# Patient Record
Sex: Female | Born: 1975
Health system: Southern US, Community
[De-identification: ages and names within clinical notes are randomized; demographics above are authoritative.]

## PROBLEM LIST (undated history)

## (undated) DIAGNOSIS — C439 Malignant melanoma of skin, unspecified: Secondary | ICD-10-CM

## (undated) DIAGNOSIS — E669 Obesity, unspecified: Secondary | ICD-10-CM

## (undated) DIAGNOSIS — M67441 Ganglion, right hand: Secondary | ICD-10-CM

## (undated) DIAGNOSIS — F314 Bipolar disorder, current episode depressed, severe, without psychotic features: Secondary | ICD-10-CM

## (undated) DIAGNOSIS — U071 COVID-19: Secondary | ICD-10-CM

## (undated) DIAGNOSIS — M170 Bilateral primary osteoarthritis of knee: Secondary | ICD-10-CM

## (undated) DIAGNOSIS — F329 Major depressive disorder, single episode, unspecified: Secondary | ICD-10-CM

## (undated) DIAGNOSIS — K047 Periapical abscess without sinus: Secondary | ICD-10-CM

## (undated) DIAGNOSIS — F32A Depression, unspecified: Secondary | ICD-10-CM

## (undated) DIAGNOSIS — L659 Nonscarring hair loss, unspecified: Secondary | ICD-10-CM

## (undated) DIAGNOSIS — G5722 Lesion of femoral nerve, left lower limb: Secondary | ICD-10-CM

## (undated) DIAGNOSIS — F4310 Post-traumatic stress disorder, unspecified: Secondary | ICD-10-CM

## (undated) DIAGNOSIS — M47816 Spondylosis without myelopathy or radiculopathy, lumbar region: Secondary | ICD-10-CM

## (undated) DIAGNOSIS — A749 Chlamydial infection, unspecified: Secondary | ICD-10-CM

## (undated) DIAGNOSIS — M549 Dorsalgia, unspecified: Secondary | ICD-10-CM

## (undated) DIAGNOSIS — R6883 Chills (without fever): Secondary | ICD-10-CM

## (undated) DIAGNOSIS — I10 Essential (primary) hypertension: Secondary | ICD-10-CM

## (undated) DIAGNOSIS — G43909 Migraine, unspecified, not intractable, without status migrainosus: Secondary | ICD-10-CM

## (undated) DIAGNOSIS — F419 Anxiety disorder, unspecified: Secondary | ICD-10-CM

## (undated) DIAGNOSIS — M7711 Lateral epicondylitis, right elbow: Secondary | ICD-10-CM

## (undated) DIAGNOSIS — G8929 Other chronic pain: Secondary | ICD-10-CM

## (undated) DIAGNOSIS — G894 Chronic pain syndrome: Secondary | ICD-10-CM

## (undated) DIAGNOSIS — N943 Premenstrual tension syndrome: Secondary | ICD-10-CM

## (undated) DIAGNOSIS — R87629 Unspecified abnormal cytological findings in specimens from vagina: Secondary | ICD-10-CM

## (undated) DIAGNOSIS — K219 Gastro-esophageal reflux disease without esophagitis: Secondary | ICD-10-CM

## (undated) DIAGNOSIS — R200 Anesthesia of skin: Secondary | ICD-10-CM

## (undated) DIAGNOSIS — A599 Trichomoniasis, unspecified: Secondary | ICD-10-CM

## (undated) DIAGNOSIS — C4491 Basal cell carcinoma of skin, unspecified: Secondary | ICD-10-CM

## (undated) DIAGNOSIS — Z8041 Family history of malignant neoplasm of ovary: Secondary | ICD-10-CM

## (undated) DIAGNOSIS — S52042A Displaced fracture of coronoid process of left ulna, initial encounter for closed fracture: Secondary | ICD-10-CM

## (undated) DIAGNOSIS — N939 Abnormal uterine and vaginal bleeding, unspecified: Secondary | ICD-10-CM

## (undated) DIAGNOSIS — R29898 Other symptoms and signs involving the musculoskeletal system: Secondary | ICD-10-CM

## (undated) DIAGNOSIS — J189 Pneumonia, unspecified organism: Secondary | ICD-10-CM

## (undated) DIAGNOSIS — N83209 Unspecified ovarian cyst, unspecified side: Secondary | ICD-10-CM

## (undated) DIAGNOSIS — IMO0002 Reserved for concepts with insufficient information to code with codable children: Secondary | ICD-10-CM

## (undated) DIAGNOSIS — M67439 Ganglion, unspecified wrist: Secondary | ICD-10-CM

## (undated) DIAGNOSIS — R0789 Other chest pain: Secondary | ICD-10-CM

## (undated) DIAGNOSIS — M1712 Unilateral primary osteoarthritis, left knee: Secondary | ICD-10-CM

## (undated) HISTORY — DX: Unspecified ovarian cyst, unspecified side: N83.209

## (undated) HISTORY — DX: Chlamydial infection, unspecified: A74.9

## (undated) HISTORY — DX: Other chronic pain: G89.29

## (undated) HISTORY — DX: Dorsalgia, unspecified: M54.9

## (undated) HISTORY — PX: OTHER SURGICAL HISTORY: SHX169

## (undated) HISTORY — DX: Post-traumatic stress disorder, unspecified: F43.10

## (undated) HISTORY — PX: TUBAL LIGATION: SHX77

## (undated) HISTORY — DX: Anxiety disorder, unspecified: F41.9

## (undated) HISTORY — DX: Major depressive disorder, single episode, unspecified: F32.9

## (undated) HISTORY — PX: TONSILLECTOMY: SUR1361

## (undated) HISTORY — DX: Pneumonia, unspecified organism: J18.9

## (undated) HISTORY — DX: Depression, unspecified: F32.A

## (undated) HISTORY — DX: Basal cell carcinoma of skin, unspecified: C44.91

## (undated) HISTORY — DX: Unspecified abnormal cytological findings in specimens from vagina: R87.629

## (undated) HISTORY — PX: TOTAL KNEE REVISION: SHX996

## (undated) HISTORY — PX: ADENOIDECTOMY: SUR15

## (undated) HISTORY — DX: Trichomoniasis, unspecified: A59.9

## (undated) HISTORY — DX: Reserved for concepts with insufficient information to code with codable children: IMO0002

## (undated) HISTORY — DX: Bilateral primary osteoarthritis of knee: M17.0

## (undated) HISTORY — PX: ABLATION: SHX5711

## (undated) HISTORY — PX: WISDOM TOOTH EXTRACTION: SHX21

---
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
## 1994-10-16 HISTORY — PX: APPENDECTOMY: SHX54

## 1996-10-16 HISTORY — PX: DILATION AND CURETTAGE OF UTERUS: SHX78

## 1997-10-16 DIAGNOSIS — IMO0002 Reserved for concepts with insufficient information to code with codable children: Secondary | ICD-10-CM

## 1997-10-16 DIAGNOSIS — R87619 Unspecified abnormal cytological findings in specimens from cervix uteri: Secondary | ICD-10-CM

## 1997-10-16 HISTORY — DX: Reserved for concepts with insufficient information to code with codable children: IMO0002

## 1997-10-16 HISTORY — DX: Unspecified abnormal cytological findings in specimens from cervix uteri: R87.619

## 1997-10-16 HISTORY — PX: TUBAL LIGATION: SHX77

## 2009-10-16 DIAGNOSIS — M170 Bilateral primary osteoarthritis of knee: Secondary | ICD-10-CM

## 2009-10-16 HISTORY — DX: Bilateral primary osteoarthritis of knee: M17.0

## 2011-10-17 HISTORY — PX: OTHER SURGICAL HISTORY: SHX169

## 2012-03-05 ENCOUNTER — Encounter (HOSPITAL_COMMUNITY): Payer: Self-pay | Admitting: Psychiatry

## 2012-03-05 ENCOUNTER — Ambulatory Visit (INDEPENDENT_AMBULATORY_CARE_PROVIDER_SITE_OTHER): Payer: BC Managed Care – PPO | Admitting: Psychiatry

## 2012-03-05 VITALS — BP 135/85 | HR 80 | Ht 64.0 in | Wt 190.0 lb

## 2012-03-05 DIAGNOSIS — F331 Major depressive disorder, recurrent, moderate: Secondary | ICD-10-CM

## 2012-03-05 DIAGNOSIS — F4310 Post-traumatic stress disorder, unspecified: Secondary | ICD-10-CM

## 2012-03-05 MED ORDER — TRAZODONE HCL 50 MG PO TABS: ORAL_TABLET | ORAL | Status: DC
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
## 2012-03-09 DIAGNOSIS — F331 Major depressive disorder, recurrent, moderate: Secondary | ICD-10-CM | POA: Insufficient documentation

## 2012-03-09 DIAGNOSIS — F4310 Post-traumatic stress disorder, unspecified: Secondary | ICD-10-CM | POA: Insufficient documentation

## 2012-03-09 HISTORY — DX: Post-traumatic stress disorder, unspecified: F43.10

## 2012-03-13 ENCOUNTER — Telehealth (HOSPITAL_COMMUNITY): Payer: Self-pay

## 2012-03-13 DIAGNOSIS — F329 Major depressive disorder, single episode, unspecified: Secondary | ICD-10-CM

## 2012-03-13 MED ORDER — VENLAFAXINE HCL 75 MG PO TABS: ORAL_TABLET | ORAL | Status: DC

## 2012-03-13 NOTE — Telephone Encounter (Signed)
Will send medications in as follows:  a) Venalfaxine Hcl 75mg  Two tablet in the morning and one tablet at 2PM  b) Trazodone 50 mg- one half to two tablets daily for insomnia,

## 2012-03-21 ENCOUNTER — Telehealth (HOSPITAL_COMMUNITY): Payer: Self-pay

## 2012-03-21 NOTE — Telephone Encounter (Signed)
Called patient back. Will try again in the morning.

## 2012-03-21 NOTE — Telephone Encounter (Signed)
Called patient left message will calll in Am.

## 2012-03-22 NOTE — Telephone Encounter (Signed)
The patient reports she started getting angrier with the increase of venlafaxine. She denies any current stressors. The patient denies any SI/HI/AVH.   PLAN:  1. Asked the patient to decrease her dosage of effexor to 75 mg, Two tablets in the morning. 2. Asked patient to call on Monday.

## 2012-04-08 ENCOUNTER — Encounter (HOSPITAL_COMMUNITY): Payer: Self-pay | Admitting: Psychiatry

## 2012-04-08 ENCOUNTER — Ambulatory Visit (INDEPENDENT_AMBULATORY_CARE_PROVIDER_SITE_OTHER): Payer: BC Managed Care – PPO | Admitting: Psychiatry

## 2012-04-08 VITALS — BP 127/97 | HR 75 | Ht 64.0 in | Wt 188.0 lb

## 2012-04-08 DIAGNOSIS — F4310 Post-traumatic stress disorder, unspecified: Secondary | ICD-10-CM

## 2012-04-08 DIAGNOSIS — F329 Major depressive disorder, single episode, unspecified: Secondary | ICD-10-CM

## 2012-04-08 MED ORDER — LAMOTRIGINE 25 MG PO TABS: 25.0000 mg | ORAL_TABLET | Freq: Every day | ORAL | Status: DC

## 2012-04-08 MED ORDER — VENLAFAXINE HCL 75 MG PO TABS: 150.0000 mg | ORAL_TABLET | Freq: Every morning | ORAL | Status: DC

## 2012-04-08 MED ORDER — TRAZODONE HCL 50 MG PO TABS: ORAL_TABLET | ORAL | Status: DC
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
## 2012-05-02 ENCOUNTER — Ambulatory Visit (INDEPENDENT_AMBULATORY_CARE_PROVIDER_SITE_OTHER): Payer: BC Managed Care – PPO | Admitting: Obstetrics & Gynecology

## 2012-05-02 ENCOUNTER — Encounter: Payer: Self-pay | Admitting: Obstetrics & Gynecology

## 2012-05-02 VITALS — BP 137/77 | HR 65 | Temp 98.3°F | Resp 16 | Ht 64.0 in | Wt 180.0 lb

## 2012-05-02 DIAGNOSIS — N939 Abnormal uterine and vaginal bleeding, unspecified: Secondary | ICD-10-CM | POA: Insufficient documentation

## 2012-05-02 DIAGNOSIS — Z8041 Family history of malignant neoplasm of ovary: Secondary | ICD-10-CM

## 2012-05-02 DIAGNOSIS — Z01812 Encounter for preprocedural laboratory examination: Secondary | ICD-10-CM

## 2012-05-02 DIAGNOSIS — N926 Irregular menstruation, unspecified: Secondary | ICD-10-CM

## 2012-05-02 HISTORY — DX: Family history of malignant neoplasm of ovary: Z80.41

## 2012-05-02 HISTORY — DX: Abnormal uterine and vaginal bleeding, unspecified: N93.9

## 2012-05-02 NOTE — Patient Instructions (Signed)

## 2012-05-02 NOTE — Progress Notes (Signed)
  Subjective:     Evelyn Sanchez is an 36 y.o. woman who presents for irregular menses. She had been bleeding irregularly and after intercourse. She is now bleeding every 7-10 days twice a month for the past 5 months. Dysmenorrhea:moderate, occurring first 4 days of a 10 day menses. Cyclic symptoms include: breast tenderness, depression and irritability. Current contraception: tubal ligation. History of infertility: no. History of abnormal Pap smear: yes - colpo done, no cryo or LEEP.  Menstrual History: OB History    Grav Para Term Preterm Abortions TAB SAB Ect Mult Living   4 2 2  2  2   2       Menarche age: 75 Patient's last menstrual period was 04/18/2012.    The following portions of the patient's history were reviewed and updated as appropriate: allergies, current medications, past family history, past medical history, past social history, past surgical history and problem list.  Review of Systems Pertinent items are noted in HPI.    Objective:    BP 137/77  Pulse 65  Temp 98.3 F (36.8 C) (Oral)  Resp 16  Ht 5\' 4"  (1.626 m)  Wt 180 lb (81.647 kg)  BMI 30.90 kg/m2  LMP 04/18/2012  General:   alert, cooperative and no distress  Skin:    normal and no rash or abnormalities  Neck:  no adenopathy, supple, symmetrical, trachea midline and thyroid not enlarged, symmetric, no tenderness/mass/nodules  Abdomen:  soft, non-tender; bowel sounds normal; no masses,  no organomegaly  Pelvic:   cervix normal in appearance, external genitalia normal, no adnexal masses or tenderness, no cervical motion tenderness, perianal skin: no external genital warts noted, urethra without abnormality or discharge, uterus normal size, shape, and consistency and vagina normal without discharge     Assessment:    dysfunctional uterine bleeding    Plan:    Blood tests: TSH. Diagnosis explained in detail, including differential. Pelvic ultrasound.  Prolactin, CBC RTC 2 weeks  ENDOMETRIAL BIOPSY       The indications for endometrial biopsy were reviewed.   Risks of the biopsy including cramping, bleeding, infection, uterine perforation, inadequate specimen and need for additional procedures  were discussed. The patient states she understands and agrees to undergo procedure today. Consent was signed. Time out was performed. Urine HCG was negative. A sterile speculum was placed in the patient's vagina and the cervix was prepped with Betadine. A single-toothed tenaculum was placed on the anterior lip of the cervix to stabilize it. The 3 mm pipelle was introduced into the endometrial cavity without difficulty to a depth of 9 cm, and a moderate amount of tissue was obtained and sent to pathology. The instruments were removed from the patient's vagina. Minimal bleeding from the cervix was noted. The patient tolerated the procedure well. Routine post-procedure instructions were given to the patient. The patient will follow up to review the results and for further management.

## 2012-05-03 ENCOUNTER — Ambulatory Visit (INDEPENDENT_AMBULATORY_CARE_PROVIDER_SITE_OTHER): Payer: BC Managed Care – PPO

## 2012-05-03 ENCOUNTER — Encounter (HOSPITAL_COMMUNITY): Payer: Self-pay | Admitting: Psychiatry

## 2012-05-03 ENCOUNTER — Telehealth: Payer: Self-pay | Admitting: *Deleted

## 2012-05-03 ENCOUNTER — Ambulatory Visit (INDEPENDENT_AMBULATORY_CARE_PROVIDER_SITE_OTHER): Payer: BC Managed Care – PPO | Admitting: Psychiatry

## 2012-05-03 VITALS — BP 139/82 | HR 72 | Ht 64.0 in | Wt 190.0 lb

## 2012-05-03 DIAGNOSIS — N939 Abnormal uterine and vaginal bleeding, unspecified: Secondary | ICD-10-CM

## 2012-05-03 DIAGNOSIS — F4310 Post-traumatic stress disorder, unspecified: Secondary | ICD-10-CM

## 2012-05-03 DIAGNOSIS — N949 Unspecified condition associated with female genital organs and menstrual cycle: Secondary | ICD-10-CM

## 2012-05-03 DIAGNOSIS — F329 Major depressive disorder, single episode, unspecified: Secondary | ICD-10-CM

## 2012-05-03 DIAGNOSIS — N938 Other specified abnormal uterine and vaginal bleeding: Secondary | ICD-10-CM

## 2012-05-03 LAB — CBC
HCT: 41.2 % (ref 36.0–46.0)
Hemoglobin: 13.6 g/dL (ref 12.0–15.0)
WBC: 7.5 10*3/uL (ref 4.0–10.5)

## 2012-05-03 LAB — PROLACTIN: Prolactin: 10 ng/mL

## 2012-05-03 MED ORDER — TRAZODONE HCL 100 MG PO TABS: ORAL_TABLET | ORAL | Status: DC

## 2012-05-03 MED ORDER — VENLAFAXINE HCL ER 150 MG PO CP24: 150.0000 mg | ORAL_CAPSULE | Freq: Every day | ORAL | Status: DC

## 2012-05-03 MED ORDER — LAMOTRIGINE 100 MG PO TABS: 100.0000 mg | ORAL_TABLET | Freq: Every day | ORAL | Status: DC

## 2012-05-03 NOTE — Telephone Encounter (Signed)
Pt notified of labs results that were normal.
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
## 2012-05-23 ENCOUNTER — Ambulatory Visit (INDEPENDENT_AMBULATORY_CARE_PROVIDER_SITE_OTHER): Payer: BC Managed Care – PPO | Admitting: Obstetrics & Gynecology

## 2012-05-23 ENCOUNTER — Encounter: Payer: Self-pay | Admitting: Obstetrics & Gynecology

## 2012-05-23 ENCOUNTER — Ambulatory Visit: Payer: Self-pay | Admitting: Obstetrics & Gynecology

## 2012-05-23 VITALS — BP 127/84 | HR 72 | Ht 64.0 in | Wt 187.0 lb

## 2012-05-23 DIAGNOSIS — N920 Excessive and frequent menstruation with regular cycle: Secondary | ICD-10-CM

## 2012-05-23 DIAGNOSIS — R300 Dysuria: Secondary | ICD-10-CM

## 2012-05-23 LAB — POCT URINALYSIS DIPSTICK
Glucose, UA: NEGATIVE
Nitrite, UA: NEGATIVE
Urobilinogen, UA: 0.2

## 2012-05-23 NOTE — Progress Notes (Signed)
  Subjective:    Patient ID: Evelyn Sanchez, female    DOB: 1976-02-16, 36 y.o.   MRN: [id number]  HPI  Pt presents for continued bleeding 2x month.  Pt had endometrial biopsy which was nml.  TV US which showed a small amount of fluid in the canal, otherwise nml.  Nmls TSH, Prolactin, and CBC.  Pt has tried OCPs which worsened her headaches.  She has also tried Depo and the IUD which she said did not work.  Pt interested in ablation and was given details of the procedure.  Review of Systems  Genitourinary: Positive for vaginal bleeding.  Musculoskeletal: Positive for back pain and arthralgias.       Objective:   Physical Exam  Vitals reviewed. Constitutional: She is oriented to person, place, and time. She appears well-developed and well-nourished.  HENT:  Head: Normocephalic and atraumatic.  Eyes: Conjunctivae are normal.  Pulmonary/Chest: Effort normal.  Abdominal: Soft.  Neurological: She is alert and oriented to person, place, and time.  Skin: Skin is warm and dry.  Psychiatric: She has a normal mood and affect.          Assessment & Plan:  36 year old female with metrorrhagia that has not responded to OCPs, depo, or IUD (these were tried by another provider).  Pt interested in hydrothermal ablation.  Pt consented for D & C, hysteroscopy, and hydrothermal endometrial ablation.  Risks include but not limited to bleeding, infection, damage to uterus, burn in the vagina.    Will schedule in the next month.

## 2012-05-23 NOTE — Progress Notes (Signed)
Here for ultrasound/blood/biopsy results.

## 2012-05-23 NOTE — Patient Instructions (Signed)
Endometrial Ablation Endometrial ablation removes the lining of the uterus (endometrium). It is usually a same day, outpatient treatment. Ablation helps avoid major surgery (such as a hysterectomy). A hysterectomy is removal of the cervix and uterus. Endometrial ablation has less risk and complications, has a shorter recovery period and is less expensive. After endometrial ablation, most women will have little or no menstrual bleeding. You may not keep your fertility. Pregnancy is no longer likely after this procedure but if you are pre-menopausal, you still need to use a reliable method of birth control following the procedure because pregnancy can occur. REASONS TO HAVE THE PROCEDURE MAY INCLUDE:  Heavy periods.   Bleeding that is causing anemia.   Anovulatory bleeding, very irregular, bleeding.   Bleeding submucous fibroids (on the lining inside the uterus) if they are smaller than 3 centimeters.  REASONS NOT TO HAVE THE PROCEDURE MAY INCLUDE:  You wish to have more children.   You have a pre-cancerous or cancerous problem. The cause of any abnormal bleeding must be diagnosed before having the procedure.   You have pain coming from the uterus.   You have a submucus fibroid larger than 3 centimeters.   You recently had a baby.   You recently had an infection in the uterus.   You have a severe retro-flexed, tipped uterus and cannot insert the instrument to do the ablation.   You had a Cesarean section or deep major surgery on the uterus.   The inner cavity of the uterus is too large for the endometrial ablation instrument.  RISKS AND COMPLICATIONS   Perforation of the uterus.   Bleeding.   Infection of the uterus, bladder or vagina.   Injury to surrounding organs.   Cutting the cervix.   An air bubble to the lung (air embolus).   Pregnancy following the procedure.   Failure of the procedure to help the problem requiring hysterectomy.   Decreased ability to diagnose  cancer in the lining of the uterus.  BEFORE THE PROCEDURE  The lining of the uterus must be tested to make sure there is no pre-cancerous or cancer cells present.   Medications may be given to make the lining of the uterus thinner.   Ultrasound may be used to evaluate the size and look for abnormalities of the uterus.   Future pregnancy is not desired.  PROCEDURE  There are different ways to destroy the lining of the uterus.   Resectoscope - radio frequency-alternating electric current is the most common one used.   Cryotherapy - freezing the lining of the uterus.   Heated Free Liquid - heated salt (saline) solution inserted into the uterus.   Microwave - uses high energy microwaves in the uterus.   Thermal Balloon - a catheter with a balloon tip is inserted into the uterus and filled with heated fluid.  Your caregiver will talk with you about the method used in this clinic. They will also instruct you on the pros and cons of the procedure. Endometrial ablation is performed along with a procedure called operative hysteroscopy. A narrow viewing tube is inserted through the birth canal (vagina) and through the cervix into the uterus. A tiny camera attached to the viewing tube (hysteroscope) allows the uterine cavity to be shown on a TV monitor during surgery. Your uterus is filled with a harmless liquid to make the procedure easier. The lining of the uterus is then removed. The lining can also be removed with a resectoscope which allows your surgeon   to cut away the lining of the uterus under direct vision. Usually, you will be able to go home within an hour after the procedure. HOME CARE INSTRUCTIONS   Do not drive for 24 hours.   No tampons, douching or intercourse for 2 weeks or until your caregiver approves.   Rest at home for 24 to 48 hours. You may then resume normal activities unless told differently by your caregiver.   Take your temperature two times a day for 4 days, and record  it.   Take any medications your caregiver has ordered, as directed.   Use some form of contraception if you are pre-menopausal and do not want to get pregnant.  Bleeding after the procedure is normal. It varies from light spotting and mildly watery to bloody discharge for 4 to 6 weeks. You may also have mild cramping. Only take over-the-counter or prescription medicines for pain, discomfort, or fever as directed by your caregiver. Do not use aspirin, as this may aggravate bleeding. Frequent urination during the first 24 hours is normal. You will not know how effective your surgery is until at least 3 months after the surgery. SEEK IMMEDIATE MEDICAL CARE IF:   Bleeding is heavier than a normal menstrual cycle.   An oral temperature above 102 F (38.9 C) develops.   You have increasing cramps or pains not relieved with medication or develop belly (abdominal) pain which does not seem to be related to the same area of earlier cramping and pain.   You are light headed, weak or have fainting episodes.   You develop pain in the shoulder strap areas.   You have chest or leg pain.   You have abnormal vaginal discharge.   You have painful urination.  Document Released: 08/11/2004 Document Revised: 09/21/2011 Document Reviewed: 11/09/2007 Exit[employer] Patient Information 2012 [employer]. 

## 2012-05-25 LAB — URINE CULTURE: Colony Count: 10000

## 2012-05-30 ENCOUNTER — Encounter (HOSPITAL_COMMUNITY): Payer: Self-pay | Admitting: Psychiatry

## 2012-05-30 ENCOUNTER — Ambulatory Visit (INDEPENDENT_AMBULATORY_CARE_PROVIDER_SITE_OTHER): Payer: BC Managed Care – PPO | Admitting: Psychiatry

## 2012-05-30 VITALS — BP 111/89 | HR 66 | Ht 64.0 in | Wt 190.0 lb

## 2012-05-30 DIAGNOSIS — F4310 Post-traumatic stress disorder, unspecified: Secondary | ICD-10-CM

## 2012-05-30 DIAGNOSIS — F329 Major depressive disorder, single episode, unspecified: Secondary | ICD-10-CM

## 2012-05-30 MED ORDER — TRAZODONE HCL 150 MG PO TABS: 150.0000 mg | ORAL_TABLET | Freq: Every day | ORAL | Status: DC

## 2012-05-30 MED ORDER — LAMOTRIGINE 100 MG PO TABS: 100.0000 mg | ORAL_TABLET | Freq: Every day | ORAL | Status: DC

## 2012-05-30 MED ORDER — LAMOTRIGINE 25 MG PO TABS: ORAL_TABLET | ORAL | Status: DC

## 2012-05-30 MED ORDER — VENLAFAXINE HCL ER 150 MG PO CP24: 150.0000 mg | ORAL_CAPSULE | Freq: Every day | ORAL | Status: DC
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
## 2012-06-27 ENCOUNTER — Ambulatory Visit (HOSPITAL_COMMUNITY): Payer: Self-pay | Admitting: Psychiatry

## 2012-07-04 ENCOUNTER — Ambulatory Visit (INDEPENDENT_AMBULATORY_CARE_PROVIDER_SITE_OTHER): Payer: BC Managed Care – PPO | Admitting: Psychiatry

## 2012-07-04 ENCOUNTER — Encounter (HOSPITAL_COMMUNITY): Payer: Self-pay | Admitting: Psychiatry

## 2012-07-04 VITALS — BP 124/74 | HR 66 | Ht 64.0 in | Wt 188.0 lb

## 2012-07-04 DIAGNOSIS — F329 Major depressive disorder, single episode, unspecified: Secondary | ICD-10-CM

## 2012-07-04 DIAGNOSIS — F4310 Post-traumatic stress disorder, unspecified: Secondary | ICD-10-CM

## 2012-07-04 MED ORDER — VENLAFAXINE HCL ER 150 MG PO CP24: 150.0000 mg | ORAL_CAPSULE | Freq: Every day | ORAL | Status: DC

## 2012-07-04 MED ORDER — TRAZODONE HCL 100 MG PO TABS: 150.0000 mg | ORAL_TABLET | Freq: Every day | ORAL | Status: DC

## 2012-07-04 MED ORDER — LAMOTRIGINE 25 MG PO TABS: ORAL_TABLET | ORAL | Status: DC

## 2012-07-04 MED ORDER — LITHIUM CARBONATE 150 MG PO CAPS: ORAL_CAPSULE | ORAL | Status: DC
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
## 2012-07-08 ENCOUNTER — Telehealth (HOSPITAL_COMMUNITY): Payer: Self-pay

## 2012-07-08 NOTE — Telephone Encounter (Signed)
Called patient. Advised patient to continue to take all medications up to the day of surgery.

## 2012-07-08 NOTE — Telephone Encounter (Signed)
HAVING SURGERY ON October 14TH READ SOMEWHERE THAT SHOULD STOP TAKING 14 DAYS BEFORE SURGERY. WONDERING IF SHOULD STOP TAKING IT.

## 2012-07-11 ENCOUNTER — Encounter (HOSPITAL_COMMUNITY): Payer: Self-pay | Admitting: Pharmacist

## 2012-07-22 ENCOUNTER — Encounter (HOSPITAL_COMMUNITY)
Admission: RE | Admit: 2012-07-22 | Discharge: 2012-07-22 | Disposition: A | Discharge status: Home / Self Care | Payer: BC Managed Care – PPO | Source: Ambulatory Visit | Attending: Obstetrics & Gynecology | Admitting: Obstetrics & Gynecology

## 2012-07-22 ENCOUNTER — Encounter (HOSPITAL_COMMUNITY): Payer: Self-pay

## 2012-07-22 LAB — CBC
HCT: 42.8 % (ref 36.0–46.0)
Hemoglobin: 14.1 g/dL (ref 12.0–15.0)
MCHC: 32.9 g/dL (ref 30.0–36.0)
RBC: 5.11 MIL/uL (ref 3.87–5.11)
WBC: 10.2 10*3/uL (ref 4.0–10.5)

## 2012-07-22 NOTE — Patient Instructions (Addendum)
19 Evelyn Sanchez  07/22/2012   Your procedure is scheduled on:  07/29/12  Enter through the Main Entrance of [facility] at 1130 AM.  Pick up the phone at the desk and dial [phone].   Call this number if you have problems the morning of surgery: [phone]   Remember:   Do not eat food:After Midnight.  Do not drink clear liquids: 4 Hours before arrival.  Take these medicines the morning of surgery with A SIP OF WATER: Lithium, Lamictal, Effexor   Do not wear jewelry, make-up or nail polish.  Do not wear lotions, powders, or perfumes. You may wear deodorant.  Do not shave 48 hours prior to surgery.  Do not bring valuables to the hospital.  Contacts, dentures or bridgework may not be worn into surgery.  Leave suitcase in the car. After surgery it may be brought to your room.  For patients admitted to the hospital, checkout time is 11:00 AM the day of discharge.   Patients discharged the day of surgery will not be allowed to drive home.  Name and phone number of your driver: husband  [name]  Special Instructions: Shower using CHG 2 nights before surgery and the night before surgery.  If you shower the day of surgery use CHG.  Use special wash - you have one bottle of CHG for all showers.  You should use approximately 1/3 of the bottle for each shower.   Please read over the following fact sheets that you were given: Surgical Site Infection Prevention

## 2012-07-29 ENCOUNTER — Encounter (HOSPITAL_COMMUNITY): Payer: Self-pay | Admitting: *Deleted

## 2012-07-29 ENCOUNTER — Encounter (HOSPITAL_COMMUNITY): Payer: Self-pay | Admitting: Anesthesiology

## 2012-07-29 ENCOUNTER — Ambulatory Visit (HOSPITAL_COMMUNITY)
Admission: RE | Admit: 2012-07-29 | Discharge: 2012-07-29 | Disposition: A | Discharge status: Home / Self Care | Payer: BC Managed Care – PPO | Source: Ambulatory Visit | Attending: Obstetrics & Gynecology | Admitting: Obstetrics & Gynecology

## 2012-07-29 ENCOUNTER — Ambulatory Visit (HOSPITAL_COMMUNITY): Payer: BC Managed Care – PPO | Admitting: Anesthesiology

## 2012-07-29 ENCOUNTER — Encounter (HOSPITAL_COMMUNITY)
Admission: RE | Disposition: A | Discharge status: Home / Self Care | Payer: Self-pay | Source: Ambulatory Visit | Attending: Obstetrics & Gynecology

## 2012-07-29 DIAGNOSIS — Z01812 Encounter for preprocedural laboratory examination: Secondary | ICD-10-CM

## 2012-07-29 DIAGNOSIS — N921 Excessive and frequent menstruation with irregular cycle: Secondary | ICD-10-CM | POA: Insufficient documentation

## 2012-07-29 DIAGNOSIS — Z01818 Encounter for other preprocedural examination: Secondary | ICD-10-CM | POA: Insufficient documentation

## 2012-07-29 SURGERY — DILATATION & CURETTAGE/HYSTEROSCOPY WITH HYDROTHERMAL ABLATION
Anesthesia: General | Site: Vagina | Wound class: Clean Contaminated

## 2012-07-29 MED ORDER — METOCLOPRAMIDE HCL 5 MG/ML IJ SOLN: 10.0000 mg | Freq: Once | INTRAMUSCULAR | Status: DC | PRN

## 2012-07-29 MED ORDER — MIDAZOLAM HCL 2 MG/2ML IJ SOLN
INTRAMUSCULAR | Status: AC
  Filled 2012-07-29: qty 2

## 2012-07-29 MED ORDER — FENTANYL CITRATE 0.05 MG/ML IJ SOLN
INTRAMUSCULAR | Status: AC
  Administered 2012-07-29: 50 ug via INTRAVENOUS
  Filled 2012-07-29: qty 2

## 2012-07-29 MED ORDER — DEXAMETHASONE SODIUM PHOSPHATE 10 MG/ML IJ SOLN
INTRAMUSCULAR | Status: AC
  Filled 2012-07-29: qty 1

## 2012-07-29 MED ORDER — LACTATED RINGERS IV SOLN
INTRAVENOUS | Status: DC
  Administered 2012-07-29: 100 mL/h via INTRAVENOUS

## 2012-07-29 MED ORDER — MIDAZOLAM HCL 5 MG/5ML IJ SOLN
INTRAMUSCULAR | Status: DC | PRN
  Administered 2012-07-29: 2 mg via INTRAVENOUS

## 2012-07-29 MED ORDER — FENTANYL CITRATE 0.05 MG/ML IJ SOLN
25.0000 ug | INTRAMUSCULAR | Status: DC | PRN
  Administered 2012-07-29 (×2): 50 ug via INTRAVENOUS

## 2012-07-29 MED ORDER — KETOROLAC TROMETHAMINE 30 MG/ML IJ SOLN
INTRAMUSCULAR | Status: AC
  Filled 2012-07-29: qty 1

## 2012-07-29 MED ORDER — IBUPROFEN 200 MG PO TABS: 600.0000 mg | ORAL_TABLET | Freq: Four times a day (QID) | ORAL | Status: DC | PRN

## 2012-07-29 MED ORDER — PROPOFOL 10 MG/ML IV EMUL
INTRAVENOUS | Status: AC
  Filled 2012-07-29: qty 20

## 2012-07-29 MED ORDER — PROPOFOL 10 MG/ML IV EMUL
INTRAVENOUS | Status: DC | PRN
  Administered 2012-07-29: 200 mg via INTRAVENOUS

## 2012-07-29 MED ORDER — LIDOCAINE HCL (CARDIAC) 20 MG/ML IV SOLN
INTRAVENOUS | Status: AC
  Filled 2012-07-29: qty 5

## 2012-07-29 MED ORDER — LIDOCAINE HCL (CARDIAC) 20 MG/ML IV SOLN
INTRAVENOUS | Status: DC | PRN
  Administered 2012-07-29: 30 mg via INTRAVENOUS

## 2012-07-29 MED ORDER — FENTANYL CITRATE 0.05 MG/ML IJ SOLN
INTRAMUSCULAR | Status: DC | PRN
  Administered 2012-07-29: 50 ug via INTRAVENOUS
  Administered 2012-07-29: 100 ug via INTRAVENOUS
  Administered 2012-07-29: 50 ug via INTRAVENOUS

## 2012-07-29 MED ORDER — ONDANSETRON HCL 4 MG/2ML IJ SOLN
INTRAMUSCULAR | Status: DC | PRN
  Administered 2012-07-29: 4 mg via INTRAVENOUS

## 2012-07-29 MED ORDER — LACTATED RINGERS IV SOLN
INTRAVENOUS | Status: DC | PRN
  Administered 2012-07-29: 13:00:00 via INTRAVENOUS

## 2012-07-29 MED ORDER — KETOROLAC TROMETHAMINE 30 MG/ML IJ SOLN
INTRAMUSCULAR | Status: DC | PRN
  Administered 2012-07-29: 30 mg via INTRAVENOUS

## 2012-07-29 MED ORDER — OXYCODONE-ACETAMINOPHEN 5-325 MG PO TABS
ORAL_TABLET | ORAL | Status: AC
  Filled 2012-07-29: qty 1

## 2012-07-29 MED ORDER — FENTANYL CITRATE 0.05 MG/ML IJ SOLN
INTRAMUSCULAR | Status: AC
  Filled 2012-07-29: qty 2

## 2012-07-29 MED ORDER — DEXAMETHASONE SODIUM PHOSPHATE 10 MG/ML IJ SOLN
INTRAMUSCULAR | Status: DC | PRN
  Administered 2012-07-29: 10 mg via INTRAVENOUS

## 2012-07-29 MED ORDER — OXYCODONE-ACETAMINOPHEN 5-325 MG PO TABS
1.0000 | ORAL_TABLET | Freq: Once | ORAL | Status: AC
  Administered 2012-07-29: 1 via ORAL

## 2012-07-29 MED ORDER — ONDANSETRON HCL 4 MG/2ML IJ SOLN
INTRAMUSCULAR | Status: AC
  Filled 2012-07-29: qty 2

## 2012-07-29 SURGICAL SUPPLY — 16 items
CATH ROBINSON RED A/P 16FR (CATHETERS) ×2 IMPLANT
CLOTH BEACON ORANGE TIMEOUT ST (SAFETY) ×2 IMPLANT
CONTAINER PREFILL 10% NBF 60ML (FORM) ×4 IMPLANT
COVER MAYO STAND STRL (DRAPES) ×2 IMPLANT
DRESSING TELFA 8X3 (GAUZE/BANDAGES/DRESSINGS) ×2 IMPLANT
ELECT REM PT RETURN 9FT ADLT (ELECTROSURGICAL)
ELECTRODE REM PT RTRN 9FT ADLT (ELECTROSURGICAL) IMPLANT
GLOVE BIO SURGEON STRL SZ7 (GLOVE) ×2 IMPLANT
GLOVE BIOGEL PI IND STRL 7.0 (GLOVE) ×2 IMPLANT
GLOVE BIOGEL PI INDICATOR 7.0 (GLOVE) ×2
GOWN STRL REIN XL XLG (GOWN DISPOSABLE) ×4 IMPLANT
NS IRRIG 1000ML POUR BTL (IV SOLUTION) ×2 IMPLANT
PACK VAGINAL MINOR WOMEN LF (CUSTOM PROCEDURE TRAY) ×2 IMPLANT
PAD OB MATERNITY 4.3X12.25 (PERSONAL CARE ITEMS) ×2 IMPLANT
SET GENESYS HTA PROCERVA (MISCELLANEOUS) ×2 IMPLANT
TOWEL OR 17X24 6PK STRL BLUE (TOWEL DISPOSABLE) ×4 IMPLANT

## 2012-07-29 NOTE — Anesthesia Postprocedure Evaluation (Signed)
Anesthesia Post Note  Patient: Evelyn Sanchez  Procedure(s) Performed: Procedure(s) (LRB): DILATATION & CURETTAGE/HYSTEROSCOPY WITH HYDROTHERMAL ABLATION (N/A)  Anesthesia type: General  Patient location: PACU  Post pain: Pain level controlled  Post assessment: Post-op Vital signs reviewed  Last Vitals:  Filed Vitals:   07/29/12 1415  BP: 129/87  Pulse: 79  Temp:   Resp: 16    Post vital signs: Reviewed  Level of consciousness: sedated  Complications: No apparent anesthesia complicationsfj

## 2012-07-29 NOTE — Transfer of Care (Signed)
Immediate Anesthesia Transfer of Care Note  Patient: Evelyn Sanchez  Procedure(s) Performed: Procedure(s) (LRB) with comments: DILATATION & CURETTAGE/HYSTEROSCOPY WITH HYDROTHERMAL ABLATION (N/A)  Patient Location: PACU  Anesthesia Type: General  Level of Consciousness: awake  Airway & Oxygen Therapy: Patient Spontanous Breathing  Post-op Assessment: Report given to PACU RN and Patient moving all extremities X 4  Post vital signs: Reviewed and stable  Complications: No apparent anesthesia complications

## 2012-07-29 NOTE — Transfer of Care (Deleted)
Immediate Anesthesia Transfer of Care Note  Patient: Evelyn Sanchez  Procedure(s) Performed: Procedure(s) (LRB) with comments: DILATATION & CURETTAGE/HYSTEROSCOPY WITH HYDROTHERMAL ABLATION (N/A)  Patient Location: PACU  Anesthesia Type: General  Level of Consciousness: awake, alert , oriented and patient cooperative  Airway & Oxygen Therapy: Patient Spontanous Breathing and Patient connected to nasal cannula oxygen  Post-op Assessment: Report given to PACU RN, Post -op Vital signs reviewed and stable and Patient moving all extremities X 4  Post vital signs: Reviewed and stable  Complications: No apparent anesthesia complications

## 2012-07-29 NOTE — H&P (Signed)
Evelyn Sanchez is an 36 y.o. female who presents for continued bleeding 2x month. Pt had endometrial biopsy which was nml. TV US which showed a small amount of fluid in the canal, otherwise nml. Nmls TSH and Prolactin. Pt has tried OCPs which worsened her headaches. She has also tried Depo and the IUD which she said did not work.  Pt desires further therapy for her menorrhagia.   Menstrual History:   Past Medical History  Diagnosis Date  . Arthritis of both knees 2011  . Depression   . Anxiety   . Pneumonia   . Chlamydia   . Trichomonas   . Abnormal pap 1999    colpo  . Ovarian cyst     Past Surgical History  Procedure Date  . Tonsilectomy 1984  . Tuboplasty / tubotubal anastomosis 1980    Four times between 1980-1990  . Tubal ligation 1999  . Appendectomy 1996  . Knee arthroscopy 1991    Left Kneex2  . Tonsillectomy     Family History  Problem Relation Age of Onset  . Hypertension Mother   . Depression Mother   . Diabetes type II Mother   . Heart attack Father   . Hypertension Father   . Diabetes type II Father   . Depression Sister   . Diabetes type II Sister   . Hypertension Maternal Grandfather   . Hypertension Maternal Grandmother   . Hypertension Paternal Grandfather   . Hypertension Paternal Grandmother     Social History:  reports that she quit smoking about 2 years ago. Her smoking use included Cigarettes. She has a 5 pack-year smoking history. She has never used smokeless tobacco. She reports that she does not drink alcohol or use illicit drugs.  Allergies:  Allergies  Allergen Reactions  . Zolpidem Other (See Comments)    Hallucinations of "little people"    No prescriptions prior to admission    Review of Systems  Constitutional: Negative.   Respiratory: Negative.   Cardiovascular: Negative.   Gastrointestinal: Negative.   Genitourinary: Negative.   Musculoskeletal: Negative.     Filed Vitals:   07/29/12 1153  BP: 131/82  Pulse: 66    Temp: 98.6 F (37 C)  TempSrc: Oral  Resp: 20  SpO2: 99%   Physical Exam  Vitals reviewed. Constitutional: She is oriented to person, place, and time. She appears well-developed and well-nourished. No distress.  HENT:  Head: Normocephalic and atraumatic.  Mouth/Throat: No oropharyngeal exudate.  Eyes: Conjunctivae normal are normal.  Neck: Neck supple.  Cardiovascular: Normal rate and regular rhythm.   Respiratory: Breath sounds normal. She has no wheezes. She has no rales.  GI: Soft. She exhibits no distension. There is tenderness. There is guarding. There is no rebound.  Genitourinary:       Pelvic will be repeated in OR.  Neurological: She is alert and oriented to person, place, and time.  Skin: Skin is warm and dry.  Psychiatric: She has a normal mood and affect.   CBC    Component Value Date/Time   WBC 10.2 07/22/2012 1200   RBC 5.11 07/22/2012 1200   HGB 14.1 07/22/2012 1200   HCT 42.8 07/22/2012 1200   PLT 285 07/22/2012 1200   MCV 83.8 07/22/2012 1200   MCH 27.6 07/22/2012 1200   MCHC 32.9 07/22/2012 1200   RDW 12.8 07/22/2012 1200   upt negative . Assessment/Plan:  Pt with metrorrhagia bleeding 2x month.  Unresponsive to pills, Dep, or IUD.  Pt would  like to try an ablation.  Pt consented for D & C, hysteroscopy, and hydrothermal endometrial ablation.  Risks include but not limited to bleeding, infection, damage to uterus, burn in the vagina.  SCDs placed for DVT prophylaxis.  All questions answered.   Coron Rossano H. 07/29/2012, 10:21 AM

## 2012-07-29 NOTE — Discharge Instructions (Signed)
DISCHARGE INSTRUCTIONS: HYSTEROSCOPY / ENDOMETRIAL ABLATION The following instructions have been prepared to help you care for yourself upon your return home.  Personal hygiene:  Use sanitary pads for vaginal drainage, not tampons.  Shower the day after your procedure.  NO tub baths, pools or Jacuzzis for 2-3 weeks.  Wipe front to back after using the bathroom.  Activity and limitations:  Do NOT drive or operate any equipment for 24 hours. The effects of anesthesia are still present and drowsiness may result.  Do NOT rest in bed all day.  Walking is encouraged.  Walk up and down stairs slowly.  You may resume your normal activity in one to two days or as indicated by your physician. Sexual activity: NO intercourse for at least 2 weeks after the procedure, or as indicated by your Doctor.  Diet: Eat a light meal as desired this evening. You may resume your usual diet tomorrow.  Return to Work: You may resume your work activities in one to two days or as indicated by Therapist, sports.  What to expect after your surgery: Expect to have vaginal bleeding/discharge for 2-3 days and spotting for up to 10 days. It is not unusual to have soreness for up to 1-2 weeks. You may have a slight burning sensation when you urinate for the first day. Mild cramps may continue for a couple of days. You may have a regular period in 2-6 weeks.  No ibuprofen products (motrin, advil) or aleve until after 7:20pm today.  Call your doctor for any of the following:  Excessive vaginal bleeding or clotting, saturating and changing one pad every hour.  Inability to urinate 6 hours after discharge from hospital.  Pain not relieved by pain medication.  Fever of 100.4 F or greater.  Unusual vaginal discharge or odor.  Return to office _________________Call for an appointment ___________________ Patients signature: ______________________ Nurses signature ________________________  Post Anesthesia  Care Unit [phone]

## 2012-07-29 NOTE — Op Note (Signed)
PREOPERATIVE DIAGNOSIS:  Irregular uterine bleeding POSTOPERATIVE DIAGNOSIS: The same PROCEDURE:  D & C, Hysteroscopy, Hydrothermal Endometrial Ablation,  SURGEON:  Dr. Elsie Lincoln  INDICATIONS: 36 y.o. yo [id number]  here for metrorrhagia unresponsive to medications   .Risks of surgery were discussed with the patient including but not limited to: bleeding which may require transfusion; infection which may require antibiotics; injury to uterus leading to risk of injury to surrounding intraperitoneal organs, need for additional procedures including laparoscopy or laparotomy, and other postoperative/anesthesia complications. Written informed consent was obtained.   FINDINGS:  A nml size uterus with either septum or didelphis.  Proliferative endometrium.  Normal ostia bilaterally.  ANESTHESIA:   General ESTIMATED BLOOD LOSS:  Less than 20 ml. SPECIMENS: [facility] COMPLICATIONS:  None immediate.  PROCEDURE DETAILS:  The patient was taken to the operating room where general anesthesia was administered and was found to be adequate.  After an adequate timeout was performed, she was placed in the dorsal lithotomy position and examined; then prepped and draped in the sterile manner.   Her bladder was catheterized for an unmeasured amount of clear, yellow urine. A speculum was then placed in the patient's vagina and a single tooth tenaculum was applied to the anterior lip of the cervix.  The cervix was dilated manually with half-sized Hegar dilators to accommodate the 8 mm hysteroscope.  Once the cervix was dilated, the hysteroscope was inserted under direct visualization. The uterine cavity was carefully examined, both ostia were recognized, and proliferative endometrium.  A cervical seal test was conducted and there was no fluid loss.  The Hydrothermal ablation was conducted with a 10 minute ablation at 90 degrees centigrade.  There was a 1 minute 30 second cool down period.  The hysteroscope was removed and a gentle  curretage was performed. The tenaculum was removed from the anterior lip of the cervix, and the vaginal speculum was removed after noting good hemostasis.  The patient tolerated the procedure well and was taken to the recovery area awake, extubated and in stable condition.  The patient will be discharged to home as per PACU criteria.  Routine postoperative instructions given.  She was prescribed Percocet and Ibuprofen.  She will follow up in my office in 4 weeks for postoperative evaluation .

## 2012-07-29 NOTE — Anesthesia Preprocedure Evaluation (Signed)
Anesthesia Evaluation    Airway Mallampati: III TM Distance: >3 FB Neck ROM: Full    Dental No notable dental hx. (+) Teeth Intact   Pulmonary former smoker,  breath sounds clear to auscultation  Pulmonary exam normal       Cardiovascular negative cardio ROS  Rhythm:Regular Rate:Normal     Neuro/Psych PSYCHIATRIC DISORDERS Anxiety Depression PTSD   GI/Hepatic negative GI ROS, Neg liver ROS,   Endo/Other  negative endocrine ROS  Renal/GU negative Renal ROS  negative genitourinary   Musculoskeletal  (+) Arthritis -, Osteoarthritis,    Abdominal Normal abdominal exam  (+) + obese,   Peds  Hematology negative hematology ROS (+)   Anesthesia Other Findings   Reproductive/Obstetrics negative OB ROS DUB                           Anesthesia Physical Anesthesia Plan  ASA: II  Anesthesia Plan: General   Post-op Pain Management:    Induction: Intravenous  Airway Management Planned: LMA  Additional Equipment:   Intra-op Plan:   Post-operative Plan: Extubation in OR  Informed Consent: I have reviewed the patients History and Physical, chart, labs and discussed the procedure including the risks, benefits and alternatives for the proposed anesthesia with the patient or authorized representative who has indicated his/her understanding and acceptance.   Dental advisory given  Plan Discussed with: CRNA, Anesthesiologist and Surgeon  Anesthesia Plan Comments:         Anesthesia Quick Evaluation

## 2012-08-15 ENCOUNTER — Encounter (HOSPITAL_COMMUNITY): Payer: Self-pay | Admitting: Psychiatry

## 2012-08-15 ENCOUNTER — Ambulatory Visit (INDEPENDENT_AMBULATORY_CARE_PROVIDER_SITE_OTHER): Payer: BC Managed Care – PPO | Admitting: Psychiatry

## 2012-08-15 VITALS — BP 131/78 | HR 63 | Ht 64.0 in | Wt 188.0 lb

## 2012-08-15 DIAGNOSIS — F329 Major depressive disorder, single episode, unspecified: Secondary | ICD-10-CM

## 2012-08-15 DIAGNOSIS — F4310 Post-traumatic stress disorder, unspecified: Secondary | ICD-10-CM

## 2012-08-15 MED ORDER — TRAZODONE HCL 100 MG PO TABS: ORAL_TABLET | ORAL | Status: DC

## 2012-08-15 MED ORDER — VENLAFAXINE HCL ER 150 MG PO CP24: 150.0000 mg | ORAL_CAPSULE | Freq: Every day | ORAL | Status: DC

## 2012-08-15 MED ORDER — LITHIUM CARBONATE 300 MG PO CAPS: 300.0000 mg | ORAL_CAPSULE | Freq: Two times a day (BID) | ORAL | Status: DC
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
## 2012-08-16 LAB — LITHIUM LEVEL: Lithium Lvl: 0.5 mEq/L — ABNORMAL LOW (ref 0.80–1.40)

## 2012-08-16 LAB — BUN: BUN: 8 mg/dL (ref 6–23)

## 2012-08-28 ENCOUNTER — Encounter: Payer: BC Managed Care – PPO | Admitting: Obstetrics & Gynecology

## 2012-09-17 ENCOUNTER — Telehealth (HOSPITAL_COMMUNITY): Payer: Self-pay

## 2012-09-17 NOTE — Telephone Encounter (Signed)
Patient reports mood swings without stressor.  Patient denies. SI/HI/AVH.   PLAN: Increase Lithium 300 mg TID.  Patient advised to go to ER with any SI/HI/AVH.

## 2012-09-18 ENCOUNTER — Telehealth (HOSPITAL_COMMUNITY): Payer: Self-pay

## 2012-09-18 NOTE — Telephone Encounter (Signed)
PT CALLED STATES SHE IS STILL FEELING THE SAME

## 2012-09-18 NOTE — Telephone Encounter (Signed)
The patient denies SI/HI/AVH. She reports that she has been "snapping" at her family members. She states that woke mad and not she is feeling sad.  She reports that sleeping about 6 hours at night.  She reports that her daughter has had some mood problems related to being bullied and had thoughts of suicide last year. She also reports financial problems;  PLAN: Will continue lithium 900 mg. Will consider increase in venlafaxine if patient's depression worsens.

## 2012-09-27 ENCOUNTER — Other Ambulatory Visit (HOSPITAL_COMMUNITY): Payer: Self-pay | Admitting: Psychiatry

## 2012-10-17 ENCOUNTER — Ambulatory Visit (HOSPITAL_COMMUNITY): Payer: Self-pay | Admitting: Psychiatry

## 2012-11-05 ENCOUNTER — Ambulatory Visit (HOSPITAL_COMMUNITY): Payer: Self-pay | Admitting: Psychiatry

## 2012-11-25 ENCOUNTER — Other Ambulatory Visit (HOSPITAL_COMMUNITY): Payer: Self-pay | Admitting: Psychiatry

## 2012-11-27 ENCOUNTER — Other Ambulatory Visit (HOSPITAL_COMMUNITY): Payer: Self-pay | Admitting: Psychiatry

## 2012-12-02 ENCOUNTER — Other Ambulatory Visit (HOSPITAL_COMMUNITY): Payer: Self-pay | Admitting: Psychiatry

## 2012-12-02 DIAGNOSIS — F329 Major depressive disorder, single episode, unspecified: Secondary | ICD-10-CM

## 2012-12-02 MED ORDER — VENLAFAXINE HCL ER 150 MG PO CP24: 150.0000 mg | ORAL_CAPSULE | Freq: Every day | ORAL | Status: DC

## 2012-12-09 ENCOUNTER — Ambulatory Visit (INDEPENDENT_AMBULATORY_CARE_PROVIDER_SITE_OTHER): Payer: BC Managed Care – PPO | Admitting: Psychiatry

## 2012-12-09 ENCOUNTER — Encounter (HOSPITAL_COMMUNITY): Payer: Self-pay | Admitting: Psychiatry

## 2012-12-09 VITALS — BP 146/86 | HR 65 | Ht 64.0 in | Wt 196.0 lb

## 2012-12-09 DIAGNOSIS — F329 Major depressive disorder, single episode, unspecified: Secondary | ICD-10-CM

## 2012-12-09 DIAGNOSIS — F4310 Post-traumatic stress disorder, unspecified: Secondary | ICD-10-CM

## 2012-12-09 LAB — CREATININE, SERUM: Creat: 0.73 mg/dL (ref 0.50–1.10)

## 2012-12-09 MED ORDER — VENLAFAXINE HCL ER 150 MG PO CP24: 150.0000 mg | ORAL_CAPSULE | Freq: Every day | ORAL | Status: DC

## 2012-12-09 MED ORDER — LITHIUM CARBONATE 300 MG PO CAPS: 300.0000 mg | ORAL_CAPSULE | Freq: Two times a day (BID) | ORAL | Status: DC

## 2012-12-09 MED ORDER — VENLAFAXINE HCL ER 37.5 MG PO CP24: 37.5000 mg | ORAL_CAPSULE | Freq: Every day | ORAL | Status: DC
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
## 2012-12-17 DIAGNOSIS — M25562 Pain in left knee: Secondary | ICD-10-CM

## 2012-12-17 DIAGNOSIS — M25561 Pain in right knee: Secondary | ICD-10-CM | POA: Insufficient documentation

## 2012-12-17 DIAGNOSIS — M2350 Chronic instability of knee, unspecified knee: Secondary | ICD-10-CM | POA: Insufficient documentation

## 2012-12-17 DIAGNOSIS — R29898 Other symptoms and signs involving the musculoskeletal system: Secondary | ICD-10-CM | POA: Insufficient documentation

## 2012-12-17 HISTORY — DX: Other symptoms and signs involving the musculoskeletal system: R29.898

## 2013-01-06 ENCOUNTER — Ambulatory Visit (HOSPITAL_COMMUNITY): Payer: Self-pay | Admitting: Psychiatry

## 2013-01-26 ENCOUNTER — Other Ambulatory Visit (HOSPITAL_COMMUNITY): Payer: Self-pay | Admitting: Psychiatry

## 2013-01-27 ENCOUNTER — Telehealth (HOSPITAL_COMMUNITY): Payer: Self-pay | Admitting: Psychiatry

## 2013-01-27 DIAGNOSIS — F329 Major depressive disorder, single episode, unspecified: Secondary | ICD-10-CM

## 2013-01-27 MED ORDER — LITHIUM CARBONATE 300 MG PO CAPS: 300.0000 mg | ORAL_CAPSULE | Freq: Two times a day (BID) | ORAL | Status: DC

## 2013-01-27 MED ORDER — VENLAFAXINE HCL ER 37.5 MG PO CP24: 37.5000 mg | ORAL_CAPSULE | Freq: Every day | ORAL | Status: DC

## 2013-01-27 NOTE — Telephone Encounter (Signed)
Received e-request for refill of venlafaxine. Will fill 30 day supply and have called patient to schedule an appointment.

## 2013-02-02 ENCOUNTER — Other Ambulatory Visit (HOSPITAL_COMMUNITY): Payer: Self-pay | Admitting: Psychiatry

## 2013-02-03 ENCOUNTER — Encounter (HOSPITAL_COMMUNITY): Payer: Self-pay | Admitting: Psychiatry

## 2013-02-04 ENCOUNTER — Other Ambulatory Visit (HOSPITAL_COMMUNITY): Payer: Self-pay | Admitting: Psychiatry

## 2013-02-05 ENCOUNTER — Telehealth (HOSPITAL_COMMUNITY): Payer: Self-pay | Admitting: Psychiatry

## 2013-02-05 DIAGNOSIS — F329 Major depressive disorder, single episode, unspecified: Secondary | ICD-10-CM

## 2013-02-05 MED ORDER — VENLAFAXINE HCL ER 37.5 MG PO CP24: 37.5000 mg | ORAL_CAPSULE | Freq: Every day | ORAL | Status: DC

## 2013-02-05 NOTE — Telephone Encounter (Signed)
E request for venlafaxine. Will fill the same.

## 2013-02-07 ENCOUNTER — Other Ambulatory Visit (HOSPITAL_COMMUNITY): Payer: Self-pay | Admitting: Psychiatry

## 2013-02-14 ENCOUNTER — Other Ambulatory Visit (HOSPITAL_COMMUNITY): Payer: Self-pay | Admitting: Psychiatry

## 2013-02-19 ENCOUNTER — Ambulatory Visit (INDEPENDENT_AMBULATORY_CARE_PROVIDER_SITE_OTHER): Payer: BC Managed Care – PPO | Admitting: Psychiatry

## 2013-02-19 ENCOUNTER — Encounter (HOSPITAL_COMMUNITY): Payer: Self-pay | Admitting: Psychiatry

## 2013-02-19 VITALS — BP 129/84 | HR 73 | Ht 64.0 in | Wt 200.0 lb

## 2013-02-19 DIAGNOSIS — F4310 Post-traumatic stress disorder, unspecified: Secondary | ICD-10-CM

## 2013-02-19 DIAGNOSIS — F329 Major depressive disorder, single episode, unspecified: Secondary | ICD-10-CM

## 2013-02-19 MED ORDER — LITHIUM CARBONATE 300 MG PO CAPS: 300.0000 mg | ORAL_CAPSULE | Freq: Two times a day (BID) | ORAL | Status: DC

## 2013-02-19 MED ORDER — VENLAFAXINE HCL ER 75 MG PO CP24: ORAL_CAPSULE | ORAL | Status: DC
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
## 2013-03-21 ENCOUNTER — Encounter (HOSPITAL_COMMUNITY): Payer: Self-pay | Admitting: Psychiatry

## 2013-03-21 ENCOUNTER — Ambulatory Visit (INDEPENDENT_AMBULATORY_CARE_PROVIDER_SITE_OTHER): Payer: BC Managed Care – PPO | Admitting: Psychiatry

## 2013-03-21 VITALS — BP 134/90 | HR 73 | Ht 64.0 in | Wt 200.0 lb

## 2013-03-21 DIAGNOSIS — F329 Major depressive disorder, single episode, unspecified: Secondary | ICD-10-CM

## 2013-03-21 DIAGNOSIS — F4310 Post-traumatic stress disorder, unspecified: Secondary | ICD-10-CM

## 2013-03-21 LAB — CREATININE, SERUM: Creat: 0.7 mg/dL (ref 0.50–1.10)

## 2013-03-21 MED ORDER — LITHIUM CARBONATE 300 MG PO CAPS: 300.0000 mg | ORAL_CAPSULE | Freq: Two times a day (BID) | ORAL | Status: DC

## 2013-03-21 MED ORDER — VENLAFAXINE HCL ER 75 MG PO CP24: ORAL_CAPSULE | ORAL | Status: DC
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
## 2013-03-22 LAB — TSH: TSH: 1.838 u[IU]/mL (ref 0.350–4.500)

## 2013-04-28 ENCOUNTER — Ambulatory Visit (HOSPITAL_COMMUNITY): Payer: Self-pay | Admitting: Psychiatry

## 2013-04-30 ENCOUNTER — Ambulatory Visit (HOSPITAL_COMMUNITY): Payer: Self-pay | Admitting: Psychiatry

## 2013-05-19 ENCOUNTER — Ambulatory Visit (HOSPITAL_COMMUNITY): Payer: Self-pay | Admitting: Psychiatry

## 2013-05-26 ENCOUNTER — Encounter (HOSPITAL_COMMUNITY): Payer: Self-pay | Admitting: Psychiatry

## 2013-05-26 ENCOUNTER — Ambulatory Visit (INDEPENDENT_AMBULATORY_CARE_PROVIDER_SITE_OTHER): Payer: BC Managed Care – PPO | Admitting: Psychiatry

## 2013-05-26 VITALS — BP 117/80 | HR 81 | Ht 64.0 in | Wt 200.5 lb

## 2013-05-26 DIAGNOSIS — F4310 Post-traumatic stress disorder, unspecified: Secondary | ICD-10-CM

## 2013-05-26 DIAGNOSIS — F329 Major depressive disorder, single episode, unspecified: Secondary | ICD-10-CM

## 2013-05-26 MED ORDER — LITHIUM CARBONATE 300 MG PO CAPS: 300.0000 mg | ORAL_CAPSULE | Freq: Three times a day (TID) | ORAL | Status: DC

## 2013-05-26 MED ORDER — VENLAFAXINE HCL ER 75 MG PO CP24: ORAL_CAPSULE | ORAL | Status: DC
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
## 2013-06-26 ENCOUNTER — Ambulatory Visit (HOSPITAL_COMMUNITY): Payer: Self-pay | Admitting: Psychiatry

## 2013-07-15 ENCOUNTER — Ambulatory Visit (INDEPENDENT_AMBULATORY_CARE_PROVIDER_SITE_OTHER): Payer: BC Managed Care – PPO | Admitting: Psychiatry

## 2013-07-15 ENCOUNTER — Encounter (HOSPITAL_COMMUNITY): Payer: Self-pay | Admitting: Psychiatry

## 2013-07-15 VITALS — BP 113/78 | HR 65 | Ht 64.0 in | Wt 201.5 lb

## 2013-07-15 DIAGNOSIS — F4310 Post-traumatic stress disorder, unspecified: Secondary | ICD-10-CM

## 2013-07-15 DIAGNOSIS — F329 Major depressive disorder, single episode, unspecified: Secondary | ICD-10-CM

## 2013-07-15 MED ORDER — VENLAFAXINE HCL ER 75 MG PO CP24: ORAL_CAPSULE | ORAL | Status: DC

## 2013-07-15 MED ORDER — LITHIUM CARBONATE 150 MG PO CAPS: ORAL_CAPSULE | ORAL | Status: DC

## 2013-07-15 MED ORDER — TRAZODONE HCL 150 MG PO TABS: 150.0000 mg | ORAL_TABLET | Freq: Every day | ORAL | Status: DC
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
## 2013-08-14 ENCOUNTER — Ambulatory Visit (HOSPITAL_COMMUNITY): Payer: Self-pay | Admitting: Psychiatry

## 2013-08-14 ENCOUNTER — Telehealth (HOSPITAL_COMMUNITY): Payer: Self-pay

## 2013-08-14 DIAGNOSIS — F329 Major depressive disorder, single episode, unspecified: Secondary | ICD-10-CM

## 2013-08-14 MED ORDER — VENLAFAXINE HCL ER 75 MG PO CP24: ORAL_CAPSULE | ORAL | Status: DC

## 2013-08-14 NOTE — Telephone Encounter (Signed)
Done

## 2013-08-21 ENCOUNTER — Other Ambulatory Visit: Payer: Self-pay

## 2013-09-01 ENCOUNTER — Ambulatory Visit (HOSPITAL_COMMUNITY): Payer: Self-pay | Admitting: Psychiatry

## 2013-10-06 ENCOUNTER — Ambulatory Visit (HOSPITAL_COMMUNITY): Payer: Self-pay | Admitting: Psychiatry

## 2013-10-06 ENCOUNTER — Telehealth (HOSPITAL_COMMUNITY): Payer: Self-pay

## 2013-10-06 DIAGNOSIS — F329 Major depressive disorder, single episode, unspecified: Secondary | ICD-10-CM

## 2013-10-08 MED ORDER — TRAZODONE HCL 150 MG PO TABS: 150.0000 mg | ORAL_TABLET | Freq: Every day | ORAL | Status: DC

## 2013-10-08 NOTE — Telephone Encounter (Signed)
Refilled trazodone.

## 2013-11-14 ENCOUNTER — Ambulatory Visit (HOSPITAL_COMMUNITY): Payer: Self-pay | Admitting: Psychiatry

## 2013-11-28 ENCOUNTER — Other Ambulatory Visit (HOSPITAL_COMMUNITY): Payer: Self-pay | Admitting: Psychiatry

## 2013-12-01 ENCOUNTER — Encounter (HOSPITAL_COMMUNITY): Payer: Self-pay | Admitting: Psychiatry

## 2013-12-01 ENCOUNTER — Ambulatory Visit (INDEPENDENT_AMBULATORY_CARE_PROVIDER_SITE_OTHER): Payer: BC Managed Care – PPO | Admitting: Psychiatry

## 2013-12-01 VITALS — BP 127/79 | HR 73 | Wt 201.5 lb

## 2013-12-01 DIAGNOSIS — F329 Major depressive disorder, single episode, unspecified: Secondary | ICD-10-CM

## 2013-12-01 DIAGNOSIS — F4310 Post-traumatic stress disorder, unspecified: Secondary | ICD-10-CM

## 2013-12-01 MED ORDER — LAMOTRIGINE 25 MG PO TABS: ORAL_TABLET | ORAL | Status: DC

## 2013-12-01 MED ORDER — VENLAFAXINE HCL ER 75 MG PO CP24: ORAL_CAPSULE | ORAL | Status: DC

## 2013-12-01 MED ORDER — LITHIUM CARBONATE 150 MG PO CAPS: ORAL_CAPSULE | ORAL | Status: DC

## 2013-12-01 MED ORDER — LAMOTRIGINE 25 MG PO TABS: 25.0000 mg | ORAL_TABLET | Freq: Every day | ORAL | Status: DC
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
## 2014-01-01 ENCOUNTER — Encounter: Payer: Self-pay | Admitting: Sports Medicine

## 2014-01-01 ENCOUNTER — Ambulatory Visit (INDEPENDENT_AMBULATORY_CARE_PROVIDER_SITE_OTHER): Payer: BC Managed Care – PPO

## 2014-01-01 ENCOUNTER — Ambulatory Visit (INDEPENDENT_AMBULATORY_CARE_PROVIDER_SITE_OTHER): Payer: BC Managed Care – PPO | Admitting: Sports Medicine

## 2014-01-01 VITALS — BP 136/80 | HR 88 | Ht 64.0 in | Wt 201.0 lb

## 2014-01-01 DIAGNOSIS — M1712 Unilateral primary osteoarthritis, left knee: Secondary | ICD-10-CM

## 2014-01-01 DIAGNOSIS — M25569 Pain in unspecified knee: Secondary | ICD-10-CM

## 2014-01-01 DIAGNOSIS — Z299 Encounter for prophylactic measures, unspecified: Secondary | ICD-10-CM

## 2014-01-01 DIAGNOSIS — M1710 Unilateral primary osteoarthritis, unspecified knee: Secondary | ICD-10-CM

## 2014-01-01 DIAGNOSIS — M67439 Ganglion, unspecified wrist: Secondary | ICD-10-CM | POA: Insufficient documentation

## 2014-01-01 DIAGNOSIS — IMO0002 Reserved for concepts with insufficient information to code with codable children: Secondary | ICD-10-CM

## 2014-01-01 DIAGNOSIS — M6740 Ganglion, unspecified site: Secondary | ICD-10-CM

## 2014-01-01 DIAGNOSIS — Z96652 Presence of left artificial knee joint: Secondary | ICD-10-CM | POA: Insufficient documentation

## 2014-01-01 DIAGNOSIS — M898X9 Other specified disorders of bone, unspecified site: Secondary | ICD-10-CM

## 2014-01-01 HISTORY — DX: Unilateral primary osteoarthritis, left knee: M17.12

## 2014-01-01 HISTORY — DX: Ganglion, unspecified wrist: M67.439

## 2014-01-01 MED ORDER — MELOXICAM 15 MG PO TABS: ORAL_TABLET | ORAL | Status: DC

## 2014-01-01 NOTE — Progress Notes (Signed)
  Subjective:    CC: Establish care.   HPI:  Left knee pain: Moderate, persistent, localized at the anterior joint line, has had NSAIDs, injections, physical therapy, nothing has helped more than temporarily.  Muscle aches: She does endorse bilateral muscle aches and body aches diffusely.  Left wrist lump: There is a well-defined, mass, nontender over the dorsum of her left wrist.  Preventive measure: Pap smear was in 2013, she does have an OB/GYN, she does desire to establish care.  Past medical history, Surgical history, Family history not pertinant except as noted below, Social history, Allergies, and medications have been entered into the medical record, reviewed, and no changes needed.   Review of Systems: No headache, visual changes, nausea, vomiting, diarrhea, constipation, dizziness, abdominal pain, skin rash, fevers, chills, night sweats, swollen lymph nodes, weight loss, chest pain, body aches, joint swelling, muscle aches, shortness of breath, mood changes, visual or auditory hallucinations.  Objective:    General: Well Developed, well nourished, and in no acute distress.  Neuro: Alert and oriented x3, extra-ocular muscles intact, sensation grossly intact.  HEENT: Normocephalic, atraumatic, pupils equal round reactive to light, neck supple, no masses, no lymphadenopathy, thyroid nonpalpable.  Skin: Warm and dry, no rashes noted.  Cardiac: Regular rate and rhythm, no murmurs rubs or gallops.  Respiratory: Clear to auscultation bilaterally. Not using accessory muscles, speaking in full sentences.  Abdominal: Soft, nontender, nondistended, positive bowel sounds, no masses, no organomegaly.  Left Knee: Moderate visible and palpable effusion with a fluid wave and medial joint line pain. ROM full in flexion and extension and lower leg rotation. Ligaments with solid consistent endpoints including ACL, PCL, LCL, MCL. Negative Mcmurray's, Apley's, and Thessalonian tests. Non painful  patellar compression. Patellar glide without crepitus. Patellar and quadriceps tendons unremarkable. Hamstring and quadriceps strength is normal.  Left wrist: There is a palpable, nontender, movable ganglion cyst at the dorsal radiocarpal joint.  Procedure: Real-time Ultrasound Guided aspiration/Injection of left knee Device: GE Logiq E  Verbal informed consent obtained.  Time-out conducted.  Noted no overlying erythema, induration, or other signs of local infection.  Skin prepped in a sterile fashion.  Local anesthesia: Topical Ethyl chloride.  With sterile technique and under real time ultrasound guidance:  40 cc of straw-colored fluid aspirated, syringe switched and 2 cc Kenalog 40, 4 cc lidocaine injected easily. Completed without difficulty  Pain immediately resolved suggesting accurate placement of the medication.  Advised to call if fevers/chills, erythema, induration, drainage, or persistent bleeding.  Images permanently stored and available for review in the ultrasound unit.  Impression: Technically successful ultrasound guided injection.  X-rays were reviewed do show mild osteoarthritis in the patellofemoral joint.  Impression and Recommendations:    The patient was counselled, risk factors were discussed, anticipatory guidance given.

## 2014-01-01 NOTE — Assessment & Plan Note (Signed)
Aspiration and injection as above. X-rays. Formal physical therapy.

## 2014-01-01 NOTE — Assessment & Plan Note (Addendum)
Checking routine blood work. Pap smear in 2013, sees OB/GYN.

## 2014-01-09 ENCOUNTER — Encounter (HOSPITAL_COMMUNITY): Payer: Self-pay | Admitting: Psychiatry

## 2014-01-09 ENCOUNTER — Ambulatory Visit (INDEPENDENT_AMBULATORY_CARE_PROVIDER_SITE_OTHER): Payer: BC Managed Care – PPO | Admitting: Psychiatry

## 2014-01-09 ENCOUNTER — Ambulatory Visit (INDEPENDENT_AMBULATORY_CARE_PROVIDER_SITE_OTHER): Payer: BC Managed Care – PPO | Admitting: Physical Therapy

## 2014-01-09 VITALS — BP 126/78 | HR 68 | Wt 197.0 lb

## 2014-01-09 DIAGNOSIS — R5381 Other malaise: Secondary | ICD-10-CM

## 2014-01-09 DIAGNOSIS — F329 Major depressive disorder, single episode, unspecified: Secondary | ICD-10-CM

## 2014-01-09 DIAGNOSIS — M25569 Pain in unspecified knee: Secondary | ICD-10-CM

## 2014-01-09 DIAGNOSIS — IMO0002 Reserved for concepts with insufficient information to code with codable children: Secondary | ICD-10-CM

## 2014-01-09 DIAGNOSIS — F4310 Post-traumatic stress disorder, unspecified: Secondary | ICD-10-CM

## 2014-01-09 DIAGNOSIS — M1710 Unilateral primary osteoarthritis, unspecified knee: Secondary | ICD-10-CM

## 2014-01-09 DIAGNOSIS — M2560 Stiffness of unspecified joint, not elsewhere classified: Secondary | ICD-10-CM

## 2014-01-09 DIAGNOSIS — R262 Difficulty in walking, not elsewhere classified: Secondary | ICD-10-CM

## 2014-01-09 MED ORDER — LAMOTRIGINE 100 MG PO TABS: 100.0000 mg | ORAL_TABLET | Freq: Every day | ORAL | Status: DC

## 2014-01-09 MED ORDER — VENLAFAXINE HCL ER 75 MG PO CP24: 225.0000 mg | ORAL_CAPSULE | Freq: Every day | ORAL | Status: DC
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
## 2014-01-12 ENCOUNTER — Encounter: Payer: Self-pay | Admitting: Physical Therapy

## 2014-01-14 ENCOUNTER — Encounter (INDEPENDENT_AMBULATORY_CARE_PROVIDER_SITE_OTHER): Payer: BC Managed Care – PPO | Admitting: Physical Therapy

## 2014-01-14 DIAGNOSIS — R5381 Other malaise: Secondary | ICD-10-CM

## 2014-01-14 DIAGNOSIS — M2560 Stiffness of unspecified joint, not elsewhere classified: Secondary | ICD-10-CM

## 2014-01-14 DIAGNOSIS — R262 Difficulty in walking, not elsewhere classified: Secondary | ICD-10-CM

## 2014-01-14 DIAGNOSIS — M1710 Unilateral primary osteoarthritis, unspecified knee: Secondary | ICD-10-CM

## 2014-01-14 DIAGNOSIS — IMO0002 Reserved for concepts with insufficient information to code with codable children: Secondary | ICD-10-CM

## 2014-01-14 DIAGNOSIS — M25569 Pain in unspecified knee: Secondary | ICD-10-CM

## 2014-01-21 ENCOUNTER — Encounter (INDEPENDENT_AMBULATORY_CARE_PROVIDER_SITE_OTHER): Payer: BC Managed Care – PPO

## 2014-01-21 ENCOUNTER — Telehealth: Payer: Self-pay | Admitting: *Deleted

## 2014-01-21 DIAGNOSIS — M1710 Unilateral primary osteoarthritis, unspecified knee: Secondary | ICD-10-CM

## 2014-01-21 DIAGNOSIS — R262 Difficulty in walking, not elsewhere classified: Secondary | ICD-10-CM

## 2014-01-21 DIAGNOSIS — M25569 Pain in unspecified knee: Secondary | ICD-10-CM

## 2014-01-21 DIAGNOSIS — IMO0002 Reserved for concepts with insufficient information to code with codable children: Secondary | ICD-10-CM

## 2014-01-21 DIAGNOSIS — M2560 Stiffness of unspecified joint, not elsewhere classified: Secondary | ICD-10-CM

## 2014-01-21 DIAGNOSIS — R5381 Other malaise: Secondary | ICD-10-CM

## 2014-01-21 NOTE — Telephone Encounter (Signed)
Pt is requesting a referral to a different pain management doctor. States they are trying to force her to have injections for her migraines and states she never gets to talk to the doctor. States she doesn't want the injections and feels like they are  "bullying her" to get them.

## 2014-01-21 NOTE — Telephone Encounter (Signed)
We have never discussed migraines, it would seem that a neurologist would be better to discuss migraines.  We should discuss this in the office and maybe come up with a better plan, especially if she is seeing pain management for migraines.

## 2014-01-22 NOTE — Telephone Encounter (Signed)
LMOM with response.  [name], LPN

## 2014-01-23 ENCOUNTER — Encounter (INDEPENDENT_AMBULATORY_CARE_PROVIDER_SITE_OTHER): Payer: BC Managed Care – PPO | Admitting: Physical Therapy

## 2014-01-23 DIAGNOSIS — M25569 Pain in unspecified knee: Secondary | ICD-10-CM

## 2014-01-23 DIAGNOSIS — R262 Difficulty in walking, not elsewhere classified: Secondary | ICD-10-CM

## 2014-01-23 DIAGNOSIS — R5381 Other malaise: Secondary | ICD-10-CM

## 2014-01-23 DIAGNOSIS — M2560 Stiffness of unspecified joint, not elsewhere classified: Secondary | ICD-10-CM

## 2014-01-23 DIAGNOSIS — IMO0002 Reserved for concepts with insufficient information to code with codable children: Secondary | ICD-10-CM

## 2014-01-23 DIAGNOSIS — M1710 Unilateral primary osteoarthritis, unspecified knee: Secondary | ICD-10-CM

## 2014-01-23 LAB — CBC
HCT: 43.4 % (ref 36.0–46.0)
Hemoglobin: 14.5 g/dL (ref 12.0–15.0)
MCH: 28 pg (ref 26.0–34.0)
MCHC: 33.4 g/dL (ref 30.0–36.0)
MCV: 83.8 fL (ref 78.0–100.0)
Platelets: 287 10*3/uL (ref 150–400)
RBC: 5.18 MIL/uL — ABNORMAL HIGH (ref 3.87–5.11)
RDW: 15.5 % (ref 11.5–15.5)
WBC: 8.2 10*3/uL (ref 4.0–10.5)

## 2014-01-23 LAB — COMPREHENSIVE METABOLIC PANEL WITH GFR
BUN: 13 mg/dL (ref 6–23)
CO2: 23 meq/L (ref 19–32)
Creat: 0.76 mg/dL (ref 0.50–1.10)
Total Bilirubin: 0.5 mg/dL (ref 0.2–1.2)
Total Protein: 7 g/dL (ref 6.0–8.3)

## 2014-01-23 LAB — COMPREHENSIVE METABOLIC PANEL
ALT: 20 U/L (ref 0–35)
AST: 25 U/L (ref 0–37)
Albumin: 4.3 g/dL (ref 3.5–5.2)
Alkaline Phosphatase: 59 U/L (ref 39–117)
Calcium: 9.4 mg/dL (ref 8.4–10.5)
Chloride: 106 mEq/L (ref 96–112)
Glucose, Bld: 94 mg/dL (ref 70–99)
Potassium: 4.8 mEq/L (ref 3.5–5.3)
Sodium: 140 mEq/L (ref 135–145)

## 2014-01-23 LAB — LIPID PANEL
Cholesterol: 187 mg/dL (ref 0–200)
HDL: 51 mg/dL (ref >39)
LDL Cholesterol: 98 mg/dL (ref 0–99)
Total CHOL/HDL Ratio: 3.7 ratio
Triglycerides: 192 mg/dL — ABNORMAL HIGH (ref <150)
VLDL: 38 mg/dL (ref 0–40)

## 2014-01-23 LAB — CK: Total CK: 204 U/L — ABNORMAL HIGH (ref 7–177)

## 2014-01-23 LAB — SEDIMENTATION RATE: Sed Rate: 1 mm/hr (ref 0–22)

## 2014-01-23 LAB — URIC ACID: Uric Acid, Serum: 4.9 mg/dL (ref 2.4–7.0)

## 2014-01-23 LAB — RHEUMATOID FACTOR: Rheumatoid fact SerPl-aCnc: 19 [IU]/mL — ABNORMAL HIGH (ref <=14)

## 2014-01-24 LAB — TSH: TSH: 1.188 u[IU]/mL (ref 0.350–4.500)

## 2014-01-26 LAB — ANA: Anti Nuclear Antibody(ANA): NEGATIVE

## 2014-01-26 LAB — CYCLIC CITRUL PEPTIDE ANTIBODY, IGG: Cyclic Citrullin Peptide Ab: <2 U/mL (ref 0.0–5.0)

## 2014-01-27 ENCOUNTER — Other Ambulatory Visit: Payer: Self-pay | Admitting: Sports Medicine

## 2014-02-02 ENCOUNTER — Encounter: Payer: Self-pay | Admitting: Physical Therapy

## 2014-02-03 ENCOUNTER — Encounter: Payer: Self-pay | Admitting: Sports Medicine

## 2014-02-03 ENCOUNTER — Encounter (INDEPENDENT_AMBULATORY_CARE_PROVIDER_SITE_OTHER): Payer: BC Managed Care – PPO | Admitting: Physical Therapy

## 2014-02-03 ENCOUNTER — Ambulatory Visit (INDEPENDENT_AMBULATORY_CARE_PROVIDER_SITE_OTHER): Payer: BC Managed Care – PPO | Admitting: Sports Medicine

## 2014-02-03 VITALS — BP 122/72 | HR 78 | Ht 64.0 in | Wt 195.0 lb

## 2014-02-03 DIAGNOSIS — M1710 Unilateral primary osteoarthritis, unspecified knee: Secondary | ICD-10-CM

## 2014-02-03 DIAGNOSIS — R262 Difficulty in walking, not elsewhere classified: Secondary | ICD-10-CM

## 2014-02-03 DIAGNOSIS — M25569 Pain in unspecified knee: Secondary | ICD-10-CM

## 2014-02-03 DIAGNOSIS — R5381 Other malaise: Secondary | ICD-10-CM

## 2014-02-03 DIAGNOSIS — IMO0002 Reserved for concepts with insufficient information to code with codable children: Secondary | ICD-10-CM

## 2014-02-03 DIAGNOSIS — E669 Obesity, unspecified: Secondary | ICD-10-CM

## 2014-02-03 DIAGNOSIS — M2560 Stiffness of unspecified joint, not elsewhere classified: Secondary | ICD-10-CM

## 2014-02-03 DIAGNOSIS — M1712 Unilateral primary osteoarthritis, left knee: Secondary | ICD-10-CM

## 2014-02-03 HISTORY — DX: Obesity, unspecified: E66.9

## 2014-02-03 MED ORDER — PHENTERMINE HCL 37.5 MG PO CAPS: 37.5000 mg | ORAL_CAPSULE | ORAL | Status: DC

## 2014-02-03 NOTE — Assessment & Plan Note (Signed)
Starting phentermine, nutrition referral. Return monthly with her husband for weight checks and refills.

## 2014-02-03 NOTE — Progress Notes (Signed)
  Subjective:    CC: Followup  HPI: Left knee osteoarthritis : nearly pain-free after aspiration and injection, she does get an occasional catching sensation at the anteromedial joint line with bending her knee.  Left wrist ganglion cyst: Continues to be asymptomatic.  Obesity: Desires to try weight loss medication like her husband.  Past medical history, Surgical history, Family history not pertinant except as noted below, Social history, Allergies, and medications have been entered into the medical record, reviewed, and no changes needed.   Review of Systems: No fevers, chills, night sweats, weight loss, chest pain, or shortness of breath.   Objective:    General: Well Developed, well nourished, and in no acute distress.  Neuro: Alert and oriented x3, extra-ocular muscles intact, sensation grossly intact.  HEENT: Normocephalic, atraumatic, pupils equal round reactive to light, neck supple, no masses, no lymphadenopathy, thyroid nonpalpable.  Skin: Warm and dry, no rashes. Cardiac: Regular rate and rhythm, no murmurs rubs or gallops, no lower extremity edema.  Respiratory: Clear to auscultation bilaterally. Not using accessory muscles, speaking in full sentences. Left Knee: Normal to inspection with no erythema or effusion or obvious bony abnormalities. Only minimal tenderness to palpation at the anteromedial joint line. ROM full in flexion and extension and lower leg rotation. Ligaments with solid consistent endpoints including ACL, PCL, LCL, MCL. Positive McMurray's test with reproduction of pain and a click at the medial joint line. Non painful patellar compression. Patellar glide without crepitus. Patellar and quadriceps tendons unremarkable. Hamstring and quadriceps strength is normal.   Impression and Recommendations:

## 2014-02-03 NOTE — Assessment & Plan Note (Addendum)
Pain-free after injection, continue PT, continue brace. Occasional catching sensation in the medial joint line, positive mcmurrays suggestive of a degenerative meniscal tear. She will finish physical therapy and if continued mechanical symptoms and pain we can consider referral for arthroscopy.

## 2014-02-10 ENCOUNTER — Encounter: Payer: Self-pay | Admitting: Obstetrics & Gynecology

## 2014-02-10 ENCOUNTER — Ambulatory Visit (INDEPENDENT_AMBULATORY_CARE_PROVIDER_SITE_OTHER): Payer: BC Managed Care – PPO | Admitting: Obstetrics & Gynecology

## 2014-02-10 VITALS — BP 137/91 | HR 76 | Resp 16 | Ht 64.0 in | Wt 196.0 lb

## 2014-02-10 DIAGNOSIS — N943 Premenstrual tension syndrome: Secondary | ICD-10-CM

## 2014-02-10 DIAGNOSIS — F3281 Premenstrual dysphoric disorder: Secondary | ICD-10-CM

## 2014-02-10 HISTORY — DX: Premenstrual tension syndrome: N94.3

## 2014-02-10 MED ORDER — NORETHIN ACE-ETH ESTRAD-FE 1-20 MG-MCG(24) PO TABS
1.0000 | ORAL_TABLET | Freq: Every day | ORAL | Status: DC
Start: 2014-02-10 — End: 2014-11-12
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
## 2014-02-11 ENCOUNTER — Encounter (INDEPENDENT_AMBULATORY_CARE_PROVIDER_SITE_OTHER): Payer: BC Managed Care – PPO

## 2014-02-11 ENCOUNTER — Encounter: Payer: Self-pay | Admitting: Physical Therapy

## 2014-02-11 DIAGNOSIS — M25569 Pain in unspecified knee: Secondary | ICD-10-CM

## 2014-02-11 DIAGNOSIS — R262 Difficulty in walking, not elsewhere classified: Secondary | ICD-10-CM

## 2014-02-11 DIAGNOSIS — R5381 Other malaise: Secondary | ICD-10-CM

## 2014-02-11 DIAGNOSIS — M1710 Unilateral primary osteoarthritis, unspecified knee: Secondary | ICD-10-CM

## 2014-02-11 DIAGNOSIS — M25669 Stiffness of unspecified knee, not elsewhere classified: Secondary | ICD-10-CM

## 2014-02-11 DIAGNOSIS — IMO0002 Reserved for concepts with insufficient information to code with codable children: Secondary | ICD-10-CM

## 2014-02-17 ENCOUNTER — Encounter: Payer: Self-pay | Admitting: Physical Therapy

## 2014-03-02 ENCOUNTER — Encounter (INDEPENDENT_AMBULATORY_CARE_PROVIDER_SITE_OTHER): Payer: BC Managed Care – PPO | Admitting: Physical Therapy

## 2014-03-02 DIAGNOSIS — IMO0002 Reserved for concepts with insufficient information to code with codable children: Secondary | ICD-10-CM

## 2014-03-02 DIAGNOSIS — M25569 Pain in unspecified knee: Secondary | ICD-10-CM

## 2014-03-02 DIAGNOSIS — M2560 Stiffness of unspecified joint, not elsewhere classified: Secondary | ICD-10-CM

## 2014-03-02 DIAGNOSIS — M1710 Unilateral primary osteoarthritis, unspecified knee: Secondary | ICD-10-CM

## 2014-03-02 DIAGNOSIS — R262 Difficulty in walking, not elsewhere classified: Secondary | ICD-10-CM

## 2014-03-02 DIAGNOSIS — R5381 Other malaise: Secondary | ICD-10-CM

## 2014-03-03 ENCOUNTER — Ambulatory Visit: Payer: Self-pay | Admitting: Sports Medicine

## 2014-03-10 ENCOUNTER — Encounter (INDEPENDENT_AMBULATORY_CARE_PROVIDER_SITE_OTHER): Payer: BC Managed Care – PPO | Admitting: Physical Therapy

## 2014-03-10 DIAGNOSIS — M1710 Unilateral primary osteoarthritis, unspecified knee: Secondary | ICD-10-CM

## 2014-03-10 DIAGNOSIS — R5381 Other malaise: Secondary | ICD-10-CM

## 2014-03-10 DIAGNOSIS — M25569 Pain in unspecified knee: Secondary | ICD-10-CM

## 2014-03-10 DIAGNOSIS — IMO0002 Reserved for concepts with insufficient information to code with codable children: Secondary | ICD-10-CM

## 2014-03-10 DIAGNOSIS — M2560 Stiffness of unspecified joint, not elsewhere classified: Secondary | ICD-10-CM

## 2014-03-10 DIAGNOSIS — R262 Difficulty in walking, not elsewhere classified: Secondary | ICD-10-CM

## 2014-03-18 ENCOUNTER — Encounter: Payer: Self-pay | Admitting: Physical Therapy

## 2014-03-19 ENCOUNTER — Ambulatory Visit (INDEPENDENT_AMBULATORY_CARE_PROVIDER_SITE_OTHER): Payer: BC Managed Care – PPO | Admitting: Sports Medicine

## 2014-03-19 ENCOUNTER — Encounter: Payer: Self-pay | Admitting: Sports Medicine

## 2014-03-19 VITALS — BP 142/81 | HR 69 | Ht 64.0 in | Wt 189.0 lb

## 2014-03-19 DIAGNOSIS — G43909 Migraine, unspecified, not intractable, without status migrainosus: Secondary | ICD-10-CM

## 2014-03-19 DIAGNOSIS — M1712 Unilateral primary osteoarthritis, left knee: Secondary | ICD-10-CM

## 2014-03-19 DIAGNOSIS — IMO0002 Reserved for concepts with insufficient information to code with codable children: Secondary | ICD-10-CM

## 2014-03-19 DIAGNOSIS — E669 Obesity, unspecified: Secondary | ICD-10-CM

## 2014-03-19 DIAGNOSIS — M1710 Unilateral primary osteoarthritis, unspecified knee: Secondary | ICD-10-CM

## 2014-03-19 HISTORY — DX: Migraine, unspecified, not intractable, without status migrainosus: G43.909

## 2014-03-19 MED ORDER — PHENTERMINE HCL 37.5 MG PO CAPS: 37.5000 mg | ORAL_CAPSULE | ORAL | Status: DC

## 2014-03-19 NOTE — Assessment & Plan Note (Signed)
Per patient request referral to neurology.

## 2014-03-19 NOTE — Progress Notes (Signed)
  Subjective:    CC: Followup  HPI: Left knee pain: Likely with a degenerative meniscal tear, failed an initial injection.  Obesity: Good weight loss with phentermine.  Migraine headaches: Desires referral to neurology.  Cervical degenerative disease: Currently undergoing facet injections with her pain doctor.  Past medical history, Surgical history, Family history not pertinant except as noted below, Social history, Allergies, and medications have been entered into the medical record, reviewed, and no changes needed.   Review of Systems: No fevers, chills, night sweats, weight loss, chest pain, or shortness of breath.   Objective:    General: Well Developed, well nourished, and in no acute distress.  Neuro: Alert and oriented x3, extra-ocular muscles intact, sensation grossly intact.  HEENT: Normocephalic, atraumatic, pupils equal round reactive to light, neck supple, no masses, no lymphadenopathy, thyroid nonpalpable.  Skin: Warm and dry, no rashes. Cardiac: Regular rate and rhythm, no murmurs rubs or gallops, no lower extremity edema.  Respiratory: Clear to auscultation bilaterally. Not using accessory muscles, speaking in full sentences.  Impression and Recommendations:

## 2014-03-19 NOTE — Assessment & Plan Note (Signed)
Unfortunately pain has recurred about 6 weeks after the injection. She likely has a degenerative meniscal tear. Pain as well as mechanical symptoms have continued. Referral to [place] for consideration of knee arthroscopy.

## 2014-03-19 NOTE — Assessment & Plan Note (Signed)
Good weight loss. Refilling phentermine. Return in a month for a weight check and refills.

## 2014-04-16 ENCOUNTER — Ambulatory Visit: Payer: Self-pay | Admitting: Sports Medicine

## 2014-04-27 ENCOUNTER — Ambulatory Visit: Payer: Self-pay | Admitting: Sports Medicine

## 2014-05-05 ENCOUNTER — Ambulatory Visit (INDEPENDENT_AMBULATORY_CARE_PROVIDER_SITE_OTHER): Payer: BC Managed Care – PPO | Admitting: Sports Medicine

## 2014-05-05 ENCOUNTER — Encounter: Payer: Self-pay | Admitting: Sports Medicine

## 2014-05-05 VITALS — BP 132/84 | HR 63 | Ht 64.0 in | Wt 184.0 lb

## 2014-05-05 DIAGNOSIS — M1710 Unilateral primary osteoarthritis, unspecified knee: Secondary | ICD-10-CM

## 2014-05-05 DIAGNOSIS — M1712 Unilateral primary osteoarthritis, left knee: Secondary | ICD-10-CM

## 2014-05-05 NOTE — Progress Notes (Signed)
  Subjective:    CC: Followup  HPI: Left knee osteoarthritis colon getting scheduled for left knee arthroscopy by Dr. Berenice Primas, he does plan viscosupplementation afterwards, she does have a expensive co-pay [facility] so I've told her to let Dr. Berenice Primas know that we can do the Visco supplementation here after her arthroscopy, she has zero co-pay here. Pain is moderate, persistent, localized at the joint line.  Past medical history, Surgical history, Family history not pertinant except as noted below, Social history, Allergies, and medications have been entered into the medical record, reviewed, and no changes needed.   Review of Systems: No fevers, chills, night sweats, weight loss, chest pain, or shortness of breath.   Objective:    General: Well Developed, well nourished, and in no acute distress.  Neuro: Alert and oriented x3, extra-ocular muscles intact, sensation grossly intact.  HEENT: Normocephalic, atraumatic, pupils equal round reactive to light, neck supple, no masses, no lymphadenopathy, thyroid nonpalpable.  Skin: Warm and dry, no rashes. Cardiac: Regular rate and rhythm, no murmurs rubs or gallops, no lower extremity edema.  Respiratory: Clear to auscultation bilaterally. Not using accessory muscles, speaking in full sentences.  Impression and Recommendations:

## 2014-05-05 NOTE — Assessment & Plan Note (Signed)
Did see Dr. Berenice Primas, planned arthroscopy and then subsequent Visco supplementation. Her co-pay is cheaper here, she will followup with [place] for Visco supplementation after her arthroscopy.

## 2014-05-07 ENCOUNTER — Encounter: Payer: Self-pay | Admitting: Sports Medicine

## 2014-05-07 ENCOUNTER — Other Ambulatory Visit: Payer: Self-pay | Admitting: Sports Medicine

## 2014-06-02 ENCOUNTER — Telehealth: Payer: Self-pay | Admitting: Sports Medicine

## 2014-06-02 NOTE — Telephone Encounter (Signed)
Dr. Darene Lamer, do you know anything about gel shots for this patient. Please advise patient has blue cross blue shield. Rhonda Cunningham,CMA

## 2014-06-02 NOTE — Telephone Encounter (Signed)
Spoke to patient gave her information as noted below. Evelyn Sanchez,CMA

## 2014-06-02 NOTE — Telephone Encounter (Signed)
We were going to do the injections here after her arthroscopy, OrthoVisc is already approved, she does not need to take a prescription anywhere, we will simply use our own stock.

## 2014-06-02 NOTE — Telephone Encounter (Signed)
Pt called. She uses [facility] [address].prescription for for Gel shot was sent to another pharmacy.  Thank you.

## 2014-06-11 DIAGNOSIS — F314 Bipolar disorder, current episode depressed, severe, without psychotic features: Secondary | ICD-10-CM

## 2014-06-11 HISTORY — DX: Bipolar disorder, current episode depressed, severe, without psychotic features: F31.4

## 2014-08-17 ENCOUNTER — Encounter: Payer: Self-pay | Admitting: Sports Medicine

## 2014-11-12 ENCOUNTER — Encounter: Payer: Self-pay | Admitting: Sports Medicine

## 2014-11-12 ENCOUNTER — Ambulatory Visit (INDEPENDENT_AMBULATORY_CARE_PROVIDER_SITE_OTHER): Payer: Self-pay | Admitting: Sports Medicine

## 2014-11-12 ENCOUNTER — Ambulatory Visit (INDEPENDENT_AMBULATORY_CARE_PROVIDER_SITE_OTHER): Payer: BLUE CROSS/BLUE SHIELD

## 2014-11-12 VITALS — BP 120/73 | HR 76 | Ht 64.0 in | Wt 188.0 lb

## 2014-11-12 DIAGNOSIS — M25522 Pain in left elbow: Secondary | ICD-10-CM

## 2014-11-12 DIAGNOSIS — S52042A Displaced fracture of coronoid process of left ulna, initial encounter for closed fracture: Secondary | ICD-10-CM

## 2014-11-12 DIAGNOSIS — M1712 Unilateral primary osteoarthritis, left knee: Secondary | ICD-10-CM

## 2014-11-12 DIAGNOSIS — W19XXXA Unspecified fall, initial encounter: Secondary | ICD-10-CM

## 2014-11-12 HISTORY — DX: Displaced fracture of coronoid process of left ulna, initial encounter for closed fracture: S52.042A

## 2014-11-12 NOTE — Progress Notes (Signed)
  Subjective:    CC: Knee injection  HPI: Left knee osteoarthritis: Post arthroscopy, doing okay but still has some pain. We have been waiting until Orthovisc was improved, it is and so she is here for injection.  Left elbow pain: Had a fall, with pain over the dorsal and volar elbow joint with swelling, moderate, persistent. No radiation.  Past medical history, Surgical history, Family history not pertinant except as noted below, Social history, Allergies, and medications have been entered into the medical record, reviewed, and no changes needed.   Review of Systems: No fevers, chills, night sweats, weight loss, chest pain, or shortness of breath.   Objective:    General: Well Developed, well nourished, and in no acute distress.  Neuro: Alert and oriented x3, extra-ocular muscles intact, sensation grossly intact.  HEENT: Normocephalic, atraumatic, pupils equal round reactive to light, neck supple, no masses, no lymphadenopathy, thyroid nonpalpable.  Skin: Warm and dry, no rashes. Cardiac: Regular rate and rhythm, no murmurs rubs or gallops, no lower extremity edema.  Respiratory: Clear to auscultation bilaterally. Not using accessory muscles, speaking in full sentences. Left Elbow: Unremarkable to inspection. Range of motion full pronation, supination, flexion, extension. Strength is full to all of the above directions Stable to varus, valgus stress. Negative moving valgus stress test. Tender to palpation over the volar and dorsal ulna. Ulnar nerve does not sublux. Negative cubital tunnel Tinel's.  Procedure: Real-time Ultrasound Guided Injection of left knee Device: GE Logiq E  Verbal informed consent obtained.  Time-out conducted.  Noted no overlying erythema, induration, or other signs of local infection.  Skin prepped in a sterile fashion.  Local anesthesia: Topical Ethyl chloride.  With sterile technique and under real time ultrasound guidance:  1 mL kenalog 40, 4 mL  lidocaine injected easily, syringe switched and 30 mg/2 mL of OrthoVisc (sodium hyaluronate) in a prefilled syringe was injected easily into the knee through a 22-gauge needle. Completed without difficulty  Pain immediately resolved suggesting accurate placement of the medication.  Advised to call if fevers/chills, erythema, induration, drainage, or persistent bleeding.  Images permanently stored and available for review in the ultrasound unit.  Impression: Technically successful ultrasound guided injection.  X-rays show a small avulsion from the coronoid process.  Impression and Recommendations:

## 2014-11-12 NOTE — Assessment & Plan Note (Addendum)
X-rays. We will discuss this in the future.  X-rays do show an avulsion fracture from the coronoid process, unknown age, patient recommended to wear a sling.  I billed a fracture code for this encounter, all subsequent visits will be post-op checks in the global period.

## 2014-11-12 NOTE — Assessment & Plan Note (Signed)
Doing okay post arthroscopy but still with some pain. Steroid and Orthovisc injection #1 of 4 today into the left knee. Return in one week for #2.

## 2014-11-19 ENCOUNTER — Encounter: Payer: Self-pay | Admitting: Sports Medicine

## 2014-11-19 ENCOUNTER — Ambulatory Visit (INDEPENDENT_AMBULATORY_CARE_PROVIDER_SITE_OTHER): Payer: BLUE CROSS/BLUE SHIELD | Admitting: Sports Medicine

## 2014-11-19 VITALS — BP 131/77 | HR 69 | Wt 186.0 lb

## 2014-11-19 DIAGNOSIS — S52042D Displaced fracture of coronoid process of left ulna, subsequent encounter for closed fracture with routine healing: Secondary | ICD-10-CM

## 2014-11-19 DIAGNOSIS — M1712 Unilateral primary osteoarthritis, left knee: Secondary | ICD-10-CM | POA: Diagnosis not present

## 2014-11-19 NOTE — Assessment & Plan Note (Signed)
Continue the sling for 4 weeks

## 2014-11-19 NOTE — Progress Notes (Signed)
  Subjective:   Objective: General: Well-developed, well-nourished, and in no acute distress.  Assessment/plan:

## 2014-11-19 NOTE — Assessment & Plan Note (Signed)
Return in one week for Orthovisc injection #3. We are going to switch to a 4 point cane for stability.

## 2014-11-26 ENCOUNTER — Ambulatory Visit (INDEPENDENT_AMBULATORY_CARE_PROVIDER_SITE_OTHER): Payer: BLUE CROSS/BLUE SHIELD | Admitting: Sports Medicine

## 2014-11-26 ENCOUNTER — Encounter: Payer: Self-pay | Admitting: Sports Medicine

## 2014-11-26 VITALS — BP 121/80 | HR 76 | Wt 190.0 lb

## 2014-11-26 DIAGNOSIS — G5722 Lesion of femoral nerve, left lower limb: Secondary | ICD-10-CM | POA: Insufficient documentation

## 2014-11-26 DIAGNOSIS — M1712 Unilateral primary osteoarthritis, left knee: Secondary | ICD-10-CM | POA: Diagnosis not present

## 2014-11-26 DIAGNOSIS — R29898 Other symptoms and signs involving the musculoskeletal system: Secondary | ICD-10-CM

## 2014-11-26 HISTORY — DX: Lesion of femoral nerve, left lower limb: G57.22

## 2014-11-26 NOTE — Assessment & Plan Note (Signed)
There is profound weakness of the left lower extremity which is out of proportion to degree of osteoarthritis. There is also occasional paresthesias. I do not think that this degree of weakness is related entirely to lumbar radiculopathy, and I would like her to have a nerve conduction study and electromyography for further evaluation of a peripheral neuropathy versus myopathy. Surprisingly she does not have much quadriceps or gastrocnemius atrophy. She does have a 4 point cane for assistance. We have tried formal physical therapy, this was too expensive so she does the exercises at home.

## 2014-11-26 NOTE — Assessment & Plan Note (Signed)
Orthovisc injection #3 of 4 into the left knee. Return in one week for #4 of 4

## 2014-11-26 NOTE — Progress Notes (Signed)
  Subjective:    CC: Orthovisc injection  HPI: Left knee osteoarthritis: Here for Orthovisc injection #3 of 4. She has had fairly significant weakness of her left knee with buckling, she is post arthroscopy with partial meniscectomy, degree of weakness has been very out of proportion with the degree of internal derangement of her knee. She is only feeling slightly better with Orthovisc so far.  Past medical history, Surgical history, Family history not pertinant except as noted below, Social history, Allergies, and medications have been entered into the medical record, reviewed, and no changes needed.   Review of Systems: No fevers, chills, night sweats, weight loss, chest pain, or shortness of breath.   Objective:    General: Well Developed, well nourished, and in no acute distress.  Neuro: Alert and oriented x3, extra-ocular muscles intact, sensation grossly intact.  HEENT: Normocephalic, atraumatic, pupils equal round reactive to light, neck supple, no masses, no lymphadenopathy, thyroid nonpalpable.  Skin: Warm and dry, no rashes. Cardiac: Regular rate and rhythm, no murmurs rubs or gallops, no lower extremity edema.  Respiratory: Clear to auscultation bilaterally. Not using accessory muscles, speaking in full sentences. Left knee: Approximately 4 minus/5 strength with good antigravity musculature, she has no atrophy of her quadriceps or her gastrocnemius. Incision is intact with the exception of occasional numbness and tingling subjectively.  Procedure: Real-time Ultrasound Guided Injection of left knee Device: GE Logiq E  Verbal informed consent obtained.  Time-out conducted.  Noted no overlying erythema, induration, or other signs of local infection.  Skin prepped in a sterile fashion.  Local anesthesia: Topical Ethyl chloride.  With sterile technique and under real time ultrasound guidance:   30 mg/2 mL of OrthoVisc (sodium hyaluronate) in a prefilled syringe was injected easily  into the knee through a 22-gauge needle. Completed without difficulty  Pain immediately resolved suggesting accurate placement of the medication.  Advised to call if fevers/chills, erythema, induration, drainage, or persistent bleeding.  Images permanently stored and available for review in the ultrasound unit.  Impression: Technically successful ultrasound guided injection.  Impression and Recommendations:

## 2014-12-03 ENCOUNTER — Ambulatory Visit (INDEPENDENT_AMBULATORY_CARE_PROVIDER_SITE_OTHER): Payer: BLUE CROSS/BLUE SHIELD | Admitting: Sports Medicine

## 2014-12-03 ENCOUNTER — Encounter: Payer: Self-pay | Admitting: Sports Medicine

## 2014-12-03 VITALS — BP 127/82 | HR 75 | Ht 64.0 in | Wt 190.0 lb

## 2014-12-03 DIAGNOSIS — M1712 Unilateral primary osteoarthritis, left knee: Secondary | ICD-10-CM

## 2014-12-03 DIAGNOSIS — R29898 Other symptoms and signs involving the musculoskeletal system: Secondary | ICD-10-CM

## 2014-12-03 NOTE — Assessment & Plan Note (Signed)
Nerve conduction study coming up at the end of the month. She has a profound weakness of the left quadriceps, without atrophy. We are evaluating for a peripheral neuropathy versus myopathy.

## 2014-12-03 NOTE — Assessment & Plan Note (Signed)
Orthovisc No. 4 of 4 into the left knee.

## 2014-12-03 NOTE — Progress Notes (Signed)
  Subjective:    Procedure: Real-time Ultrasound Guided Injection of left knee Device: GE Logiq E  Verbal informed consent obtained.  Time-out conducted.  Noted no overlying erythema, induration, or other signs of local infection.  Skin prepped in a sterile fashion.  Local anesthesia: Topical Ethyl chloride.  With sterile technique and under real time ultrasound guidance:   30 mg/2 mL of OrthoVisc (sodium hyaluronate) in a prefilled syringe was injected easily into the knee through a 22-gauge needle. Completed without difficulty  Pain immediately resolved suggesting accurate placement of the medication.  Advised to call if fevers/chills, erythema, induration, drainage, or persistent bleeding.  Images permanently stored and available for review in the ultrasound unit.  Impression: Technically successful ultrasound guided injection.  Impression and Recommendations:

## 2015-04-12 ENCOUNTER — Other Ambulatory Visit: Payer: Self-pay | Admitting: Obstetrics & Gynecology

## 2015-04-12 NOTE — Telephone Encounter (Signed)
Patient needs to come in for visit before next refill

## 2015-06-01 ENCOUNTER — Telehealth: Payer: Self-pay

## 2015-06-01 NOTE — Telephone Encounter (Signed)
Chronic neurogenic changes to left vastus lateralis, could be old L4 radiculopathy, femoral neuropathy, focal muscle damage.  No other evidence of radiculopathy.

## 2015-06-01 NOTE — Telephone Encounter (Signed)
Patient called requested her results for a nerve conduction study she had done. Evelyn Sanchez,CMA

## 2015-06-02 NOTE — Telephone Encounter (Signed)
Spoke to patient gave her results as noted below. Anderson Middlebrooks,CMA

## 2015-06-07 ENCOUNTER — Ambulatory Visit (INDEPENDENT_AMBULATORY_CARE_PROVIDER_SITE_OTHER): Payer: BLUE CROSS/BLUE SHIELD

## 2015-06-07 ENCOUNTER — Encounter: Payer: Self-pay | Admitting: Sports Medicine

## 2015-06-07 ENCOUNTER — Ambulatory Visit (INDEPENDENT_AMBULATORY_CARE_PROVIDER_SITE_OTHER): Payer: BLUE CROSS/BLUE SHIELD | Admitting: Sports Medicine

## 2015-06-07 VITALS — BP 133/76 | HR 76 | Ht 64.0 in | Wt 194.0 lb

## 2015-06-07 DIAGNOSIS — G5722 Lesion of femoral nerve, left lower limb: Secondary | ICD-10-CM

## 2015-06-07 DIAGNOSIS — M67432 Ganglion, left wrist: Secondary | ICD-10-CM | POA: Diagnosis not present

## 2015-06-07 DIAGNOSIS — M47896 Other spondylosis, lumbar region: Secondary | ICD-10-CM | POA: Diagnosis not present

## 2015-06-07 NOTE — Progress Notes (Signed)
  Subjective:    CC: Follow-up  HPI: Left leg weakness: With left quadriceps atrophy, nerve conduction showed chronic neurogenic changes to the left vastus lateralis, this was unable to be discerned between L4 radiculopathy versus femoral neuropathy. She does endorse some back pain, moderate, persistent with radiation. She has already failed months of physician directed redilatation.  Left wrist pain: Over the dorsal wrist compartment. There is a palpable mass.  Past medical history, Surgical history, Family history not pertinant except as noted below, Social history, Allergies, and medications have been entered into the medical record, reviewed, and no changes needed.   Review of Systems: No fevers, chills, night sweats, weight loss, chest pain, or shortness of breath.   Objective:    General: Well Developed, well nourished, and in no acute distress.  Neuro: Alert and oriented x3, extra-ocular muscles intact, sensation grossly intact.  HEENT: Normocephalic, atraumatic, pupils equal round reactive to light, neck supple, no masses, no lymphadenopathy, thyroid nonpalpable.  Skin: Warm and dry, no rashes. Cardiac: Regular rate and rhythm, no murmurs rubs or gallops, no lower extremity edema.  Respiratory: Clear to auscultation bilaterally. Not using accessory muscles, speaking in full sentences.  Procedure: Real-time Ultrasound Guided Injection of left dorsal ganglion cyst Device: GE Logiq E  Verbal informed consent obtained.  Time-out conducted.  Noted no overlying erythema, induration, or other signs of local infection.  Skin prepped in a sterile fashion.  Local anesthesia: Topical Ethyl chloride.  With sterile technique and under real time ultrasound guidance:  25-gauge needle advanced into the ganglion cyst and medication was injected Completed without difficulty  Pain immediately resolved suggesting accurate placement of the medication.  Advised to call if fevers/chills, erythema,  induration, drainage, or persistent bleeding.  Images permanently stored and available for review in the ultrasound unit.  Impression: Technically successful ultrasound guided injection.  Procedure: Real-time Ultrasound Guided Injection of left wrist joint, radiocarpal Device: GE Logiq E  Verbal informed consent obtained.  Time-out conducted.  Noted no overlying erythema, induration, or other signs of local infection.  Skin prepped in a sterile fashion.  Local anesthesia: Topical Ethyl chloride.  With sterile technique and under real time ultrasound guidance:  Needle was redirected from the above injection into the radiocarpal joint and additional amount of medication was injected Completed without difficulty  Pain immediately resolved suggesting accurate placement of the medication.  Advised to call if fevers/chills, erythema, induration, drainage, or persistent bleeding.  Images permanently stored and available for review in the ultrasound unit.  Impression: Technically successful ultrasound guided injection.  Procedure: Real-time Ultrasound Guided Injection of left fourth extensor compartment Device: GE Logiq E  Verbal informed consent obtained.  Time-out conducted.  Noted no overlying erythema, induration, or other signs of local infection.  Skin prepped in a sterile fashion.  Local anesthesia: Topical Ethyl chloride.  With sterile technique and under real time ultrasound guidance:  I also noted some tenosynovitis in the fourth extensor compartment and the needle was again redirected into the fourth extensor compartment and medication was again injected. Completed without difficulty  Pain immediately resolved suggesting accurate placement of the medication.  Advised to call if fevers/chills, erythema, induration, drainage, or persistent bleeding.  Images permanently stored and available for review in the ultrasound unit.  Impression: Technically successful ultrasound guided  injection.  I used a total of 1 mL kenalog 40, 1 mL lidocaine with the above injections  Impression and Recommendations:

## 2015-06-07 NOTE — Assessment & Plan Note (Signed)
Nerve conduction simply shows chronic neurogenic changes to the left vastus lateralis, old L4 radiculopathy versus femoral neuropathy. She does have significant back pain, I am going to obtain an MRI of her lumbar spine as well as the pelvis looking for femoral nerve entrapment versus L4 impingement. Return to see me to go over results of the MRIs.

## 2015-06-07 NOTE — Assessment & Plan Note (Addendum)
Injection and fenestration as above.  The needle was redirected and I also placed injections into the fourth extensor compartment as well as radiocarpal joint.

## 2015-06-14 ENCOUNTER — Telehealth: Payer: Self-pay

## 2015-06-14 ENCOUNTER — Ambulatory Visit: Payer: BLUE CROSS/BLUE SHIELD

## 2015-06-14 MED ORDER — DIAZEPAM 5 MG PO TABS: ORAL_TABLET | ORAL | Status: DC

## 2015-06-14 NOTE — Telephone Encounter (Signed)
Prescription for Valium inbox.

## 2015-06-14 NOTE — Telephone Encounter (Signed)
Patient called stated that she was supposed to get MRI done today and was unable to get it done due her anxiety level being high. Patient rescheduled it for Tuesday so she is requesting something sent in to her pharmacy that she can taketo help relax her. Kallyn Demarcus,CMA

## 2015-06-15 NOTE — Telephone Encounter (Signed)
Spoke to patient advised her that rx Valium has been faxed to her pharmacy. Evelyn Sanchez,CMA

## 2015-06-22 ENCOUNTER — Ambulatory Visit (INDEPENDENT_AMBULATORY_CARE_PROVIDER_SITE_OTHER): Payer: BLUE CROSS/BLUE SHIELD

## 2015-06-22 DIAGNOSIS — M1288 Other specific arthropathies, not elsewhere classified, other specified site: Secondary | ICD-10-CM | POA: Diagnosis not present

## 2015-06-22 DIAGNOSIS — M4806 Spinal stenosis, lumbar region: Secondary | ICD-10-CM | POA: Diagnosis not present

## 2015-06-23 ENCOUNTER — Ambulatory Visit (INDEPENDENT_AMBULATORY_CARE_PROVIDER_SITE_OTHER): Payer: BLUE CROSS/BLUE SHIELD | Admitting: Sports Medicine

## 2015-06-23 ENCOUNTER — Encounter: Payer: Self-pay | Admitting: Sports Medicine

## 2015-06-23 VITALS — BP 143/84 | HR 77 | Wt 188.0 lb

## 2015-06-23 DIAGNOSIS — G5722 Lesion of femoral nerve, left lower limb: Secondary | ICD-10-CM | POA: Diagnosis not present

## 2015-06-23 DIAGNOSIS — M1712 Unilateral primary osteoarthritis, left knee: Secondary | ICD-10-CM

## 2015-06-23 NOTE — Assessment & Plan Note (Signed)
At this point we have done a fairly extensive workup regarding her left quadriceps weakness and atrophy. Nerve conduction did show chronic neurogenic changes to the left vastus lateralis that could represent an old L4 radiculopathy, femoral neuropathy, versus focal muscle damage. An MRI of the pelvis showed a good and clear path of the L4 nerve root on the left, as well as discussion with the radiologist today showed a good and clear path of the femoral nerve all the way through its course to the quadriceps. In an effort not to continue chasing zebras, we are going to further workup her left knee osteoarthritis, we have injected this in a long time. She'll be very aware as to whether quadriceps type pain improves after the injection, to help a separate whether this is related to the knee, or simply  Unfortunately the next step would be biopsy the quadriceps muscle itself.

## 2015-06-23 NOTE — Progress Notes (Signed)
  Subjective:    CC: follow-up  HPI: Left quad atrophy: Also with left knee osteoarthritis, we have done a fairly extensive workup including nerve conduction and EMG, this showed chronic neurogenic changes and to the left vastus lateralis that could represent either L4 radiculopathy, femoral neuropathy versus focal muscle damage. More recently she had a pelvic and lumbar spine MRI. The results of which will be dictated below. Pain continues to be moderate, persistent and localized at the joint line. No mechanical symptoms, previous injection was over 7 months ago.  Past medical history, Surgical history, Family history not pertinant except as noted below, Social history, Allergies, and medications have been entered into the medical record, reviewed, and no changes needed.   Review of Systems: No fevers, chills, night sweats, weight loss, chest pain, or shortness of breath.   Objective:    General: Well Developed, well nourished, and in no acute distress.  Neuro: Alert and oriented x3, extra-ocular muscles intact, sensation grossly intact.  HEENT: Normocephalic, atraumatic, pupils equal round reactive to light, neck supple, no masses, no lymphadenopathy, thyroid nonpalpable.  Skin: Warm and dry, no rashes. Cardiac: Regular rate and rhythm, no murmurs rubs or gallops, no lower extremity edema.  Respiratory: Clear to auscultation bilaterally. Not using accessory muscles, speaking in full sentences.  MRI reviewed, both in person and with the radiologist on the phone, L4 nerve root is completely free and clear, there is also no abnormality along the entire course of the femoral nerve to the quadriceps.  Procedure: Real-time Ultrasound Guided Injection of left knee Device: GE Logiq E  Verbal informed consent obtained.  Time-out conducted.  Noted no overlying erythema, induration, or other signs of local infection.  Skin prepped in a sterile fashion.  Local anesthesia: Topical Ethyl chloride.    With sterile technique and under real time ultrasound guidance:1 mL kenalog 40, 4 mL lidocaine injected easily   Completed without difficulty  Pain immediately resolved suggesting accurate placement of the medication.  Advised to call if fevers/chills, erythema, induration, drainage, or persistent bleeding.  Images permanently stored and available for review in the ultrasound unit.  Impression: Technically successful ultrasound guided injection.  Impression and Recommendations:    I spent 25 minutes with this patient, greater than 50% was face-to-face time counseling regarding the above diagnoses

## 2015-07-05 ENCOUNTER — Ambulatory Visit: Payer: Self-pay | Admitting: Sports Medicine

## 2015-07-21 ENCOUNTER — Ambulatory Visit: Payer: Self-pay | Admitting: Sports Medicine

## 2015-07-22 ENCOUNTER — Ambulatory Visit: Payer: Self-pay | Admitting: Sports Medicine

## 2015-07-28 ENCOUNTER — Encounter: Payer: Self-pay | Admitting: Sports Medicine

## 2015-07-28 ENCOUNTER — Ambulatory Visit (INDEPENDENT_AMBULATORY_CARE_PROVIDER_SITE_OTHER): Payer: BLUE CROSS/BLUE SHIELD | Admitting: Sports Medicine

## 2015-07-28 DIAGNOSIS — G5722 Lesion of femoral nerve, left lower limb: Secondary | ICD-10-CM | POA: Diagnosis not present

## 2015-07-28 MED ORDER — PREGABALIN 75 MG PO CAPS
75.0000 mg | ORAL_CAPSULE | Freq: Two times a day (BID) | ORAL | Status: DC
Start: 2015-07-28 — End: 2015-09-20

## 2015-07-28 NOTE — Assessment & Plan Note (Signed)
At this point we have done a fairly extensive workup regarding her left quadriceps weakness and atrophy. Nerve conduction did show chronic neurogenic changes to the left vastus lateralis that could represent an old L4 radiculopathy, femoral neuropathy, versus focal muscle damage. An MRI of the pelvis showed a good and clear path of the L4 nerve root on the left, as well as discussion with the radiologist today showed a good and clear path of the femoral nerve all the way through its course to the quadriceps. In an effort not to continue chasing zebras, we are going to further workup her left knee osteoarthritis, we injected her left knee at the last visit and this resulted in no relief of her knee or quadriceps pain. She started to have the pain in the other knee, she is starting Opana with pain management and I am going to add some Lyrica samples.

## 2015-07-28 NOTE — Progress Notes (Signed)
  Subjective:    CC: Follow-up  HPI: Left knee pain: With  quadriceps atrophy. Knee osteoarthritis, postarthroscopy on the left side, unfortunately with persistent pain, medial joint line and under the patella. A diagnostic and therapeutic left knee injection provided no relief, not even temporary suggesting that the articular surface is not the pain generator. Pain is moderate, persistent. It has spread to the contralateral knee. She is seen pain management and has recently started Opana, currently gabapentin and Opana together provide minimal relief. She is currently undergoing approval for Lyrica but has not yet tried any.  Past medical history, Surgical history, Family history not pertinant except as noted below, Social history, Allergies, and medications have been entered into the medical record, reviewed, and no changes needed.   Review of Systems: No fevers, chills, night sweats, weight loss, chest pain, or shortness of breath.   Objective:    General: Well Developed, well nourished, and in no acute distress.  Neuro: Alert and oriented x3, extra-ocular muscles intact, sensation grossly intact.  HEENT: Normocephalic, atraumatic, pupils equal round reactive to light, neck supple, no masses, no lymphadenopathy, thyroid nonpalpable.  Skin: Warm and dry, no rashes. Cardiac: Regular rate and rhythm, no murmurs rubs or gallops, no lower extremity edema.  Respiratory: Clear to auscultation bilaterally. Not using accessory muscles, speaking in full sentences. Bilateral Knee: Normal to inspection with no erythema or effusion or obvious bony abnormalities. Palpation normal with no warmth or joint line tenderness or patellar tenderness or condyle tenderness. ROM normal in flexion and extension and lower leg rotation. Ligaments with solid consistent endpoints including ACL, PCL, LCL, MCL. Negative Mcmurray's and provocative meniscal tests. Non painful patellar compression. Patellar and quadriceps  tendons unremarkable. Hamstring and quadriceps strength is normal.  Impression and Recommendations:   I spent 25 minutes with this patient, greater than 50% was face-to-face time counseling regarding the above diagnoses

## 2015-08-26 ENCOUNTER — Ambulatory Visit: Payer: Self-pay | Admitting: Sports Medicine

## 2015-09-07 ENCOUNTER — Ambulatory Visit: Payer: Self-pay | Admitting: Sports Medicine

## 2015-09-20 ENCOUNTER — Encounter: Payer: Self-pay | Admitting: Obstetrics & Gynecology

## 2015-09-20 ENCOUNTER — Ambulatory Visit (INDEPENDENT_AMBULATORY_CARE_PROVIDER_SITE_OTHER): Payer: BLUE CROSS/BLUE SHIELD | Admitting: Sports Medicine

## 2015-09-20 ENCOUNTER — Encounter: Payer: Self-pay | Admitting: Sports Medicine

## 2015-09-20 ENCOUNTER — Ambulatory Visit (INDEPENDENT_AMBULATORY_CARE_PROVIDER_SITE_OTHER): Payer: BLUE CROSS/BLUE SHIELD | Admitting: Obstetrics & Gynecology

## 2015-09-20 VITALS — BP 144/88 | HR 79 | Resp 16 | Ht 64.0 in | Wt 190.0 lb

## 2015-09-20 VITALS — BP 143/94 | HR 81 | Wt 189.0 lb

## 2015-09-20 DIAGNOSIS — L918 Other hypertrophic disorders of the skin: Secondary | ICD-10-CM | POA: Diagnosis not present

## 2015-09-20 DIAGNOSIS — M1712 Unilateral primary osteoarthritis, left knee: Secondary | ICD-10-CM

## 2015-09-20 DIAGNOSIS — Z124 Encounter for screening for malignant neoplasm of cervix: Secondary | ICD-10-CM

## 2015-09-20 DIAGNOSIS — Z1151 Encounter for screening for human papillomavirus (HPV): Secondary | ICD-10-CM

## 2015-09-20 DIAGNOSIS — Z113 Encounter for screening for infections with a predominantly sexual mode of transmission: Secondary | ICD-10-CM

## 2015-09-20 DIAGNOSIS — Z01419 Encounter for gynecological examination (general) (routine) without abnormal findings: Secondary | ICD-10-CM

## 2015-09-20 DIAGNOSIS — N766 Ulceration of vulva: Secondary | ICD-10-CM | POA: Diagnosis not present

## 2015-09-20 DIAGNOSIS — Z0000 Encounter for general adult medical examination without abnormal findings: Secondary | ICD-10-CM

## 2015-09-20 NOTE — Progress Notes (Signed)
Subjective:    Evelyn Sanchez is a 39 y.o.  MW P2 (41 and 41 yo kids) female who presents for an annual exam. The patient has no complaints today. The patient is sexually active. GYN screening history: last pap: was normal. The patient wears seatbelts: yes. The patient participates in regular exercise: no. Has the patient ever been transfused or tattooed?: no. The patient reports that there is not domestic violence in her life.   Menstrual History: OB History    Gravida Para Term Preterm AB TAB SAB Ectopic Multiple Living   4 2 2  2  2   2       Menarche age: 50  No LMP recorded. Patient has had an ablation.    The following portions of the patient's history were reviewed and updated as appropriate: allergies, current medications, past family history, past medical history, past social history, past surgical history and problem list.  Review of Systems Pertinent items noted in HPI and remainder of comprehensive ROS otherwise negative.   Very strong FH of various cancers She has lots of vaginal dryness and uses lubrication She has occasional periods, months apart Married for 18 years, monogamous for 22 years Trying to get disability No mammogram She had her flu vaccine 10/16   Objective:    BP 144/88 mmHg  Pulse 79  Resp 16  Ht 5' 4"  (1.626 m)  Wt 190 lb (86.183 kg)  BMI 32.60 kg/m2  General Appearance:    Alert, cooperative, no distress, appears stated age  Head:    Normocephalic, without obvious abnormality, atraumatic  Eyes:    PERRL, conjunctiva/corneas clear, EOM's intact, fundi    benign, both eyes  Ears:    Normal TM's and external ear canals, both ears  Nose:   Nares normal, septum midline, mucosa normal, no drainage    or sinus tenderness  Throat:   Lips, mucosa, and tongue normal; teeth and gums normal  Neck:   Supple, symmetrical, trachea midline, no adenopathy;    thyroid:  no enlargement/tenderness/nodules; no carotid   bruit or JVD  Back:     Symmetric, no  curvature, ROM normal, no CVA tenderness  Lungs:     Clear to auscultation bilaterally, respirations unlabored  Chest Wall:    No tenderness or deformity   Heart:    Regular rate and rhythm, S1 and S2 normal, no murmur, rub   or gallop  Breast Exam:    No tenderness, masses, or nipple abnormality  Abdomen:     Soft, non-tender, bowel sounds active all four quadrants,    no masses, no organomegaly  Genitalia:    Normal female without lesion, discharge or tenderness, small ulceration on bottom of right labium majus (She says that this happens not infrequently), globular mobile uterus, ULN size, NT, non-palpable adnexae     Extremities:   Extremities normal, atraumatic, no cyanosis or edema  Pulses:   2+ and symmetric all extremities  Skin:   Skin color, texture, turgor normal, no rashes or lesions  Lymph nodes:   Cervical, supraclavicular, and axillary nodes normal  Neurologic:   CNII-XII intact, normal strength, sensation and reflexes    throughout  .    Assessment:    Healthy female exam.    Plan:     rec My Risk test   Pap with cotesting Mammogram

## 2015-09-20 NOTE — Addendum Note (Signed)
Addended by: Gretchen Short on: 09/20/2015 03:11 PM   Modules accepted: Orders

## 2015-09-20 NOTE — Progress Notes (Signed)
  Subjective:    CC: Left knee pain  HPI: Evelyn Sanchez returns, she continues to have pain that she localizes on the lateral aspect of her left knee, severe, persistent. She is postop arthroscopy with partial meniscectomy, she has known osteoarthritis. Unfortunately continues to have pain. She has not responded to steroid injections or Visco supplementation in the recent past.  Skin tag: Would like this frozen  Past medical history, Surgical history, Family history not pertinant except as noted below, Social history, Allergies, and medications have been entered into the medical record, reviewed, and no changes needed.   Review of Systems: No fevers, chills, night sweats, weight loss, chest pain, or shortness of breath.   Objective:    General: Well Developed, well nourished, and in no acute distress.  Neuro: Alert and oriented x3, extra-ocular muscles intact, sensation grossly intact.  HEENT: Normocephalic, atraumatic, pupils equal round reactive to light, neck supple, no masses, no lymphadenopathy, thyroid nonpalpable.  Skin: Warm and dry, no rashes. Cardiac: Regular rate and rhythm, no murmurs rubs or gallops, no lower extremity edema.  Respiratory: Clear to auscultation bilaterally. Not using accessory muscles, speaking in full sentences. Left Knee: Normal to inspection with no erythema or effusion or obvious bony abnormalities. Tender to palpation at the lateral patellar facet. ROM normal in flexion and extension and lower leg rotation. Ligaments with solid consistent endpoints including ACL, PCL, LCL, MCL. Negative Mcmurray's and provocative meniscal tests. Non painful patellar compression. Patellar and quadriceps tendons unremarkable. Hamstring and quadriceps strength is normal.  Procedure:  Cryodestruction of left anterior elbow skin tag Consent obtained and verified. Time-out conducted. Noted no overlying erythema, induration, or other signs of local infection. Completed without  difficulty using Cryo-Gun. Advised to call if fevers/chills, erythema, induration, drainage, or persistent bleeding.  Impression and Recommendations:

## 2015-09-20 NOTE — Assessment & Plan Note (Signed)
Cryotherapy as above.

## 2015-09-20 NOTE — Assessment & Plan Note (Signed)
Insufficient response is to steroid injections or viscous supplementation. She is postop, we are going to obtain a new MRI for further evaluation. Also like her to work on some physical therapy in the meantime.

## 2015-09-21 ENCOUNTER — Telehealth: Payer: Self-pay | Admitting: *Deleted

## 2015-09-21 ENCOUNTER — Ambulatory Visit: Payer: Self-pay | Admitting: Sports Medicine

## 2015-09-21 DIAGNOSIS — B009 Herpesviral infection, unspecified: Secondary | ICD-10-CM

## 2015-09-21 LAB — HSV 2 ANTIBODY, IGG: HSV 2 Glycoprotein G Ab, IgG: 4.75 IV — ABNORMAL HIGH

## 2015-09-21 MED ORDER — VALACYCLOVIR HCL 1 G PO TABS: 1000.0000 mg | ORAL_TABLET | Freq: Every day | ORAL | Status: DC

## 2015-09-21 NOTE — Telephone Encounter (Signed)
Called pt to adv lab results and meds in pharm

## 2015-09-23 LAB — CYTOLOGY - PAP

## 2015-09-27 ENCOUNTER — Other Ambulatory Visit: Payer: Self-pay

## 2015-09-29 ENCOUNTER — Ambulatory Visit: Payer: Self-pay

## 2015-09-30 ENCOUNTER — Telehealth: Payer: Self-pay

## 2015-09-30 MED ORDER — DIAZEPAM 5 MG PO TABS: ORAL_TABLET | ORAL | Status: DC

## 2015-09-30 NOTE — Telephone Encounter (Signed)
Pt has her MRI scheduled for Monday would like to know if she can get something for her nerves for the procedure. Please advise.

## 2015-09-30 NOTE — Telephone Encounter (Signed)
Rx for valium in my box.

## 2015-10-04 ENCOUNTER — Ambulatory Visit (INDEPENDENT_AMBULATORY_CARE_PROVIDER_SITE_OTHER): Payer: BLUE CROSS/BLUE SHIELD

## 2015-10-04 ENCOUNTER — Encounter: Payer: Self-pay | Admitting: *Deleted

## 2015-10-04 DIAGNOSIS — S83242A Other tear of medial meniscus, current injury, left knee, initial encounter: Secondary | ICD-10-CM

## 2015-10-04 DIAGNOSIS — M1712 Unilateral primary osteoarthritis, left knee: Secondary | ICD-10-CM

## 2015-10-04 DIAGNOSIS — M7652 Patellar tendinitis, left knee: Secondary | ICD-10-CM

## 2015-10-04 DIAGNOSIS — X58XXXA Exposure to other specified factors, initial encounter: Secondary | ICD-10-CM | POA: Diagnosis not present

## 2015-10-05 ENCOUNTER — Telehealth: Payer: Self-pay

## 2015-10-05 NOTE — Telephone Encounter (Signed)
Yes she should.

## 2015-10-05 NOTE — Telephone Encounter (Signed)
Pt called for results of MRI.  After results given, she wanted to know if she should make an appointment with Dr. Berenice Primas?

## 2015-10-06 NOTE — Telephone Encounter (Signed)
Left patient a message on cell to make an appointment with Dr. Berenice Primas

## 2015-10-19 ENCOUNTER — Ambulatory Visit: Payer: Self-pay | Admitting: Sports Medicine

## 2015-10-25 ENCOUNTER — Ambulatory Visit: Payer: Self-pay | Admitting: Sports Medicine

## 2015-11-01 ENCOUNTER — Ambulatory Visit: Payer: Self-pay | Admitting: Sports Medicine

## 2015-11-09 ENCOUNTER — Ambulatory Visit (INDEPENDENT_AMBULATORY_CARE_PROVIDER_SITE_OTHER): Payer: BLUE CROSS/BLUE SHIELD | Admitting: Sports Medicine

## 2015-11-09 ENCOUNTER — Encounter: Payer: Self-pay | Admitting: Sports Medicine

## 2015-11-09 VITALS — BP 135/80 | HR 80 | Temp 98.3°F | Resp 18 | Wt 196.7 lb

## 2015-11-09 DIAGNOSIS — M1712 Unilateral primary osteoarthritis, left knee: Secondary | ICD-10-CM

## 2015-11-09 NOTE — Progress Notes (Signed)
  Subjective:    CC: left knee osteoarthritis  HPI: This is a pleasant 40 year old female, she returns, she unfortunately has persistent pain in her left knee despite multiple arthroscopies, steroid injections, physical therapy, Visco supplementation, NSAIDs, and narcotics. At this point she is agreeable to consider total knee arthroplasty.  Past medical history, Surgical history, Family history not pertinant except as noted below, Social history, Allergies, and medications have been entered into the medical record, reviewed, and no changes needed.   Review of Systems: No fevers, chills, night sweats, weight loss, chest pain, or shortness of breath.   Objective:    General: Well Developed, well nourished, and in no acute distress.  Neuro: Alert and oriented x3, extra-ocular muscles intact, sensation grossly intact.  HEENT: Normocephalic, atraumatic, pupils equal round reactive to light, neck supple, no masses, no lymphadenopathy, thyroid nonpalpable.  Skin: Warm and dry, no rashes. Cardiac: Regular rate and rhythm, no murmurs rubs or gallops, no lower extremity edema.  Respiratory: Clear to auscultation bilaterally. Not using accessory muscles, speaking in full sentences. Left Knee: Tender to palpation of the medial and lateral patellar facets as well as the medial joint line. ROM normal in flexion and extension and lower leg rotation. Ligaments with solid consistent endpoints including ACL, PCL, LCL, MCL. Positive McMurray's with pain but no click Non painful patellar compression. Patellar and quadriceps tendons unremarkable. Hamstring and quadriceps strength is normal.  Impression and Recommendations:

## 2015-11-09 NOTE — Assessment & Plan Note (Addendum)
At this point has had failure of steroid injections, physical therapy, NSAIDs, Visco supplementation, arthroscopy 3, she needs a knee replacement.  MRI shows severe tricompartment osteoarthritis with multiple meniscal tears despite previous operative intervention.  Referral to Dr. Ballard Russell.

## 2015-11-11 ENCOUNTER — Encounter: Payer: Self-pay | Admitting: Sports Medicine

## 2016-02-29 ENCOUNTER — Ambulatory Visit: Payer: Self-pay | Admitting: Sports Medicine

## 2016-03-02 ENCOUNTER — Ambulatory Visit (INDEPENDENT_AMBULATORY_CARE_PROVIDER_SITE_OTHER): Payer: BLUE CROSS/BLUE SHIELD

## 2016-03-02 ENCOUNTER — Encounter: Payer: Self-pay | Admitting: Sports Medicine

## 2016-03-02 ENCOUNTER — Ambulatory Visit: Payer: Self-pay | Admitting: Sports Medicine

## 2016-03-02 ENCOUNTER — Ambulatory Visit (INDEPENDENT_AMBULATORY_CARE_PROVIDER_SITE_OTHER): Payer: BLUE CROSS/BLUE SHIELD | Admitting: Sports Medicine

## 2016-03-02 VITALS — BP 137/83 | HR 64 | Resp 18 | Wt 183.9 lb

## 2016-03-02 DIAGNOSIS — R208 Other disturbances of skin sensation: Secondary | ICD-10-CM

## 2016-03-02 DIAGNOSIS — R200 Anesthesia of skin: Secondary | ICD-10-CM

## 2016-03-02 HISTORY — DX: Anesthesia of skin: R20.0

## 2016-03-02 NOTE — Progress Notes (Signed)
  Subjective:    CC: Hand numbness  HPI: This is a pleasant 40 year old female,for the past several weeks she's had increasing numbness that she localizes on both the dorsal and volar aspects of both forearms,with numbness and tingling into the fingers, minimal neck pain. Symptoms are moderate, persistent.  Past medical history, Surgical history, Family history not pertinant except as noted below, Social history, Allergies, and medications have been entered into the medical record, reviewed, and no changes needed.   Review of Systems: No fevers, chills, night sweats, weight loss, chest pain, or shortness of breath.   Objective:    General: Well Developed, well nourished, and in no acute distress.  Neuro: Alert and oriented x3, extra-ocular muscles intact, sensation grossly intact.  HEENT: Normocephalic, atraumatic, pupils equal round reactive to light, neck supple, no masses, no lymphadenopathy, thyroid nonpalpable.  Skin: Warm and dry, no rashes. Cardiac: Regular rate and rhythm, no murmurs rubs or gallops, no lower extremity edema.  Respiratory: Clear to auscultation bilaterally. Not using accessory muscles, speaking in full sentences. Bilateral Wrist: Inspection normal with no visible erythema or swelling. ROM smooth and normal with good flexion and extension and ulnar/radial deviation that is symmetrical with opposite wrist. Palpation is normal over metacarpals, navicular, lunate, and TFCC; tendons without tenderness/ swelling No snuffbox tenderness. No tenderness over Canal of Gu[place]. Strength 5/5 in all directions without pain. Negative Finkelstein, negative Phalen sign, positive Tinel sign Negative Watson's test.  Impression and Recommendations:

## 2016-03-02 NOTE — Assessment & Plan Note (Signed)
Most likely carpal tunnel syndrome, she will obtain bilateral nighttime splints over-the-counter. I'm going to x-ray her neck. Return to see me in one month, median nerve hydrodissection if no better.

## 2016-03-03 ENCOUNTER — Encounter: Payer: Self-pay | Admitting: Sports Medicine

## 2016-03-14 ENCOUNTER — Telehealth: Payer: Self-pay

## 2016-03-14 NOTE — Telephone Encounter (Signed)
Went over results or x-ray with patient again and she asked about nerve conduction test looked in chart and didn't see test ordered. Please assist.

## 2016-03-15 NOTE — Telephone Encounter (Signed)
There is one from 05/24/2015 that she and I went over in the office, it showed femoral neuropathy, no evidence of a pinched nerve in the back.

## 2016-03-20 ENCOUNTER — Encounter: Payer: Self-pay | Admitting: Sports Medicine

## 2016-03-20 DIAGNOSIS — R202 Paresthesia of skin: Secondary | ICD-10-CM

## 2016-03-21 NOTE — Addendum Note (Signed)
Addended by: Silverio Decamp on: 03/21/2016 09:07 AM   Modules accepted: Orders

## 2016-03-23 ENCOUNTER — Encounter: Payer: Self-pay | Admitting: Sports Medicine

## 2016-03-27 ENCOUNTER — Other Ambulatory Visit: Payer: Self-pay

## 2016-03-27 DIAGNOSIS — R202 Paresthesia of skin: Secondary | ICD-10-CM

## 2016-03-30 ENCOUNTER — Ambulatory Visit: Payer: Self-pay | Admitting: Sports Medicine

## 2016-04-06 ENCOUNTER — Ambulatory Visit (INDEPENDENT_AMBULATORY_CARE_PROVIDER_SITE_OTHER): Payer: BLUE CROSS/BLUE SHIELD | Admitting: Sports Medicine

## 2016-04-06 ENCOUNTER — Encounter: Payer: Self-pay | Admitting: Sports Medicine

## 2016-04-06 VITALS — BP 138/82 | HR 73 | Resp 18 | Wt 183.1 lb

## 2016-04-06 DIAGNOSIS — R208 Other disturbances of skin sensation: Secondary | ICD-10-CM

## 2016-04-06 DIAGNOSIS — G894 Chronic pain syndrome: Secondary | ICD-10-CM | POA: Diagnosis not present

## 2016-04-06 DIAGNOSIS — R200 Anesthesia of skin: Secondary | ICD-10-CM

## 2016-04-06 HISTORY — DX: Chronic pain syndrome: G89.4

## 2016-04-06 NOTE — Progress Notes (Signed)
  Subjective:    CC:  Follow-up  HPI: Bilateral hand numbness: Suspected carpal tunnel syndrome, nighttime splinting provided good relief of the left side but not the right. She has her nerve conduction study coming up next month. Pain is moderate, persistent and she desires some relief today.  Past medical history, Surgical history, Family history not pertinant except as noted below, Social history, Allergies, and medications have been entered into the medical record, reviewed, and no changes needed.   Review of Systems: No fevers, chills, night sweats, weight loss, chest pain, or shortness of breath.   Objective:    General: Well Developed, well nourished, and in no acute distress.  Neuro: Alert and oriented x3, extra-ocular muscles intact, sensation grossly intact.  HEENT: Normocephalic, atraumatic, pupils equal round reactive to light, neck supple, no masses, no lymphadenopathy, thyroid nonpalpable.  Skin: Warm and dry, no rashes. Cardiac: Regular rate and rhythm, no murmurs rubs or gallops, no lower extremity edema.  Respiratory: Clear to auscultation bilaterally. Not using accessory muscles, speaking in full sentences.  Procedure: Real-time Ultrasound Guided Injection of right median nerve hydrodissection Device: GE Logiq E  Verbal informed consent obtained.  Time-out conducted.  Noted no overlying erythema, induration, or other signs of local infection.  Skin prepped in a sterile fashion.  Local anesthesia: Topical Ethyl chloride.  With sterile technique and under real time ultrasound guidance:  25-gauge needle advanced into the carpal tunnel and I injected medication both superficial to and deep to the median nerve free from's running structures, the needle was then redirected deep into the carpal tunnel and further medication was injected for a total of 1 mL kenalog 40, 5 mL lidocaine. Completed without difficulty  Pain immediately resolved suggesting accurate placement of the  medication.  Advised to call if fevers/chills, erythema, induration, drainage, or persistent bleeding.  Images permanently stored and available for review in the ultrasound unit.  Impression: Technically successful ultrasound guided injection.  Impression and Recommendations:

## 2016-04-06 NOTE — Assessment & Plan Note (Signed)
Combination of knee pain, femoral neuropathy, carpal tunnel syndrome, patient desires referral to [facility].

## 2016-04-06 NOTE — Assessment & Plan Note (Signed)
Symptoms were suspicious for carpal tunnel syndrome, left side has improved with nighttime splinting, right side is still symptomatically and she does have her nerve conduction study coming up. Considering persistent pain despite conservative measures we are going to proceed with a right-sided median nerve hydrodissection.  Return to see me in one month.

## 2016-04-27 ENCOUNTER — Telehealth: Payer: Self-pay

## 2016-04-27 ENCOUNTER — Encounter: Payer: Self-pay | Admitting: Neurology

## 2016-04-27 ENCOUNTER — Telehealth: Payer: Self-pay | Admitting: Neurology

## 2016-04-27 NOTE — Telephone Encounter (Signed)
Pt cancelled her EMG appt on the same day d/t car trouble.

## 2016-04-27 NOTE — Telephone Encounter (Signed)
Pt called to cancel her appt for today due to car trouble. She wanted to r/s however I am unable to find an EMG slot. Can you help with this? She will need a call back if/when r/s'd.

## 2016-04-28 NOTE — Telephone Encounter (Signed)
Pt is an outside referral so can be scheduled with any of the providers.

## 2016-05-01 ENCOUNTER — Encounter: Payer: Self-pay | Admitting: Neurology

## 2016-05-11 ENCOUNTER — Ambulatory Visit: Payer: Self-pay | Admitting: Sports Medicine

## 2016-05-15 ENCOUNTER — Encounter: Payer: Self-pay | Admitting: Sports Medicine

## 2016-07-06 ENCOUNTER — Telehealth: Payer: Self-pay

## 2016-07-06 NOTE — Telephone Encounter (Signed)
Pt is applying for disability and is in need of a letter stating that due to her current conditions she is unable to work. Please assist.

## 2016-07-07 ENCOUNTER — Encounter: Payer: Self-pay | Admitting: Sports Medicine

## 2016-07-07 NOTE — Telephone Encounter (Signed)
Letter in box.

## 2016-07-11 ENCOUNTER — Encounter: Payer: Self-pay | Admitting: Neurology

## 2016-10-03 ENCOUNTER — Ambulatory Visit (INDEPENDENT_AMBULATORY_CARE_PROVIDER_SITE_OTHER): Payer: Medicaid Other | Admitting: Physician Assistant

## 2016-10-03 ENCOUNTER — Encounter: Payer: Self-pay | Admitting: Physician Assistant

## 2016-10-03 ENCOUNTER — Ambulatory Visit (INDEPENDENT_AMBULATORY_CARE_PROVIDER_SITE_OTHER): Payer: Medicaid Other

## 2016-10-03 VITALS — BP 166/89 | HR 82

## 2016-10-03 DIAGNOSIS — G894 Chronic pain syndrome: Secondary | ICD-10-CM | POA: Diagnosis not present

## 2016-10-03 DIAGNOSIS — S8992XA Unspecified injury of left lower leg, initial encounter: Secondary | ICD-10-CM | POA: Diagnosis not present

## 2016-10-03 DIAGNOSIS — M1712 Unilateral primary osteoarthritis, left knee: Secondary | ICD-10-CM

## 2016-10-03 MED ORDER — TRAZODONE HCL 100 MG PO TABS: 100.0000 mg | ORAL_TABLET | Freq: Every day | ORAL | 5 refills | Status: DC

## 2016-10-03 MED ORDER — MELOXICAM 15 MG PO TABS: 15.0000 mg | ORAL_TABLET | Freq: Every day | ORAL | 3 refills | Status: DC

## 2016-10-03 MED ORDER — OXYCODONE-ACETAMINOPHEN 10-325 MG PO TABS
1.0000 | ORAL_TABLET | Freq: Three times a day (TID) | ORAL | 0 refills | Status: DC | PRN
Start: 2016-10-03 — End: 2016-11-15

## 2016-10-03 MED ORDER — RIZATRIPTAN BENZOATE 10 MG PO TABS: 10.0000 mg | ORAL_TABLET | ORAL | 5 refills | Status: DC | PRN

## 2016-10-03 NOTE — Progress Notes (Signed)
   Subjective:    Patient ID: Evelyn Sanchez, female    DOB: 1976/06/07, 40 y.o.   MRN: [id number]  HPI  Pt is a 40 yo female who presents to the clinic right left knee pain after fall today directly on left knee. Pt has a long hx of left knee problems with OA and mensical tears. She needs surgery even before fall. She just got medicaid from not having any insurance. She has been off all her medications. She has failed steroid injections and orthovisc. She needs knee replacement but they feel she is too young.    Review of Systems  All other systems reviewed and are negative.      Objective:   Physical Exam  Constitutional: She appears well-developed and well-nourished.  HENT:  Head: Normocephalic and atraumatic.  Cardiovascular: Normal rate, regular rhythm and normal heart sounds.   Pulmonary/Chest: Effort normal and breath sounds normal.  Musculoskeletal:  2cm by 2cm abrasion over left patella.  ROM limited due to pain.  Effusion noted supralateral left knee.  Tenderness to palpation over patella and bilateral joint space.  positive McMurray.           Assessment & Plan:  Evelyn KitchenMarland KitchenAnneliese was seen today for fall, back pain and knee pain.  Diagnoses and all orders for this visit:  Left knee injury, initial encounter -     meloxicam (MOBIC) 15 MG tablet; Take 1 tablet (15 mg total) by mouth daily. -     DG Knee 4 Views W/Patella Left; Future -     oxyCODONE-acetaminophen (PERCOCET) 10-325 MG tablet; Take 1 tablet by mouth every 8 (eight) hours as needed for pain. -     DG Knee 4 Views W/Patella Left -     Ambulatory referral to Pain Clinic  Primary osteoarthritis of left knee -     meloxicam (MOBIC) 15 MG tablet; Take 1 tablet (15 mg total) by mouth daily. -     DG Knee 4 Views W/Patella Left; Future -     oxyCODONE-acetaminophen (PERCOCET) 10-325 MG tablet; Take 1 tablet by mouth every 8 (eight) hours as needed for pain. -     DG Knee 4 Views W/Patella Left -     Ambulatory  referral to Pain Clinic  Chronic pain syndrome -     meloxicam (MOBIC) 15 MG tablet; Take 1 tablet (15 mg total) by mouth daily. -     oxyCODONE-acetaminophen (PERCOCET) 10-325 MG tablet; Take 1 tablet by mouth every 8 (eight) hours as needed for pain. -     Ambulatory referral to Pain Clinic  Other orders -     traZODone (DESYREL) 100 MG tablet; Take 1 tablet (100 mg total) by mouth at bedtime. -     rizatriptan (MAXALT) 10 MG tablet; Take 1 tablet (10 mg total) by mouth as needed for migraine. May repeat in 2 hours if needed   Small 7 day quanity of narcotic filled. Pain clinic referral made.  Follow up with surgeon with next step with knee.  Wrapped in ace bandage. Discussed ice, elevation.  mobic given.

## 2016-10-03 NOTE — Patient Instructions (Signed)
Will make referral for pain clinic.  Follow up with orthopedic.  Restart mobic.  Ice and compress.  Get xray today.

## 2016-10-04 NOTE — Progress Notes (Signed)
Call pt: small loose body in lateral compartment. I would follow up with orthopedic.

## 2016-10-05 ENCOUNTER — Encounter: Payer: Self-pay | Admitting: Physician Assistant

## 2016-10-10 ENCOUNTER — Other Ambulatory Visit: Payer: Self-pay | Admitting: Physician Assistant

## 2016-10-10 DIAGNOSIS — M1712 Unilateral primary osteoarthritis, left knee: Secondary | ICD-10-CM

## 2016-10-19 ENCOUNTER — Encounter: Payer: Self-pay | Admitting: Sports Medicine

## 2016-10-19 DIAGNOSIS — R200 Anesthesia of skin: Secondary | ICD-10-CM

## 2016-10-19 DIAGNOSIS — R202 Paresthesia of skin: Principal | ICD-10-CM

## 2016-11-15 ENCOUNTER — Ambulatory Visit (INDEPENDENT_AMBULATORY_CARE_PROVIDER_SITE_OTHER): Payer: BLUE CROSS/BLUE SHIELD | Admitting: Sports Medicine

## 2016-11-15 ENCOUNTER — Encounter: Payer: Self-pay | Admitting: Sports Medicine

## 2016-11-15 DIAGNOSIS — Z20828 Contact with and (suspected) exposure to other viral communicable diseases: Secondary | ICD-10-CM

## 2016-11-15 DIAGNOSIS — Z23 Encounter for immunization: Secondary | ICD-10-CM

## 2016-11-15 DIAGNOSIS — G43909 Migraine, unspecified, not intractable, without status migrainosus: Secondary | ICD-10-CM

## 2016-11-15 DIAGNOSIS — M7711 Lateral epicondylitis, right elbow: Secondary | ICD-10-CM

## 2016-11-15 HISTORY — DX: Lateral epicondylitis, right elbow: M77.11

## 2016-11-15 MED ORDER — MELOXICAM 15 MG PO TABS: ORAL_TABLET | ORAL | 3 refills | Status: DC

## 2016-11-15 MED ORDER — OSELTAMIVIR PHOSPHATE 75 MG PO CAPS: 75.0000 mg | ORAL_CAPSULE | Freq: Two times a day (BID) | ORAL | 0 refills | Status: DC

## 2016-11-15 MED ORDER — TOPIRAMATE ER 100 MG PO CAP24: 1.0000 | ORAL_CAPSULE | Freq: Every day | ORAL | 11 refills | Status: DC

## 2016-11-15 NOTE — Progress Notes (Signed)
  Subjective:    CC: Multiple issues  HPI: Headaches: Clinical diagnoses migraines by both me and neurology, she was placed on Topamax 150 immediate release with neurology, it looks as though she's had some Maxalt in the past. She tells her she needs a refill on Topamax. Headaches are posterior, right-sided, throbbing with photophobia, phonophobia, nausea. She does have a negative brain MRI.  Right elbow pain: Localized over the lateral epicondyle, moderate, persistent with radiation to the dorsal forearm. Overall carpal tunnel syndromes are doing better.  She tells me that she was exposed to influenza by 2 other family members, would like to know she can get a flu shot. She really has no symptoms at this time.  Past medical history:  Negative.  See flowsheet/record as well for more information.  Surgical history: Negative.  See flowsheet/record as well for more information.  Family history: Negative.  See flowsheet/record as well for more information.  Social history: Negative.  See flowsheet/record as well for more information.  Allergies, and medications have been entered into the medical record, reviewed, and no changes needed.   Review of Systems: No fevers, chills, night sweats, weight loss, chest pain, or shortness of breath.   Objective:    General: Well Developed, well nourished, and in no acute distress.  Neuro: Alert and oriented x3, extra-ocular muscles intact, sensation grossly intact. Cranial nerves II through XII are intact, motor, sensory, coordinative functions are all grossly intact. HEENT: Normocephalic, atraumatic, pupils equal round reactive to light, neck supple, no masses, no lymphadenopathy, thyroid nonpalpable.  Skin: Warm and dry, no rashes. Cardiac: Regular rate and rhythm, no murmurs rubs or gallops, no lower extremity edema.  Respiratory: Clear to auscultation bilaterally. Not using accessory muscles, speaking in full sentences. Right Elbow: Unremarkable to  inspection. Range of motion full pronation, supination, flexion, extension. Strength is full to all of the above directions Stable to varus, valgus stress. Negative moving valgus stress test. Discretely tender to palpation at the common extensor tendon origin at the lateral epicondyle with  Reproduction of pain with resisted extension of the middle finger. Ulnar nerve does not sublux. Negative cubital tunnel Tinel's.  Impression and Recommendations:    Lateral epicondylitis of right elbow Counter force brace, rehabilitation exercises, meloxicam. Return in one month, injection if no better.  Migraines Patient tells me she went to neurology, I have no record of this, symptoms sound like migraines. She has migraine specific medication but does not remove the name of it, she will find out the name of it and let me know then I can refill it. She also tells me she had a MRI of the brain that was unrevealing. On review of her neurology records from sometime ago it seems as though she was prescribed both Topamax and Maxalt. I'm going to refill her Topamax (switching to extended release), she needs to let me know about Maxalt.  Exposure to influenza Shot given today, Tamiflu.

## 2016-11-15 NOTE — Assessment & Plan Note (Signed)
Shot given today, Tamiflu.

## 2016-11-15 NOTE — Assessment & Plan Note (Addendum)
Patient tells me she went to neurology, I have no record of this, symptoms sound like migraines. She has migraine specific medication but does not remove the name of it, she will find out the name of it and let me know then I can refill it. She also tells me she had a MRI of the brain that was unrevealing. On review of her neurology records from sometime ago it seems as though she was prescribed both Topamax and Maxalt. I'm going to refill her Topamax (switching to extended release), she needs to let me know about Maxalt.

## 2016-11-15 NOTE — Assessment & Plan Note (Signed)
Counter force brace, rehabilitation exercises, meloxicam. Return in one month, injection if no better.

## 2016-11-28 ENCOUNTER — Telehealth: Payer: Self-pay | Admitting: *Deleted

## 2016-11-28 NOTE — Telephone Encounter (Signed)
PA has been approved through [facility] [place] # [id number] approved from 11/28/2016-11/23/2017. Notified pharm and left message for patient  Tried and failed topiramate,maxalt,gabapentin

## 2016-12-13 ENCOUNTER — Ambulatory Visit (INDEPENDENT_AMBULATORY_CARE_PROVIDER_SITE_OTHER): Payer: BLUE CROSS/BLUE SHIELD | Admitting: Sports Medicine

## 2016-12-13 ENCOUNTER — Encounter: Payer: Self-pay | Admitting: Sports Medicine

## 2016-12-13 VITALS — BP 145/87 | HR 71 | Temp 98.0°F | Ht 64.0 in | Wt 204.6 lb

## 2016-12-13 DIAGNOSIS — Z23 Encounter for immunization: Secondary | ICD-10-CM

## 2016-12-13 DIAGNOSIS — M7711 Lateral epicondylitis, right elbow: Secondary | ICD-10-CM

## 2016-12-13 NOTE — Progress Notes (Signed)
  Subjective:    CC: Follow-up  HPI: Right elbow pain: Lateral epicondylitis, no better with rehabilitation exercises, NSAIDs, counterforce brace. Pain is severe, persistent, localized without radiation, desires injection.  Past medical history:  Negative.  See flowsheet/record as well for more information.  Surgical history: Negative.  See flowsheet/record as well for more information.  Family history: Negative.  See flowsheet/record as well for more information.  Social history: Negative.  See flowsheet/record as well for more information.  Allergies, and medications have been entered into the medical record, reviewed, and no changes needed.   Review of Systems: No fevers, chills, night sweats, weight loss, chest pain, or shortness of breath.   Objective:    General: Well Developed, well nourished, and in no acute distress.  Neuro: Alert and oriented x3, extra-ocular muscles intact, sensation grossly intact.  HEENT: Normocephalic, atraumatic, pupils equal round reactive to light, neck supple, no masses, no lymphadenopathy, thyroid nonpalpable.  Skin: Warm and dry, no rashes. Cardiac: Regular rate and rhythm, no murmurs rubs or gallops, no lower extremity edema.  Respiratory: Clear to auscultation bilaterally. Not using accessory muscles, speaking in full sentences. Right Elbow: Unremarkable to inspection. Range of motion full pronation, supination, flexion, extension. Strength is full to all of the above directions Stable to varus, valgus stress. Negative moving valgus stress test. Tender to palpation of the common extensor tendon origin Ulnar nerve does not sublux. Negative cubital tunnel Tinel's.  Procedure: Real-time Ultrasound Guided Injection of right common extensor tendon origin Device: GE Logiq E  Verbal informed consent obtained.  Time-out conducted.  Noted no overlying erythema, induration, or other signs of local infection.  Skin prepped in a sterile fashion.  Local  anesthesia: Topical Ethyl chloride.  With sterile technique and under real time ultrasound guidance: Noted hypoechoic change at the proximal deep origin of the tendon, consistent with tearing. 25-gauge needle advanced to the origin and I injected a total of 1 mL kenalog 40, 1 mL lidocaine, 1 mL bupivacaine both superficial to and deep to the common extensor tendon. I performed a very mild percutaneous tenotomy at the origin near the tearing as well.  Completed without difficulty  Pain immediately resolved suggesting accurate placement of the medication.  Advised to call if fevers/chills, erythema, induration, drainage, or persistent bleeding.  Images permanently stored and available for review in the ultrasound unit.  Impression: Technically successful ultrasound guided injection.  Impression and Recommendations:    Lateral epicondylitis of right elbow Unfortunately has failed conservative measures, injection as above. Return in one month.

## 2016-12-13 NOTE — Addendum Note (Signed)
Addended by: Marily Lente on: 12/13/2016 11:47 AM   Modules accepted: Orders

## 2016-12-13 NOTE — Assessment & Plan Note (Signed)
Unfortunately has failed conservative measures, injection as above. Return in one month.

## 2017-01-10 ENCOUNTER — Encounter: Payer: Self-pay | Admitting: Sports Medicine

## 2017-01-10 ENCOUNTER — Ambulatory Visit (INDEPENDENT_AMBULATORY_CARE_PROVIDER_SITE_OTHER): Payer: Medicaid Other | Admitting: Sports Medicine

## 2017-01-10 DIAGNOSIS — M7711 Lateral epicondylitis, right elbow: Secondary | ICD-10-CM | POA: Diagnosis not present

## 2017-01-10 NOTE — Assessment & Plan Note (Signed)
Pain relief until just recently, she will do a Velcro wrist brace which she already has at home. She will also be more diligent with her rehabilitation exercises. If no improvement after a couple of weeks we can proceed with PRP, she understands that we need to do a 30 minute visit if she is going to proceed with PRP injection.

## 2017-01-10 NOTE — Progress Notes (Signed)
  Subjective:    CC: Follow-up  HPI: Right lateral epicondylitis: Improvement, with good pain relief until recently. No injuries but now has a recurrence of pain at the common extensor tendon origin on the right. Moderate, worsening. No paresthesias.  Past medical history:  Negative.  See flowsheet/record as well for more information.  Surgical history: Negative.  See flowsheet/record as well for more information.  Family history: Negative.  See flowsheet/record as well for more information.  Social history: Negative.  See flowsheet/record as well for more information.  Allergies, and medications have been entered into the medical record, reviewed, and no changes needed.   Review of Systems: No fevers, chills, night sweats, weight loss, chest pain, or shortness of breath.   Objective:    General: Well Developed, well nourished, and in no acute distress.  Neuro: Alert and oriented x3, extra-ocular muscles intact, sensation grossly intact.  HEENT: Normocephalic, atraumatic, pupils equal round reactive to light, neck supple, no masses, no lymphadenopathy, thyroid nonpalpable.  Skin: Warm and dry, no rashes. Cardiac: Regular rate and rhythm, no murmurs rubs or gallops, no lower extremity edema.  Respiratory: Clear to auscultation bilaterally. Not using accessory muscles, speaking in full sentences. Right Elbow: Unremarkable to inspection. Range of motion full pronation, supination, flexion, extension. Strength is full to all of the above directions Stable to varus, valgus stress. Negative moving valgus stress test. Tender to palpation at the common extensor tendon origin with reproduction of pain with resisted extension of the wrist Ulnar nerve does not sublux. Negative cubital tunnel Tinel's.  Impression and Recommendations:    Lateral epicondylitis of right elbow Pain relief until just recently, she will do a Velcro wrist brace which she already has at home. She will also be more  diligent with her rehabilitation exercises. If no improvement after a couple of weeks we can proceed with PRP, she understands that we need to do a 30 minute visit if she is going to proceed with PRP injection.  I spent 25 minutes with this patient, greater than 50% was face-to-face time counseling regarding the above diagnoses

## 2017-01-26 ENCOUNTER — Ambulatory Visit (INDEPENDENT_AMBULATORY_CARE_PROVIDER_SITE_OTHER): Payer: Self-pay | Admitting: Sports Medicine

## 2017-01-26 ENCOUNTER — Encounter: Payer: Self-pay | Admitting: Sports Medicine

## 2017-01-26 DIAGNOSIS — M7711 Lateral epicondylitis, right elbow: Secondary | ICD-10-CM

## 2017-01-26 MED ORDER — HYDROCODONE-ACETAMINOPHEN 5-325 MG PO TABS: 1.0000 | ORAL_TABLET | Freq: Three times a day (TID) | ORAL | 0 refills | Status: DC | PRN

## 2017-01-26 NOTE — Assessment & Plan Note (Addendum)
Failure of multiple conservative measures including injections, physical therapy, medications. PRP percutaneous tenotomy as above. Hydrocodone for postprocedural pain, return to see me in one month.  [place] controlled substance reporting system reviewed prior to prescription. Patient will avoid NSAIDs until she sees me again.

## 2017-01-26 NOTE — Progress Notes (Signed)
  Procedure: Real-time Ultrasound Guided Platelet Rich Plasma (PRP) Injection of  Device: GE Logiq E  Verbal informed consent obtained.  Time-out conducted.  Noted no overlying erythema, induration, or other signs of local infection.  Obtained 30 cc of blood from peripheral vein, using the "PEAK" centrifuge, red blood cells were separated from the plasma. Subsequently red blood cells were drained leaving only plasma with the buffy coat layer between the desired lines. Platelet poor plasma was then centrifuged out, and remaining platelet rich plasma aspirated into a 5 cc syringe.  Skin prepped in a sterile fashion.  Local anesthesia: Topical Ethyl chloride.  With sterile technique and under real time ultrasound guidance the platelet rich plasma (PRP) obtained above:  I first injected 1 mL lidocaine and 1 mL bupivacaine superficial to and deep to the common extensor tendon origin, then using the PRP syringe I performed a percutaneous needle tenotomy with over 50 punctures through the proximal tendon while injecting platelet rich plasma. Completed without difficulty  Advised to call if fevers/chills, erythema, induration, drainage, or persistent bleeding.  Images permanently stored and available for review in the ultrasound unit.  Impression: Technically successful ultrasound guided Platelet Rich Plasma (PRP) injection.  I spent 40 minutes with this patient, greater than 50% was face-to-face time counseling and procedural time regarding the below diagnosis.

## 2017-02-05 ENCOUNTER — Telehealth: Payer: Self-pay | Admitting: *Deleted

## 2017-02-05 NOTE — Telephone Encounter (Signed)
PA form faxed to [place] [facility] for hydrocodone [phone].

## 2017-02-23 ENCOUNTER — Ambulatory Visit: Payer: Medicaid Other | Admitting: Sports Medicine

## 2017-03-07 ENCOUNTER — Ambulatory Visit: Payer: Medicaid Other | Admitting: Sports Medicine

## 2017-03-08 ENCOUNTER — Ambulatory Visit (INDEPENDENT_AMBULATORY_CARE_PROVIDER_SITE_OTHER): Payer: Medicaid Other | Admitting: Sports Medicine

## 2017-03-08 ENCOUNTER — Encounter: Payer: Self-pay | Admitting: Sports Medicine

## 2017-03-08 DIAGNOSIS — M4696 Unspecified inflammatory spondylopathy, lumbar region: Secondary | ICD-10-CM

## 2017-03-08 DIAGNOSIS — M47816 Spondylosis without myelopathy or radiculopathy, lumbar region: Secondary | ICD-10-CM

## 2017-03-08 HISTORY — DX: Spondylosis without myelopathy or radiculopathy, lumbar region: M47.816

## 2017-03-08 NOTE — Assessment & Plan Note (Signed)
Bilateral right worse than left facetogenic type back pain, we will start conservatively with NSAIDs and home rehabilitation exercises before proceeding to bilateral L4-S1 facet joint injections.

## 2017-03-08 NOTE — Progress Notes (Signed)
  Subjective:    CC: right lower back pain  HPI: Evelyn Sanchez is a 41 y.o. Female with a history of obesity, OA, lateral epicondylitis who presents with right lower back pain that radiates laterally to the knee. The pain is worse moving around and on standing and is relieved on sitting and leaning forward. Pain is 5/10 and is described as a constant throbbing pain. Pt reports the referred pain down to the knee feels similar to when she had sciatica during pregnancy. She denies any constitutional symptoms, bowel or urinary incontinence. Pt has been taking meloxicam for her other MSK complaints.   Past medical history:  Negative.  See flowsheet/record as well for more information.  Surgical history: Negative.  See flowsheet/record as well for more information.  Family history: Negative.  See flowsheet/record as well for more information.  Social history: Negative.  See flowsheet/record as well for more information.  Allergies, and medications have been entered into the medical record, reviewed, and no changes needed.   Review of Systems: No fevers, chills, night sweats, weight loss, chest pain, or shortness of breath.   Objective:    General: Well Developed, well nourished, and in no acute distress.  Neuro: Alert and oriented x3, extra-ocular muscles intact, sensation grossly intact.  HEENT: Normocephalic, atraumatic, pupils equal round reactive to light, neck supple, no masses, no lymphadenopathy, thyroid nonpalpable.  Skin: Warm and dry, no rashes. Cardiac: Regular rate and rhythm, no murmurs rubs or gallops, no lower extremity edema.  Respiratory: Clear to auscultation bilaterally. Not using accessory muscles, speaking in full sentences. MSK: TTP at lumbar spine, radiation follows L5 distribution to the knee. Full ROM in all planes at hip and knee joints bilaterally. Right hip flexion 4/5, extension 5/5, abduction 5/5, adduction 5/5. Right knee flexion 4/5, extension 4/5. Left hip and knee  5/5 in all planes. Passive internal and external right hip rotation reproduced pain. Patellar and tibial pulses 1+ bilaterally.    Impression and Recommendations:    Evelyn Sanchez is a 41 y.o. Female with a history of obesity, OA, and lateral epicondylitis who presents with lumbar facet arthropathy.  Start conservatively with Meloxicam that pt has already been prescribed. Pt provided with home rehabilitation exercises. If pain persists facet joint injections may be indicated.   Follow-up in one month.

## 2017-04-05 ENCOUNTER — Encounter: Payer: Self-pay | Admitting: Sports Medicine

## 2017-04-05 ENCOUNTER — Ambulatory Visit (INDEPENDENT_AMBULATORY_CARE_PROVIDER_SITE_OTHER): Payer: Medicaid Other | Admitting: Sports Medicine

## 2017-04-05 DIAGNOSIS — M47816 Spondylosis without myelopathy or radiculopathy, lumbar region: Secondary | ICD-10-CM

## 2017-04-05 DIAGNOSIS — M4696 Unspecified inflammatory spondylopathy, lumbar region: Secondary | ICD-10-CM | POA: Diagnosis not present

## 2017-04-05 DIAGNOSIS — G894 Chronic pain syndrome: Secondary | ICD-10-CM

## 2017-04-05 MED ORDER — IBUPROFEN 800 MG PO TABS: 800.0000 mg | ORAL_TABLET | Freq: Three times a day (TID) | ORAL | 2 refills | Status: DC | PRN

## 2017-04-05 MED ORDER — ACETAMINOPHEN ER 650 MG PO TBCR: 1300.0000 mg | EXTENDED_RELEASE_TABLET | Freq: Three times a day (TID) | ORAL | 3 refills | Status: DC | PRN

## 2017-04-05 MED ORDER — ESOMEPRAZOLE MAGNESIUM 40 MG PO CPDR: 40.0000 mg | DELAYED_RELEASE_CAPSULE | Freq: Two times a day (BID) | ORAL | 3 refills | Status: DC

## 2017-04-05 NOTE — Assessment & Plan Note (Signed)
Does not desire to continue narcotic pain management. Is also getting some gastritis, adding Nexium 40 mg twice a day, she will continue to do ibuprofen and Tylenol.

## 2017-04-05 NOTE — Progress Notes (Signed)
  Subjective:    CC: Low back pain  HPI: Evelyn Sanchez returns, she has multilevel facet arthritis, we tried all the conservative step, persistently has pain and so is agreeable to proceed with facet joint injections, pain is moderate, persistent, localized without radiation, worse with standing, twisting, extension, with gelling in the morning, no bowel or bladder dysfunction, saddle numbness, constitutional symptoms.  She's been doing a lot of NSAIDs, and is complaining of mild epigastric pain without melena, hematochezia, hematemesis.  Past medical history:  Negative.  See flowsheet/record as well for more information.  Surgical history: Negative.  See flowsheet/record as well for more information.  Family history: Negative.  See flowsheet/record as well for more information.  Social history: Negative.  See flowsheet/record as well for more information.  Allergies, and medications have been entered into the medical record, reviewed, and no changes needed.   Review of Systems: No fevers, chills, night sweats, weight loss, chest pain, or shortness of breath.   Objective:    General: Well Developed, well nourished, and in no acute distress.  Neuro: Alert and oriented x3, extra-ocular muscles intact, sensation grossly intact.  HEENT: Normocephalic, atraumatic, pupils equal round reactive to light, neck supple, no masses, no lymphadenopathy, thyroid nonpalpable.  Skin: Warm and dry, no rashes. Cardiac: Regular rate and rhythm, no murmurs rubs or gallops, no lower extremity edema.  Respiratory: Clear to auscultation bilaterally. Not using accessory muscles, speaking in full sentences. Abdomen: Soft, minimally tender in the epigastrium, nondistended, normal bowel sounds, no palpable masses, no guarding, rigidity, rebound pain.  Impression and Recommendations:    Lumbar facet arthropathy (HCC) Right worse than left facetogenic type back pain, she's had facet joint injections in the distant past  that seems to provide a good response, at this point has failed conservative measures we are going to proceed with bilateral L4-S1 facet joint injections, return in one month after the injections to evaluate response and to further discuss radiofrequency ablation.  Chronic pain syndrome Does not desire to continue narcotic pain management. Is also getting some gastritis, adding Nexium 40 mg twice a day, she will continue to do ibuprofen and Tylenol.  I spent 40 minutes with this patient, greater than 50% was face-to-face time counseling regarding the above diagnoses

## 2017-04-05 NOTE — Assessment & Plan Note (Signed)
Right worse than left facetogenic type back pain, she's had facet joint injections in the distant past that seems to provide a good response, at this point has failed conservative measures we are going to proceed with bilateral L4-S1 facet joint injections, return in one month after the injections to evaluate response and to further discuss radiofrequency ablation.

## 2017-04-11 ENCOUNTER — Ambulatory Visit
Admission: RE | Admit: 2017-04-11 | Discharge: 2017-04-11 | Disposition: A | Discharge status: Home / Self Care | Payer: Medicaid Other | Source: Ambulatory Visit | Attending: Sports Medicine | Admitting: Sports Medicine

## 2017-04-11 ENCOUNTER — Other Ambulatory Visit: Payer: Self-pay | Admitting: Sports Medicine

## 2017-04-11 DIAGNOSIS — M47816 Spondylosis without myelopathy or radiculopathy, lumbar region: Secondary | ICD-10-CM

## 2017-04-11 MED ORDER — IOPAMIDOL (ISOVUE-M 200) INJECTION 41%
1.0000 mL | Freq: Once | INTRAMUSCULAR | Status: AC
  Administered 2017-04-11: 1 mL via INTRA_ARTICULAR

## 2017-04-11 MED ORDER — METHYLPREDNISOLONE ACETATE 40 MG/ML INJ SUSP (RADIOLOG
120.0000 mg | Freq: Once | INTRAMUSCULAR | Status: AC
  Administered 2017-04-11: 120 mg via INTRA_ARTICULAR

## 2017-04-11 NOTE — Discharge Instructions (Signed)

## 2017-05-07 ENCOUNTER — Encounter: Payer: Self-pay | Admitting: Sports Medicine

## 2017-05-07 ENCOUNTER — Ambulatory Visit (INDEPENDENT_AMBULATORY_CARE_PROVIDER_SITE_OTHER): Payer: Medicaid Other | Admitting: Sports Medicine

## 2017-05-07 DIAGNOSIS — M4696 Unspecified inflammatory spondylopathy, lumbar region: Secondary | ICD-10-CM

## 2017-05-07 DIAGNOSIS — M47816 Spondylosis without myelopathy or radiculopathy, lumbar region: Secondary | ICD-10-CM

## 2017-05-07 DIAGNOSIS — M7711 Lateral epicondylitis, right elbow: Secondary | ICD-10-CM | POA: Diagnosis not present

## 2017-05-07 NOTE — Assessment & Plan Note (Signed)
Fantastic response, temporary though to L4-S1 bilateral facet joint injections. I do think it's due time that we proceed to medial branch blocks and radiofrequency ablation.

## 2017-05-07 NOTE — Assessment & Plan Note (Signed)
Unfortunately persistent pain, 3 months after PRP percutaneous tenotomy, at this point she's failed multiple steroid injections with ultrasound guidance, formal physical therapy, activity modification, multiple medications. She is now a candidate for operative intervention. Referral placed.

## 2017-05-07 NOTE — Progress Notes (Signed)
  Subjective:    CC: Follow-up  HPI: Lumbar spondylosis: Excellent response with near complete pain relief, temporarily after bilateral L4-S1 facet joint injections. Now having a return of similar pain. Injections were over month ago.  Right tennis elbow: Unfortunately at this point has failed physical therapy, activity modification, bracing, multiple steroid injections, a ultrasound guided PRP pertinent is tenotomy, with recurrence of pain.  Past medical history:  Negative.  See flowsheet/record as well for more information.  Surgical history: Negative.  See flowsheet/record as well for more information.  Family history: Negative.  See flowsheet/record as well for more information.  Social history: Negative.  See flowsheet/record as well for more information.  Allergies, and medications have been entered into the medical record, reviewed, and no changes needed.   Review of Systems: No fevers, chills, night sweats, weight loss, chest pain, or shortness of breath.   Objective:    General: Well Developed, well nourished, and in no acute distress.  Neuro: Alert and oriented x3, extra-ocular muscles intact, sensation grossly intact.  HEENT: Normocephalic, atraumatic, pupils equal round reactive to light, neck supple, no masses, no lymphadenopathy, thyroid nonpalpable.  Skin: Warm and dry, no rashes. Cardiac: Regular rate and rhythm, no murmurs rubs or gallops, no lower extremity edema.  Respiratory: Clear to auscultation bilaterally. Not using accessory muscles, speaking in full sentences. Right Elbow: Unremarkable to inspection. Range of motion full pronation, supination, flexion, extension. Strength is full to all of the above directions Stable to varus, valgus stress. Negative moving valgus stress test. Tender to palpation of the common extensor tendon origin Ulnar nerve does not sublux. Negative cubital tunnel Tinel's.  Impression and Recommendations:    Lumbar facet arthropathy  (HCC) Fantastic response, temporary though to L4-S1 bilateral facet joint injections. I do think it's due time that we proceed to medial branch blocks and radiofrequency ablation.  Lateral epicondylitis of right elbow Unfortunately persistent pain, 3 months after PRP percutaneous tenotomy, at this point she's failed multiple steroid injections with ultrasound guidance, formal physical therapy, activity modification, multiple medications. She is now a candidate for operative intervention. Referral placed.  I spent 25 minutes with this patient, greater than 50% was face-to-face time counseling regarding the above diagnoses

## 2017-05-08 ENCOUNTER — Other Ambulatory Visit: Payer: Self-pay | Admitting: Sports Medicine

## 2017-05-08 DIAGNOSIS — M47816 Spondylosis without myelopathy or radiculopathy, lumbar region: Secondary | ICD-10-CM

## 2017-05-11 ENCOUNTER — Ambulatory Visit: Payer: Self-pay | Admitting: Sports Medicine

## 2017-07-16 ENCOUNTER — Ambulatory Visit
Admission: RE | Admit: 2017-07-16 | Discharge: 2017-07-16 | Disposition: A | Discharge status: Home / Self Care | Payer: Medicaid Other | Source: Ambulatory Visit | Attending: Sports Medicine | Admitting: Sports Medicine

## 2017-07-16 ENCOUNTER — Other Ambulatory Visit: Payer: Self-pay | Admitting: Sports Medicine

## 2017-07-16 DIAGNOSIS — M47816 Spondylosis without myelopathy or radiculopathy, lumbar region: Secondary | ICD-10-CM

## 2017-07-23 DIAGNOSIS — M94262 Chondromalacia, left knee: Secondary | ICD-10-CM | POA: Insufficient documentation

## 2017-07-24 ENCOUNTER — Other Ambulatory Visit: Payer: Self-pay | Admitting: Sports Medicine

## 2017-07-24 ENCOUNTER — Ambulatory Visit
Admission: RE | Admit: 2017-07-24 | Discharge: 2017-07-24 | Disposition: A | Discharge status: Home / Self Care | Payer: Medicaid Other | Source: Ambulatory Visit | Attending: Sports Medicine | Admitting: Sports Medicine

## 2017-07-24 DIAGNOSIS — M47816 Spondylosis without myelopathy or radiculopathy, lumbar region: Secondary | ICD-10-CM

## 2017-07-24 MED ORDER — SODIUM CHLORIDE 0.9 % IV SOLN
Freq: Once | INTRAVENOUS | Status: AC
  Administered 2017-07-24: 08:00:00 via INTRAVENOUS

## 2017-07-24 MED ORDER — KETOROLAC TROMETHAMINE 30 MG/ML IJ SOLN: 30.0000 mg | Freq: Once | INTRAMUSCULAR | Status: DC

## 2017-07-24 MED ORDER — MIDAZOLAM HCL 2 MG/2ML IJ SOLN
1.0000 mg | INTRAMUSCULAR | Status: DC | PRN
  Administered 2017-07-24 (×3): 0.5 mg via INTRAVENOUS

## 2017-07-24 MED ORDER — KETOROLAC TROMETHAMINE 30 MG/ML IJ SOLN
30.0000 mg | Freq: Once | INTRAMUSCULAR | Status: AC
  Administered 2017-07-24: 30 mg via INTRAVENOUS

## 2017-07-24 MED ORDER — SODIUM CHLORIDE 0.9 % IV SOLN: Freq: Once | INTRAVENOUS | Status: DC

## 2017-07-24 MED ORDER — FENTANYL CITRATE (PF) 100 MCG/2ML IJ SOLN
25.0000 ug | INTRAMUSCULAR | Status: DC | PRN
  Administered 2017-07-24 (×2): 50 ug via INTRAVENOUS

## 2017-07-24 NOTE — Discharge Instructions (Signed)
Radiofrequency Ablation Discharge Instructions  1. After your radiofrequency ablation use ice to the affected area for the next 24 hours, as a temporary increase in pain is not uncommon for a day or two after your procedure.  2. Resume all medications.  3. Follow up with your ordering physician for post care.  4. If you have any of the following please call [phone]:      Temperature greater than 101     Pain, redness or swelling at the injection site

## 2017-09-13 ENCOUNTER — Other Ambulatory Visit: Payer: Self-pay | Admitting: Sports Medicine

## 2017-09-13 DIAGNOSIS — G894 Chronic pain syndrome: Secondary | ICD-10-CM

## 2017-10-22 ENCOUNTER — Ambulatory Visit (INDEPENDENT_AMBULATORY_CARE_PROVIDER_SITE_OTHER): Payer: Medicaid Other | Admitting: Sports Medicine

## 2017-10-22 ENCOUNTER — Encounter: Payer: Self-pay | Admitting: Sports Medicine

## 2017-10-22 DIAGNOSIS — G894 Chronic pain syndrome: Secondary | ICD-10-CM | POA: Diagnosis not present

## 2017-10-22 DIAGNOSIS — I10 Essential (primary) hypertension: Secondary | ICD-10-CM | POA: Diagnosis not present

## 2017-10-22 DIAGNOSIS — Z0000 Encounter for general adult medical examination without abnormal findings: Secondary | ICD-10-CM | POA: Insufficient documentation

## 2017-10-22 DIAGNOSIS — M1711 Unilateral primary osteoarthritis, right knee: Secondary | ICD-10-CM | POA: Diagnosis not present

## 2017-10-22 DIAGNOSIS — Z23 Encounter for immunization: Secondary | ICD-10-CM

## 2017-10-22 DIAGNOSIS — M47816 Spondylosis without myelopathy or radiculopathy, lumbar region: Secondary | ICD-10-CM | POA: Diagnosis not present

## 2017-10-22 HISTORY — DX: Essential (primary) hypertension: I10

## 2017-10-22 MED ORDER — RIZATRIPTAN BENZOATE 10 MG PO TABS: 10.0000 mg | ORAL_TABLET | ORAL | 5 refills | Status: DC | PRN

## 2017-10-22 MED ORDER — IBUPROFEN 800 MG PO TABS: 800.0000 mg | ORAL_TABLET | Freq: Three times a day (TID) | ORAL | 2 refills | Status: DC | PRN

## 2017-10-22 MED ORDER — LISINOPRIL-HYDROCHLOROTHIAZIDE 20-25 MG PO TABS: 0.5000 | ORAL_TABLET | Freq: Every day | ORAL | 3 refills | Status: DC

## 2017-10-22 MED ORDER — TRAMADOL HCL 50 MG PO TABS: ORAL_TABLET | ORAL | 0 refills | Status: DC

## 2017-10-22 NOTE — Progress Notes (Signed)
Subjective:    CC: Several issues  HPI: Left knee pain: Moderate osteoarthritis, needs a knee replacement, we tried multiple modalities, pain is now moderate, persistent, desires injection today.  Low back pain: Lumbar facet syndrome, did well with facet joint injections but did not respond to radiofrequency ablation.  No bowel or bladder dysfunction, saddle numbness, constitutional symptoms.  Hypertension: Has been elevated on multiple occasions, agreeable to start medication, no headaches, visual changes, chest pain.  I reviewed the past medical history, family history, social history, surgical history, and allergies today and no changes were needed.  Please see the problem list section below in epic for further details.  Past Medical History: Past Medical History:  Diagnosis Date  . Abnormal pap 1999   colpo  . Anxiety   . Arthritis of both knees 2011  . Chlamydia   . Depression   . Ovarian cyst   . Pneumonia   . Trichomonas    Past Surgical History: Past Surgical History:  Procedure Laterality Date  . ABLATION    . APPENDECTOMY  1996  . KNEE ARTHROSCOPY  1991   Left Kneex2  . Tonsilectomy  1984  . TONSILLECTOMY    . TUBAL LIGATION  1999  . TUBOPLASTY / [place]   Four times between 1980-1990   Social History: Social History   Socioeconomic History  . Marital status: Married    Spouse name: None  . Number of children: None  . Years of education: None  . Highest education level: None  Social Needs  . Financial resource strain: None  . Food insecurity - worry: None  . Food insecurity - inability: None  . Transportation needs - medical: None  . Transportation needs - non-medical: None  Occupational History  . None  Tobacco Use  . Smoking status: Former Smoker    Packs/day: 0.50    Years: 10.00    Pack years: 5.00    Types: Cigarettes    Last attempt to quit: 11/08/2009    Years since quitting: 7.9  . Smokeless tobacco: Never Used    Substance and Sexual Activity  . Alcohol use: No  . Drug use: No  . Sexual activity: Yes    Partners: Male    Birth control/protection: Other-see comments    Comment: Tubal Ligation  Other Topics Concern  . None  Social History Narrative  . None   Family History: Family History  Problem Relation Age of Onset  . Hypertension Maternal Grandfather   . Diabetes Maternal Grandfather   . Stroke Maternal Grandfather   . Hypertension Maternal Grandmother   . Cancer Maternal Grandmother   . Hypertension Paternal Grandfather   . Diabetes Paternal Grandfather   . Hypertension Paternal Grandmother   . Hypertension Mother   . Depression Mother   . Diabetes type II Mother   . Diabetes Mother   . Heart attack Father   . Hypertension Father   . Diabetes type II Father   . Diabetes Father   . Hyperlipidemia Father   . Depression Sister   . Diabetes type II Sister   . Diabetes Sister   . Hyperlipidemia Brother   . Cancer Maternal Aunt   . Alcohol abuse Maternal Uncle   . Alcohol abuse Paternal Uncle    Allergies: Allergies  Allergen Reactions  . Ambien [Zolpidem Tartrate] Other (See Comments)    hallucinations   Medications: See med rec.  Review of Systems: No fevers, chills, night sweats, weight loss, chest  pain, or shortness of breath.   Objective:    General: Well Developed, well nourished, and in no acute distress.  Neuro: Alert and oriented x3, extra-ocular muscles intact, sensation grossly intact.  HEENT: Normocephalic, atraumatic, pupils equal round reactive to light, neck supple, no masses, no lymphadenopathy, thyroid nonpalpable.  Skin: Warm and dry, no rashes. Cardiac: Regular rate and rhythm, no murmurs rubs or gallops, no lower extremity edema.  Respiratory: Clear to auscultation bilaterally. Not using accessory muscles, speaking in full sentences. Left knee: Normal to inspection with no erythema or effusion or obvious bony abnormalities. Tender to palpation at  the medial joint line ROM normal in flexion and extension and lower leg rotation. Ligaments with solid consistent endpoints including ACL, PCL, LCL, MCL. Negative Mcmurray's and provocative meniscal tests. Non painful patellar compression. Patellar and quadriceps tendons unremarkable. Hamstring and quadriceps strength is normal.  Procedure: Real-time Ultrasound Guided Injection of left knee Device: GE Logiq E  Verbal informed consent obtained.  Time-out conducted.  Noted no overlying erythema, induration, or other signs of local infection.  Skin prepped in a sterile fashion.  Local anesthesia: Topical Ethyl chloride.  With sterile technique and under real time ultrasound guidance: 1 cc kenalog 40, 2 cc lidocaine, 2 cc bupivacaine injected easily Completed without difficulty  Pain immediately resolved suggesting accurate placement of the medication.  Advised to call if fevers/chills, erythema, induration, drainage, or persistent bleeding.  Images permanently stored and available for review in the ultrasound unit.  Impression: Technically successful ultrasound guided injection.  Impression and Recommendations:    Osteoarthritis of knee Has failed steroid injections, PT, NSAIDs, [place], arthroscopy x3. Has not been able to get in for knee replacement, left knee injection as above.  Lumbar facet arthropathy ([place]) Did well with bilateral L4-S1 facet injections, did not respond to radiofrequency ablation. Declines proceeding to additional facet injections. Adding tramadol. Return as needed for this.  Annual physical exam Flu shot today.  Benign essential hypertension Adding lisinopril/HCTZ half dose. Return in 2 weeks to recheck blood pressure and for a physical. ___________________________________________ Gwen Her. Dianah Field, M.D., ABFM., CAQSM. Primary Care and [place] Instructor of [place] of [facility]

## 2017-10-22 NOTE — Assessment & Plan Note (Signed)
Flu shot today 

## 2017-10-22 NOTE — Assessment & Plan Note (Signed)
Adding lisinopril/HCTZ half dose. Return in 2 weeks to recheck blood pressure and for a physical.

## 2017-10-22 NOTE — Assessment & Plan Note (Addendum)
Has failed steroid injections, PT, NSAIDs, [place], arthroscopy x3. Has not been able to get in for knee replacement, left knee injection as above.

## 2017-10-22 NOTE — Assessment & Plan Note (Signed)
Did well with bilateral L4-S1 facet injections, did not respond to radiofrequency ablation. Declines proceeding to additional facet injections. Adding tramadol. Return as needed for this.

## 2017-11-06 ENCOUNTER — Encounter: Payer: Self-pay | Admitting: Sports Medicine

## 2017-11-19 ENCOUNTER — Ambulatory Visit (INDEPENDENT_AMBULATORY_CARE_PROVIDER_SITE_OTHER): Payer: Medicaid Other | Admitting: Sports Medicine

## 2017-11-19 ENCOUNTER — Encounter: Payer: Self-pay | Admitting: Sports Medicine

## 2017-11-19 DIAGNOSIS — L989 Disorder of the skin and subcutaneous tissue, unspecified: Secondary | ICD-10-CM | POA: Diagnosis not present

## 2017-11-19 DIAGNOSIS — I10 Essential (primary) hypertension: Secondary | ICD-10-CM | POA: Diagnosis not present

## 2017-11-19 DIAGNOSIS — C439 Malignant melanoma of skin, unspecified: Secondary | ICD-10-CM

## 2017-11-19 DIAGNOSIS — Z0000 Encounter for general adult medical examination without abnormal findings: Secondary | ICD-10-CM | POA: Diagnosis not present

## 2017-11-19 HISTORY — DX: Malignant melanoma of skin, unspecified: C43.9

## 2017-11-19 NOTE — Assessment & Plan Note (Signed)
Routine physical as above, blood work ordered.

## 2017-11-19 NOTE — Assessment & Plan Note (Signed)
Concerning for melanoma, I will bring her back for a full surgical excision with 1 cm margins.

## 2017-11-19 NOTE — Progress Notes (Signed)
Subjective:    CC: Annual physical  HPI:  This is a pleasant 42 year old female, here for physical, no complaints.  I reviewed the past medical history, family history, social history, surgical history, and allergies today and no changes were needed.  Please see the problem list section below in epic for further details.  Past Medical History: Past Medical History:  Diagnosis Date  . Abnormal pap 1999   colpo  . Anxiety   . Arthritis of both knees 2011  . Chlamydia   . Depression   . Ovarian cyst   . Pneumonia   . Trichomonas    Past Surgical History: Past Surgical History:  Procedure Laterality Date  . ABLATION    . APPENDECTOMY  1996  . KNEE ARTHROSCOPY  1991   Left Kneex2  . Tonsilectomy  1984  . TONSILLECTOMY    . TUBAL LIGATION  1999  . TUBOPLASTY / [place]   Four times between 1980-1990   Social History: Social History   Socioeconomic History  . Marital status: Married    Spouse name: None  . Number of children: None  . Years of education: None  . Highest education level: None  Social Needs  . Financial resource strain: None  . Food insecurity - worry: None  . Food insecurity - inability: None  . Transportation needs - medical: None  . Transportation needs - non-medical: None  Occupational History  . None  Tobacco Use  . Smoking status: Former Smoker    Packs/day: 0.50    Years: 10.00    Pack years: 5.00    Types: Cigarettes    Last attempt to quit: 11/08/2009    Years since quitting: 8.0  . Smokeless tobacco: Never Used  Substance and Sexual Activity  . Alcohol use: No  . Drug use: No  . Sexual activity: Yes    Partners: Male    Birth control/protection: Other-see comments    Comment: Tubal Ligation  Other Topics Concern  . None  Social History Narrative  . None   Family History: Family History  Problem Relation Age of Onset  . Hypertension Maternal Grandfather   . Diabetes Maternal Grandfather   . Stroke  Maternal Grandfather   . Hypertension Maternal Grandmother   . Cancer Maternal Grandmother   . Hypertension Paternal Grandfather   . Diabetes Paternal Grandfather   . Hypertension Paternal Grandmother   . Hypertension Mother   . Depression Mother   . Diabetes type II Mother   . Diabetes Mother   . Heart attack Father   . Hypertension Father   . Diabetes type II Father   . Diabetes Father   . Hyperlipidemia Father   . Depression Sister   . Diabetes type II Sister   . Diabetes Sister   . Hyperlipidemia Brother   . Cancer Maternal Aunt   . Alcohol abuse Maternal Uncle   . Alcohol abuse Paternal Uncle    Allergies: Allergies  Allergen Reactions  . Ambien [Zolpidem Tartrate] Other (See Comments)    hallucinations   Medications: See med rec.  Review of Systems: No headache, visual changes, nausea, vomiting, diarrhea, constipation, dizziness, abdominal pain, skin rash, fevers, chills, night sweats, swollen lymph nodes, weight loss, chest pain, body aches, joint swelling, muscle aches, shortness of breath, mood changes, visual or auditory hallucinations.  Objective:    General: Well Developed, well nourished, and in no acute distress.  Neuro: Alert and oriented x3, extra-ocular muscles intact, sensation grossly intact.  Cranial nerves II through XII are intact, motor, sensory, and coordinative functions are all intact. HEENT: Normocephalic, atraumatic, pupils equal round reactive to light, neck supple, no masses, no lymphadenopathy, thyroid nonpalpable. Oropharynx, nasopharynx, external ear canals are unremarkable. Skin: Warm and dry, there is a large, variegated, irregular, hyperpigmented macule on her left upper back, this is highly concerning for a melanoma. Cardiac: Regular rate and rhythm, no murmurs rubs or gallops.  Respiratory: Clear to auscultation bilaterally. Not using accessory muscles, speaking in full sentences.  Abdominal: Soft, nontender, nondistended, positive bowel  sounds, no masses, no organomegaly.  Musculoskeletal: Shoulder, elbow, wrist, hip, knee, ankle stable, and with full range of motion.  Impression and Recommendations:    The patient was counselled, risk factors were discussed, anticipatory guidance given.  Annual physical exam Routine physical as above, blood work ordered.  Benign essential hypertension Controlled, no changes.  Skin lesion Concerning for melanoma, I will bring her back for a full surgical excision with 1 cm margins.  __________________________________________ Gwen Her. Dianah Field, M.D., ABFM., CAQSM. Primary Care and [place] Instructor of [place] of [facility]

## 2017-11-19 NOTE — Assessment & Plan Note (Signed)
Controlled, no changes.

## 2017-11-20 ENCOUNTER — Other Ambulatory Visit: Payer: Self-pay | Admitting: Sports Medicine

## 2017-11-20 DIAGNOSIS — M47816 Spondylosis without myelopathy or radiculopathy, lumbar region: Secondary | ICD-10-CM

## 2017-11-22 ENCOUNTER — Ambulatory Visit (INDEPENDENT_AMBULATORY_CARE_PROVIDER_SITE_OTHER): Payer: Medicaid Other | Admitting: Sports Medicine

## 2017-11-22 ENCOUNTER — Encounter: Payer: Self-pay | Admitting: Sports Medicine

## 2017-11-22 VITALS — BP 112/74 | HR 80 | Ht 64.0 in | Wt 198.0 lb

## 2017-11-22 DIAGNOSIS — L989 Disorder of the skin and subcutaneous tissue, unspecified: Secondary | ICD-10-CM

## 2017-11-22 DIAGNOSIS — D229 Melanocytic nevi, unspecified: Secondary | ICD-10-CM

## 2017-11-22 MED ORDER — OXYCODONE-ACETAMINOPHEN 5-325 MG PO TABS: 1.0000 | ORAL_TABLET | Freq: Three times a day (TID) | ORAL | 0 refills | Status: DC | PRN

## 2017-11-22 NOTE — Patient Instructions (Signed)
Incision Care, Adult An incision is a surgical cut that is made through your skin. Most incisions are closed after surgery. Your incision may be closed with stitches (sutures), staples, skin glue, or adhesive strips. You may need to return to your health care provider to have sutures or staples removed. This may occur several days to several weeks after your surgery. The incision needs to be cared for properly to prevent infection. How to care for your incision Incision care   Follow instructions from your health care provider about how to take care of your incision. Make sure you: ? Wash your hands with soap and water before you change the bandage (dressing). If soap and water are not available, use hand sanitizer. ? Change your dressing as told by your health care provider. ? Leave sutures, skin glue, or adhesive strips in place. These skin closures may need to stay in place for 2 weeks or longer. If adhesive strip edges start to loosen and curl up, you may trim the loose edges. Do not remove adhesive strips completely unless your health care provider tells you to do that.  Check your incision area every day for signs of infection. Check for: ? More redness, swelling, or pain. ? More fluid or blood. ? Warmth. ? Pus or a bad smell.  Ask your health care provider how to clean the incision. This may include: ? Using mild soap and water. ? Using a clean towel to pat the incision dry after cleaning it. ? Applying a cream or ointment. Do this only as told by your health care provider. ? Covering the incision with a clean dressing.  Ask your health care provider when you can leave the incision uncovered.  Do not take baths, swim, or use a hot tub until your health care provider approves. Ask your health care provider if you can take showers. You may only be allowed to take sponge baths for bathing. Medicines  If you were prescribed an antibiotic medicine, cream, or ointment, take or apply the  antibiotic as told by your health care provider. Do not stop taking or applying the antibiotic even if your condition improves.  Take over-the-counter and prescription medicines only as told by your health care provider. General instructions  Limit movement around your incision to improve healing. ? Avoid straining, lifting, or exercise for the first month, or for as long as told by your health care provider. ? Follow instructions from your health care provider about returning to your normal activities. ? Ask your health care provider what activities are safe.  Protect your incision from the sun when you are outside for the first 6 months, or for as long as told by your health care provider. Apply sunscreen around the scar or cover it up.  Keep all follow-up visits as told by your health care provider. This is important. Contact a health care provider if:  Your have more redness, swelling, or pain around the incision.  You have more fluid or blood coming from the incision.  Your incision feels warm to the touch.  You have pus or a bad smell coming from the incision.  You have a fever or shaking chills.  You are nauseous or you vomit.  You are dizzy.  Your sutures or staples come undone. Get help right away if:  You have a red streak coming from your incision.  Your incision bleeds through the dressing and the bleeding does not stop with gentle pressure.  The edges of  your incision open up and separate.  You have severe pain.  You have a rash.  You are confused.  You faint.  You have trouble breathing and a fast heartbeat. This information is not intended to replace advice given to you by your health care provider. Make sure you discuss any questions you have with your health care provider. Document Released: 04/21/2005 Document Revised: 06/09/2016 Document Reviewed: 04/19/2016 Elsevier Interactive Patient Education  [employer].

## 2017-11-22 NOTE — Assessment & Plan Note (Signed)
Large skin lesion removed, wound closure by rhomboid/Limberg flap. Multiple sutures placed. Return next week for a wound check, I do suspect 2 weeks to have the sutures in place. Localizing suture placed at the inferior pole of the excision. Oxycodone 5 mg for pain.

## 2017-11-22 NOTE — Progress Notes (Signed)
   Procedure: Excision of left posterior shoulder likely malignant mass, 5 cm.  Rotational rhomboid flap to close secondary defect, 16 cm. Risks, benefits, and alternatives explained and consent obtained. Time out conducted. Surface prepped with alcohol. 20cc lidocaine with epinephine infiltrated in a field block. Adequate anesthesia ensured. Area prepped and draped in a sterile fashion. I then used a marker to create a rhomboid around the lesion keeping a 2 cm margin.  I removed the lesion in its entirety down to the subcutaneous tissues.  A suture was used at the inferior pole for localization purposes.  I then made a further incision laterally, creating a rhomboid/Limberg flap, the flap was rotated into position and closed with a combination of 0 Prolene and 3-0 simple interrupted Ethilon sutures, at least 20 sutures. Hemostasis achieved. Pt stable.

## 2017-11-27 ENCOUNTER — Ambulatory Visit (INDEPENDENT_AMBULATORY_CARE_PROVIDER_SITE_OTHER): Payer: Medicaid Other | Admitting: Sports Medicine

## 2017-11-27 ENCOUNTER — Encounter: Payer: Self-pay | Admitting: Sports Medicine

## 2017-11-27 DIAGNOSIS — C4359 Malignant melanoma of other part of trunk: Secondary | ICD-10-CM

## 2017-11-27 DIAGNOSIS — L989 Disorder of the skin and subcutaneous tissue, unspecified: Secondary | ICD-10-CM

## 2017-11-27 MED ORDER — OXYCODONE-ACETAMINOPHEN 5-325 MG PO TABS: ORAL_TABLET | ORAL | 0 refills | Status: DC

## 2017-11-27 NOTE — Progress Notes (Signed)
  Subjective: Nearly 1 week post surgical excision of malignant melanoma on the left upper back, with limberg/rhomboid flap rotated into the defect.  Overall doing well, having moderate nighttime pain.  Objective: General: Well-developed, well-nourished, and in no acute distress. Incision: Clean, dry, intact, there is a bit of darkening at the tip of the rhomboid flap.  Sutures are in place, no erythema, induration, drainage.  Assessment/plan:   Malignant melanoma (HCC) Superficial spreading, Breslow depth is 0.8 mm. This appears to be pT1b. Surgical site looks ok, there is a touch of darkening at the tip of the rhomboid flap, we will keep an eye on this. I am leaving the sutures in place, no signs of bacterial superinfection. Adding a bit more Percocet, she is having a lot of pain at night. Considering the depth she will need a sentinel lymph node biopsy, referral to Dr. Barry Dienes for this. I'd like to see her back in a week and we will discuss suture removal at that time. ___________________________________________ Gwen Her. Dianah Field, M.D., ABFM., CAQSM. Primary Care and [place] Instructor of [place] of [facility] of [facility]

## 2017-11-27 NOTE — Assessment & Plan Note (Signed)
Superficial spreading, Breslow depth is 0.8 mm. This appears to be pT1b. Surgical site looks ok, there is a touch of darkening at the tip of the rhomboid flap, we will keep an eye on this. I am leaving the sutures in place, no signs of bacterial superinfection. Adding a bit more Percocet, she is having a lot of pain at night. Considering the depth she will need a sentinel lymph node biopsy, referral to Dr. Barry Dienes for this. I'd like to see her back in a week and we will discuss suture removal at that time.

## 2017-12-04 ENCOUNTER — Other Ambulatory Visit: Payer: Self-pay | Admitting: General Surgery

## 2017-12-04 DIAGNOSIS — C4362 Malignant melanoma of left upper limb, including shoulder: Secondary | ICD-10-CM

## 2017-12-05 ENCOUNTER — Ambulatory Visit: Payer: Self-pay | Admitting: Sports Medicine

## 2017-12-06 ENCOUNTER — Encounter: Payer: Self-pay | Admitting: Sports Medicine

## 2017-12-06 ENCOUNTER — Ambulatory Visit (INDEPENDENT_AMBULATORY_CARE_PROVIDER_SITE_OTHER): Payer: Medicaid Other | Admitting: Sports Medicine

## 2017-12-06 DIAGNOSIS — C4359 Malignant melanoma of other part of trunk: Secondary | ICD-10-CM

## 2017-12-06 MED ORDER — DOXYCYCLINE HYCLATE 100 MG PO TABS: 100.0000 mg | ORAL_TABLET | Freq: Two times a day (BID) | ORAL | 0 refills | Status: AC

## 2017-12-06 NOTE — Progress Notes (Signed)
  Subjective: 2 weeks post melanoma excision with Limberg rotational flap.  Overall doing well, she is going to need a sentinel lymph node biopsy with methylene blue injection, the melanoma Breslow thickness was just at 0.8 mm.  Objective: General: Well-developed, well-nourished, and in no acute distress. Incision is clean, dry, for the most part intact, the very tip of the Limberg rotational flap does show a bit of skin necrosis, but I do think this will close up nicely.  All sutures were removed, I did apply a bit of Dermabond which will probably come off by the time her sentinel lymph node biopsy occurs on Tuesday.  Assessment/plan:   Malignant melanoma ([place]) 2 weeks post surgical excision with Limberg rotational flap.   Doing well, sutures removed, she will need a sentinel lymph node biopsy. Return to see me in a month. ___________________________________________ Gwen Her. Dianah Field, M.D., ABFM., CAQSM. Primary Care and [place] Instructor of [place] of [facility] of Medicine

## 2017-12-06 NOTE — Assessment & Plan Note (Addendum)
2 weeks post surgical excision with Limberg rotational flap.   Doing well, sutures removed, she will need a sentinel lymph node biopsy. Return to see me in a month.

## 2017-12-08 ENCOUNTER — Encounter (HOSPITAL_COMMUNITY): Payer: Self-pay | Admitting: *Deleted

## 2017-12-08 NOTE — Progress Notes (Signed)
Patient denies chest pain or shortness of breath. Denies cardiology visit.

## 2017-12-10 NOTE — H&P (Signed)
Dorise Bullion Documented: 12/04/2017 3:04 PM Location: [facility] Patient #: [id number] DOB: [birth year] Married / Language: Cleophus Molt / Race: White Female   History of Present Illness Stark Klein MD; 12/04/2017 5:57 PM) The patient is a 42 year old female who presents with malignant melanoma. Pt is seen in consultation at the request of Dr. Dorcas Mcmurray for a new diagnosis of melanoma of the left shoulder. Patient is a 42 year old female who presented to her primary care office with a enlarging and changing mole on her left shoulder. Patient states that she has had a mole there for as long as she can remember. However it started becoming darker in the center and larger. Her primary care doctor excised the entire lesion and did a rotational flap to cover this. It came back as a malignant melanoma with a depth of 0.8 mm. This was Clark's level 4 with the closest margin of 4.9 mm. There was no ulceration or satellitosis. There was no LV I or neurotropism. Mitotic index was absent. Tumor infiltrating lymphocytes was nonbrisk. This was noted to be a superficial spreading but appeared to be very aggressive histologically. She denies pain at this time. She is not having any drainage or bleeding. She has not had melanoma in the past. She has had blistering sunburns in the past. She does not work outside the home, but cares for her chickens and dogs outside.  pathology 11/22/2017 MELANOMA TABLE (AJCC 8TH EDITION*) PROCEDURE: EXCISION SPECIMEN ANATOMIC SITE: LEFT POSTERIOR SHOULDER HISTOLOGIC TYPE: SUPERFICIAL SPREADING BRESLOW'S DEPTH/MAXIMUM TUMOR THICKNESS: 0.8 MM CLARK/ANATOMIC LEVEL: IV MARGINS PERIPHERAL MARGINS: FREE: CLOSEST PATHOLOGICAL MARGIN (CLINICAL MARGIN SUPPLANTS PATHOLOGIC MARGIN) 4.9 MM FROM MELANOMA (BLACK INK, 3 O'CLOCK MARGIN, BLOCK IA) DEEP MARGIN: FREE ULCERATION: ABSENT SATELLITOSIS: ABSENT MITOTIC INDEX: <1/MM2 (0) LYMPHO-VASCULAR INVASION:  ABSENT NEUROTROPISM: ABSENT TUMOR-INFILTRATING LYMPHOCYTES: NON-BRISK TUMOR REGRESSION: ABSENT LYMPH NODES (IF APPLICABLE): N/A PATHOLOGIC STAGE: PT1BNX MX COMMENT: AN EXCISION IS PERFORMED, BUT A PT1B PATHOLOGICAL STAGE MELANOMA MAY QUALIFY FOR SLN BIOPSY DEPENDING ON PERFORMANCE STATUS.   Past Surgical History (Tanisha A. Owens Shark, [place]; 12/04/2017 3:04 PM) Appendectomy  Knee Surgery  Left. Oral Surgery  Tonsillectomy   Diagnostic Studies History (Tanisha A. Owens Shark, [place]; 12/04/2017 3:04 PM) Colonoscopy  never Mammogram  never Pap Smear  1-5 years ago  Allergies (Tanisha A. Owens Shark, [place]; 12/04/2017 3:06 PM) Ambien *HYPNOTICS/SEDATIVES/SLEEP DISORDER AGENTS*  Allergies Reconciled   Medication History (Tanisha A. Owens Shark, [place]; 12/04/2017 3:08 PM) Oxycodone-Acetaminophen (5-325MG Tablet, Oral) Active. TraMADol HCl (50MG Tablet, Oral) Active. Ibuprofen (800MG Tablet, Oral) Active. Lisinopril-Hydrochlorothiazide (20-25MG Tablet, Oral) Active. Rizatriptan Benzoate (10MG Tablet, Oral) Active. Tylenol 8 Hour (650MG Tablet ER, Oral) Active. NexIUM (40MG Capsule DR, Oral) Active. Topiramate (100MG Tablet, Oral) Active. Medications Reconciled  Social History (Tanisha A. Owens Shark, [place]; 12/04/2017 3:04 PM) Alcohol use  Occasional alcohol use. Caffeine use  Coffee. Tobacco use  Former smoker.  Family History (Tanisha A. Owens Shark, [place]; 12/04/2017 3:04 PM) Arthritis  Brother, Father, Mother. Cancer  Family Members In General. Depression  Mother, Sister. Diabetes Mellitus  Father, Mother, Sister. Hypertension  Brother, Father. Migraine Headache  Father, Sister. Ovarian Cancer  Family Members In General.  Pregnancy / Birth History (Tanisha A. Owens Shark, [place]; 12/04/2017 3:04 PM) Age at menarche  47 years. Contraceptive History  Oral contraceptives. Gravida  5 Irregular periods  Maternal age  36-20 Para  2  Other Problems (Tanisha A. Owens Shark, [place]; 12/04/2017 3:04  PM) Anxiety Disorder  Arthritis  Back Pain  Depression  High blood pressure  Melanoma  Migraine Headache     Review of Systems (Tanisha A. Brown RMA; 12/04/2017 3:04 PM) General Not Present- Appetite Loss, Chills, Fatigue, Fever, Night Sweats, Weight Gain and Weight Loss. Skin Present- Change in Wart/Mole. Not Present- Dryness, Hives, Jaundice, New Lesions, Non-Healing Wounds, Rash and Ulcer. Respiratory Present- Snoring. Not Present- Bloody sputum, Chronic Cough, Difficulty Breathing and Wheezing. Breast Not Present- Breast Mass, Breast Pain, Nipple Discharge and Skin Changes. Cardiovascular Not Present- Chest Pain, Difficulty Breathing Lying Down, Leg Cramps, Palpitations, Rapid Heart Rate, Shortness of Breath and Swelling of Extremities. Gastrointestinal Present- Excessive gas and Indigestion. Not Present- Abdominal Pain, Bloating, Bloody Stool, Change in Bowel Habits, Chronic diarrhea, Constipation, Difficulty Swallowing, Gets full quickly at meals, Hemorrhoids, Nausea, Rectal Pain and Vomiting. Musculoskeletal Present- Back Pain, Joint Pain, Joint Stiffness, Muscle Pain, Muscle Weakness and Swelling of Extremities. Neurological Present- Headaches. Not Present- Decreased Memory, Fainting, Numbness, Seizures, Tingling, Tremor, Trouble walking and Weakness. Psychiatric Present- Anxiety and Depression. Not Present- Bipolar, Change in Sleep Pattern, Fearful and Frequent crying. Endocrine Not Present- Cold Intolerance, Excessive Hunger, Hair Changes, Heat Intolerance, Hot flashes and New Diabetes. Hematology Not Present- Blood Thinners, Easy Bruising, Excessive bleeding, Gland problems, HIV and Persistent Infections.  Vitals (Tanisha A. Brown RMA; 12/04/2017 3:06 PM) 12/04/2017 3:05 PM Weight: 195.8 lb Height: 64in Body Surface Area: 1.94 m Body Mass Index: 33.61 kg/m  Temp.: 97.15F  Pulse: 58 (Regular)  BP: 118/84 (Sitting, Left Arm, Standard)       Physical Exam  Stark Klein MD; 12/04/2017 5:51 PM) General Mental Status-Alert. General Appearance-Consistent with stated age. Hydration-Well hydrated. Voice-Normal.  Integumentary Note: Rotational flap on the left posterior shoulder is without erythema or drainage. There is still a bit of swelling there. no pigmented area remains.   Head and Neck Head-normocephalic, atraumatic with no lesions or palpable masses.  Eye Sclera/Conjunctiva - Bilateral-No scleral icterus.  Chest and Lung Exam Chest and lung exam reveals -quiet, even and easy respiratory effort with no use of accessory muscles. Inspection Chest Wall - Normal. Back - normal.  Breast - Did not examine.  Cardiovascular Cardiovascular examination reveals -normal pedal pulses bilaterally. Note: regular rate and rhythm  Abdomen Inspection-Inspection Normal. Palpation/Percussion Palpation and Percussion of the abdomen reveal - Soft, Non Tender, No Rebound tenderness, No Rigidity (guarding) and No hepatosplenomegaly.  Peripheral Vascular Upper Extremity Inspection - Bilateral - Normal - No Clubbing, No Cyanosis, No Edema, Pulses Intact. Lower Extremity Palpation - Edema - Bilateral - No edema.  Neurologic Neurologic evaluation reveals -alert and oriented x 3 with no impairment of recent or remote memory. Mental Status-Normal.  Musculoskeletal Global Assessment -Note: no gross deformities.  Normal Exam - Left-Upper Extremity Strength Normal and Lower Extremity Strength Normal. Normal Exam - Right-Upper Extremity Strength Normal and Lower Extremity Strength Normal.  Lymphatic Head & Neck  General Head & Neck Lymphatics: Bilateral - Description - Normal. Axillary  General Axillary Region: Bilateral - Description - Normal. Tenderness - Non Tender.    Assessment & Plan Stark Klein MD; 12/04/2017 5:56 PM) MELANOMA OF SHOULDER, LEFT (C43.62) Impression: Patient has a pT1b malignant melanoma  of the left shoulder. Given the rotational flap seen, I would not plan to excise her entire scar. She had clinically a 1 cm margin and a pathologic 5 mm margin for melanoma. It would be quite a large excision to take out the entire rotational flap. This seems like an adequate margin.  I would however, do a sentinel lymph node biopsy. She is at a  borderline depth for performing a sentinel node biopsy, but given the aggressive histologic nature of the melanoma and her young age, I would recommend staging the lesion. If she had a very poor functional status, was much older, and had significant comorbidities, I would hold off, but her risk of metastasis is high enough that it would warrant adequate staging. If she were to have undiagnosed metastatic disease in the lymph nodes, she would be able to withstand adjuvant treatment and potentially avert recurrent disease.  I discussed surgery. I would give some blue dye in addition to the radioactive tracer. Because melanoma requires intradermal injection of methylene blue, I will plan on excising the loosest part of her scar and injecting the blue dye there. Methylene blue can cause dermal necrosis in 25% of patients, so I would remove the blue portion.  I reviewed the risks of surgery and recovery. she understands and wishes to proceed. Current Plans Pt Education - Melanoma: skin cancer   Signed by Stark Klein, MD (12/04/2017 5:58 PM)

## 2017-12-11 ENCOUNTER — Ambulatory Visit (HOSPITAL_COMMUNITY): Payer: Medicaid Other | Admitting: Certified Registered Nurse Anesthetist

## 2017-12-11 ENCOUNTER — Encounter (HOSPITAL_COMMUNITY)
Admission: RE | Admit: 2017-12-11 | Discharge: 2017-12-11 | Disposition: A | Discharge status: Home / Self Care | Payer: Medicaid Other | Source: Ambulatory Visit | Attending: General Surgery | Admitting: General Surgery

## 2017-12-11 ENCOUNTER — Encounter (HOSPITAL_COMMUNITY)
Admission: RE | Disposition: A | Discharge status: Home / Self Care | Payer: Self-pay | Source: Ambulatory Visit | Attending: General Surgery

## 2017-12-11 ENCOUNTER — Ambulatory Visit (HOSPITAL_COMMUNITY)
Admission: RE | Admit: 2017-12-11 | Discharge: 2017-12-11 | Disposition: A | Discharge status: Home / Self Care | Payer: Medicaid Other | Source: Ambulatory Visit | Attending: General Surgery | Admitting: General Surgery

## 2017-12-11 ENCOUNTER — Encounter (HOSPITAL_COMMUNITY): Payer: Self-pay | Admitting: *Deleted

## 2017-12-11 DIAGNOSIS — C4362 Malignant melanoma of left upper limb, including shoulder: Secondary | ICD-10-CM | POA: Insufficient documentation

## 2017-12-11 DIAGNOSIS — I10 Essential (primary) hypertension: Secondary | ICD-10-CM | POA: Diagnosis not present

## 2017-12-11 DIAGNOSIS — Z8249 Family history of ischemic heart disease and other diseases of the circulatory system: Secondary | ICD-10-CM | POA: Diagnosis not present

## 2017-12-11 DIAGNOSIS — M1990 Unspecified osteoarthritis, unspecified site: Secondary | ICD-10-CM | POA: Diagnosis not present

## 2017-12-11 DIAGNOSIS — Z888 Allergy status to other drugs, medicaments and biological substances status: Secondary | ICD-10-CM | POA: Diagnosis not present

## 2017-12-11 DIAGNOSIS — Z87891 Personal history of nicotine dependence: Secondary | ICD-10-CM | POA: Insufficient documentation

## 2017-12-11 DIAGNOSIS — G43909 Migraine, unspecified, not intractable, without status migrainosus: Secondary | ICD-10-CM | POA: Insufficient documentation

## 2017-12-11 DIAGNOSIS — C4359 Malignant melanoma of other part of trunk: Secondary | ICD-10-CM

## 2017-12-11 DIAGNOSIS — K219 Gastro-esophageal reflux disease without esophagitis: Secondary | ICD-10-CM | POA: Insufficient documentation

## 2017-12-11 DIAGNOSIS — L98499 Non-pressure chronic ulcer of skin of other sites with unspecified severity: Secondary | ICD-10-CM | POA: Diagnosis not present

## 2017-12-11 DIAGNOSIS — L989 Disorder of the skin and subcutaneous tissue, unspecified: Secondary | ICD-10-CM

## 2017-12-11 DIAGNOSIS — Z833 Family history of diabetes mellitus: Secondary | ICD-10-CM | POA: Diagnosis not present

## 2017-12-11 DIAGNOSIS — Z79899 Other long term (current) drug therapy: Secondary | ICD-10-CM | POA: Diagnosis not present

## 2017-12-11 DIAGNOSIS — D360 Benign neoplasm of lymph nodes: Secondary | ICD-10-CM | POA: Diagnosis not present

## 2017-12-11 DIAGNOSIS — F319 Bipolar disorder, unspecified: Secondary | ICD-10-CM | POA: Diagnosis not present

## 2017-12-11 HISTORY — DX: Gastro-esophageal reflux disease without esophagitis: K21.9

## 2017-12-11 HISTORY — DX: Malignant melanoma of skin, unspecified: C43.9

## 2017-12-11 HISTORY — DX: Migraine, unspecified, not intractable, without status migrainosus: G43.909

## 2017-12-11 HISTORY — PX: MELANOMA EXCISION: SHX5266

## 2017-12-11 HISTORY — PX: LYMPH NODE BIOPSY: SHX201

## 2017-12-11 HISTORY — DX: Essential (primary) hypertension: I10

## 2017-12-11 LAB — BASIC METABOLIC PANEL
ANION GAP: 9 (ref 5–15)
BUN: 20 mg/dL (ref 6–20)
CALCIUM: 9 mg/dL (ref 8.9–10.3)
CO2: 22 mmol/L (ref 22–32)
CREATININE: 0.74 mg/dL (ref 0.44–1.00)
Chloride: 108 mmol/L (ref 101–111)
GFR calc Af Amer: >60 mL/min (ref >60)
GFR calc non Af Amer: >60 mL/min (ref >60)
Glucose, Bld: 107 mg/dL — ABNORMAL HIGH (ref 65–99)
Potassium: 3.9 mmol/L (ref 3.5–5.1)
Sodium: 139 mmol/L (ref 135–145)

## 2017-12-11 LAB — CBC
HCT: 41.6 % (ref 36.0–46.0)
HEMOGLOBIN: 13.5 g/dL (ref 12.0–15.0)
MCH: 27.8 pg (ref 26.0–34.0)
MCHC: 32.5 g/dL (ref 30.0–36.0)
MCV: 85.8 fL (ref 78.0–100.0)
Platelets: 249 10*3/uL (ref 150–400)
RBC: 4.85 MIL/uL (ref 3.87–5.11)
RDW: 13.6 % (ref 11.5–15.5)
WBC: 7.1 10*3/uL (ref 4.0–10.5)

## 2017-12-11 LAB — POCT PREGNANCY, URINE: PREG TEST UR: NEGATIVE

## 2017-12-11 SURGERY — LYMPH NODE BIOPSY
Anesthesia: General | Laterality: Left

## 2017-12-11 MED ORDER — CHLORHEXIDINE GLUCONATE CLOTH 2 % EX PADS: 6.0000 | MEDICATED_PAD | Freq: Once | CUTANEOUS | Status: DC

## 2017-12-11 MED ORDER — ONDANSETRON HCL 4 MG/2ML IJ SOLN
INTRAMUSCULAR | Status: AC
  Filled 2017-12-11: qty 2

## 2017-12-11 MED ORDER — FENTANYL CITRATE (PF) 100 MCG/2ML IJ SOLN
25.0000 ug | INTRAMUSCULAR | Status: DC | PRN
  Administered 2017-12-11 (×3): 25 ug via INTRAVENOUS

## 2017-12-11 MED ORDER — FENTANYL CITRATE (PF) 100 MCG/2ML IJ SOLN
50.0000 ug | Freq: Once | INTRAMUSCULAR | Status: AC
  Administered 2017-12-11: 50 ug via INTRAVENOUS

## 2017-12-11 MED ORDER — MIDAZOLAM HCL 2 MG/2ML IJ SOLN
1.0000 mg | Freq: Once | INTRAMUSCULAR | Status: AC
  Administered 2017-12-11: 1 mg via INTRAVENOUS

## 2017-12-11 MED ORDER — PROPOFOL 10 MG/ML IV BOLUS
INTRAVENOUS | Status: AC
  Filled 2017-12-11: qty 20

## 2017-12-11 MED ORDER — BUPIVACAINE-EPINEPHRINE (PF) 0.5% -1:200000 IJ SOLN
INTRAMUSCULAR | Status: DC | PRN
  Administered 2017-12-11: 30 mL

## 2017-12-11 MED ORDER — LIDOCAINE HCL 1 % IJ SOLN
INTRAMUSCULAR | Status: DC | PRN
  Administered 2017-12-11: 6 mL
  Administered 2017-12-11: 5 mL

## 2017-12-11 MED ORDER — GABAPENTIN 300 MG PO CAPS
300.0000 mg | ORAL_CAPSULE | ORAL | Status: AC
  Administered 2017-12-11: 300 mg via ORAL
  Filled 2017-12-11: qty 1

## 2017-12-11 MED ORDER — MIDAZOLAM HCL 2 MG/2ML IJ SOLN
INTRAMUSCULAR | Status: AC
  Filled 2017-12-11: qty 2

## 2017-12-11 MED ORDER — FENTANYL CITRATE (PF) 100 MCG/2ML IJ SOLN
INTRAMUSCULAR | Status: AC
  Filled 2017-12-11: qty 2

## 2017-12-11 MED ORDER — PROMETHAZINE HCL 25 MG/ML IJ SOLN: 6.2500 mg | INTRAMUSCULAR | Status: DC | PRN

## 2017-12-11 MED ORDER — MEPERIDINE HCL 50 MG/ML IJ SOLN: 6.2500 mg | INTRAMUSCULAR | Status: DC | PRN

## 2017-12-11 MED ORDER — DEXAMETHASONE SODIUM PHOSPHATE 10 MG/ML IJ SOLN
INTRAMUSCULAR | Status: DC | PRN
  Administered 2017-12-11: 5 mg via INTRAVENOUS
  Administered 2017-12-11: 10 mg

## 2017-12-11 MED ORDER — FENTANYL CITRATE (PF) 250 MCG/5ML IJ SOLN
INTRAMUSCULAR | Status: AC
  Filled 2017-12-11: qty 5

## 2017-12-11 MED ORDER — OXYCODONE-ACETAMINOPHEN 5-325 MG PO TABS: ORAL_TABLET | ORAL | 0 refills | Status: DC

## 2017-12-11 MED ORDER — LACTATED RINGERS IV SOLN
INTRAVENOUS | Status: DC
  Administered 2017-12-11 (×3): via INTRAVENOUS

## 2017-12-11 MED ORDER — ONDANSETRON HCL 4 MG/2ML IJ SOLN
INTRAMUSCULAR | Status: DC | PRN
  Administered 2017-12-11: 4 mg via INTRAVENOUS

## 2017-12-11 MED ORDER — MIDAZOLAM HCL 5 MG/5ML IJ SOLN
INTRAMUSCULAR | Status: DC | PRN
  Administered 2017-12-11: 2 mg via INTRAVENOUS

## 2017-12-11 MED ORDER — CEFAZOLIN SODIUM-DEXTROSE 2-4 GM/100ML-% IV SOLN
2.0000 g | INTRAVENOUS | Status: AC
  Administered 2017-12-11: 2 g via INTRAVENOUS
  Filled 2017-12-11: qty 100

## 2017-12-11 MED ORDER — BUPIVACAINE-EPINEPHRINE (PF) 0.5% -1:200000 IJ SOLN
INTRAMUSCULAR | Status: AC
  Filled 2017-12-11: qty 30

## 2017-12-11 MED ORDER — LIDOCAINE 2% (20 MG/ML) 5 ML SYRINGE
INTRAMUSCULAR | Status: DC | PRN
  Administered 2017-12-11: 100 mg via INTRAVENOUS

## 2017-12-11 MED ORDER — FENTANYL CITRATE (PF) 100 MCG/2ML IJ SOLN
INTRAMUSCULAR | Status: AC
  Administered 2017-12-11: 50 ug via INTRAVENOUS
  Filled 2017-12-11: qty 2

## 2017-12-11 MED ORDER — ACETAMINOPHEN 500 MG PO TABS
1000.0000 mg | ORAL_TABLET | ORAL | Status: AC
  Administered 2017-12-11: 1000 mg via ORAL
  Filled 2017-12-11: qty 2

## 2017-12-11 MED ORDER — LIDOCAINE HCL (PF) 1 % IJ SOLN
INTRAMUSCULAR | Status: AC
  Filled 2017-12-11: qty 30

## 2017-12-11 MED ORDER — METHYLENE BLUE 0.5 % INJ SOLN
INTRAVENOUS | Status: DC | PRN
  Administered 2017-12-11: 1 mL via SUBMUCOSAL

## 2017-12-11 MED ORDER — PROPOFOL 10 MG/ML IV BOLUS
INTRAVENOUS | Status: DC | PRN
  Administered 2017-12-11: 200 mg via INTRAVENOUS

## 2017-12-11 MED ORDER — METHYLENE BLUE 0.5 % INJ SOLN
INTRAVENOUS | Status: AC
  Filled 2017-12-11: qty 10

## 2017-12-11 MED ORDER — FENTANYL CITRATE (PF) 100 MCG/2ML IJ SOLN
INTRAMUSCULAR | Status: DC | PRN
  Administered 2017-12-11 (×3): 50 ug via INTRAVENOUS

## 2017-12-11 MED ORDER — TECHNETIUM TC 99M SULFUR COLLOID FILTERED
0.5000 | Freq: Once | INTRAVENOUS | Status: AC | PRN
  Administered 2017-12-11: 0.5 via INTRADERMAL

## 2017-12-11 MED ORDER — MIDAZOLAM HCL 2 MG/2ML IJ SOLN
INTRAMUSCULAR | Status: AC
  Administered 2017-12-11: 1 mg via INTRAVENOUS
  Filled 2017-12-11: qty 2

## 2017-12-11 SURGICAL SUPPLY — 49 items
BENZOIN TINCTURE PRP APPL 2/3 (GAUZE/BANDAGES/DRESSINGS) ×2 IMPLANT
CANISTER SUCT 3000ML PPV (MISCELLANEOUS) ×2 IMPLANT
CHLORAPREP W/TINT 10.5 ML (MISCELLANEOUS) ×2 IMPLANT
CHLORAPREP W/TINT 26ML (MISCELLANEOUS) ×2 IMPLANT
CLIP VESOCCLUDE MED 6/CT (CLIP) ×4 IMPLANT
CLIP VESOCCLUDE SM WIDE 6/CT (CLIP) ×2 IMPLANT
CONT SPEC 4OZ CLIKSEAL STRL BL (MISCELLANEOUS) ×4 IMPLANT
COVER MAYO STAND STRL (DRAPES) ×2 IMPLANT
COVER PROBE W GEL 5X96 (DRAPES) ×2 IMPLANT
COVER SURGICAL LIGHT HANDLE (MISCELLANEOUS) ×2 IMPLANT
DERMABOND ADVANCED (GAUZE/BANDAGES/DRESSINGS) ×1
DERMABOND ADVANCED .7 DNX12 (GAUZE/BANDAGES/DRESSINGS) ×1 IMPLANT
DRAPE LAPAROSCOPIC ABDOMINAL (DRAPES) IMPLANT
DRAPE LAPAROTOMY 100X72 PEDS (DRAPES) ×2 IMPLANT
DRAPE UTILITY XL STRL (DRAPES) ×4 IMPLANT
DRSG TEGADERM 4X4.75 (GAUZE/BANDAGES/DRESSINGS) ×2 IMPLANT
ELECT CAUTERY BLADE 6.4 (BLADE) ×2 IMPLANT
ELECT REM PT RETURN 9FT ADLT (ELECTROSURGICAL) ×2
ELECTRODE REM PT RTRN 9FT ADLT (ELECTROSURGICAL) ×1 IMPLANT
GAUZE SPONGE 4X4 12PLY STRL (GAUZE/BANDAGES/DRESSINGS) ×2 IMPLANT
GAUZE SPONGE 4X4 16PLY XRAY LF (GAUZE/BANDAGES/DRESSINGS) ×2 IMPLANT
GLOVE BIO SURGEON STRL SZ 6 (GLOVE) ×2 IMPLANT
GLOVE INDICATOR 6.5 STRL GRN (GLOVE) ×2 IMPLANT
GOWN STRL REUS W/ TWL LRG LVL3 (GOWN DISPOSABLE) ×2 IMPLANT
GOWN STRL REUS W/TWL 2XL LVL3 (GOWN DISPOSABLE) ×2 IMPLANT
GOWN STRL REUS W/TWL LRG LVL3 (GOWN DISPOSABLE) ×2
ILLUMINATOR WAVEGUIDE N/F (MISCELLANEOUS) ×2 IMPLANT
KIT BASIN OR (CUSTOM PROCEDURE TRAY) ×2 IMPLANT
KIT ROOM TURNOVER OR (KITS) ×2 IMPLANT
NEEDLE HYPO 25GX1X1/2 BEV (NEEDLE) ×2 IMPLANT
NS IRRIG 1000ML POUR BTL (IV SOLUTION) ×2 IMPLANT
PACK SURGICAL SETUP 50X90 (CUSTOM PROCEDURE TRAY) ×2 IMPLANT
PAD ARMBOARD 7.5X6 YLW CONV (MISCELLANEOUS) IMPLANT
PENCIL BUTTON HOLSTER BLD 10FT (ELECTRODE) ×2 IMPLANT
SPECIMEN JAR SMALL (MISCELLANEOUS) IMPLANT
STRIP CLOSURE SKIN 1/2X4 (GAUZE/BANDAGES/DRESSINGS) ×2 IMPLANT
SUT ETHILON 2 0 FS 18 (SUTURE) ×4 IMPLANT
SUT MON AB 4-0 PC3 18 (SUTURE) ×2 IMPLANT
SUT SILK 2 0 PERMA HAND 18 BK (SUTURE) IMPLANT
SUT VIC AB 2-0 SH 27 (SUTURE) ×1
SUT VIC AB 2-0 SH 27XBRD (SUTURE) ×1 IMPLANT
SUT VIC AB 3-0 SH 27 (SUTURE) ×1
SUT VIC AB 3-0 SH 27X BRD (SUTURE) ×1 IMPLANT
SYR BULB 3OZ (MISCELLANEOUS) ×2 IMPLANT
SYR CONTROL 10ML LL (SYRINGE) ×4 IMPLANT
TOWEL OR 17X24 6PK STRL BLUE (TOWEL DISPOSABLE) ×2 IMPLANT
TOWEL OR 17X26 10 PK STRL BLUE (TOWEL DISPOSABLE) ×2 IMPLANT
TUBE CONNECTING 12X1/4 (SUCTIONS) ×2 IMPLANT
YANKAUER SUCT BULB TIP NO VENT (SUCTIONS) ×2 IMPLANT

## 2017-12-11 NOTE — Interval H&P Note (Signed)
History and Physical Interval Note:  12/11/2017 12:49 PM  Evelyn Sanchez  has presented today for surgery, with the diagnosis of left shoulder melanoma  The various methods of treatment have been discussed with the patient and family. After consideration of risks, benefits and other options for treatment, the patient has consented to  Procedure(s): SENTINEL LYMPH NODE MAPPING  BIOPSY (Left) EXCISION OF PRIOR SCAR LEFT SHOULDER MELANOMA WITH CLOSURE (Left) as a surgical intervention .  The patient's history has been reviewed, patient examined, no change in status, stable for surgery.  I have reviewed the patient's chart and labs.  Questions were answered to the patient's satisfaction.     Stark Klein

## 2017-12-11 NOTE — Transfer of Care (Signed)
Immediate Anesthesia Transfer of Care Note  Patient: Chenel Wernli  Procedure(s) Performed: SENTINEL LYMPH NODE MAPPING  BIOPSY (Left ) EXCISION OF PRIOR SCAR LEFT SHOULDER MELANOMA WITH CLOSURE (Left )  Patient Location: PACU  Anesthesia Type:General  Level of Consciousness: patient cooperative and responds to stimulation  Airway & Oxygen Therapy: Patient Spontanous Breathing and Patient connected to face mask oxygen  Post-op Assessment: Report given to RN and Post -op Vital signs reviewed and stable  Post vital signs: Reviewed and stable  Last Vitals:  Vitals:   12/11/17 1105 12/11/17 1110  BP: 125/72 128/87  Pulse: 80 98  Resp: 15 (!) 22  Temp:    SpO2: 100% 99%    Last Pain:  Vitals:   12/11/17 1110  TempSrc:   PainSc: 4       Patients Stated Pain Goal: 2 (69/45/03 [id number])  Complications: No apparent anesthesia complications

## 2017-12-11 NOTE — Anesthesia Postprocedure Evaluation (Signed)
Anesthesia Post Note  Patient: Evelyn Sanchez  Procedure(s) Performed: SENTINEL LYMPH NODE MAPPING  BIOPSY (Left ) EXCISION OF PRIOR SCAR LEFT SHOULDER MELANOMA WITH CLOSURE (Left )     Patient location during evaluation: PACU Anesthesia Type: General Level of consciousness: awake and alert Pain management: pain level controlled Vital Signs Assessment: post-procedure vital signs reviewed and stable Respiratory status: spontaneous breathing, nonlabored ventilation, respiratory function stable and patient connected to nasal cannula oxygen Cardiovascular status: blood pressure returned to baseline and stable Postop Assessment: no apparent nausea or vomiting Anesthetic complications: no    Last Vitals:  Vitals:   12/11/17 1542 12/11/17 1600  BP: 104/82 138/83  Pulse: 82 85  Resp: 12 14  Temp: 36.6 C   SpO2: 92% 96%    Last Pain:  Vitals:   12/11/17 1600  TempSrc:   PainSc: Nimrod Brock

## 2017-12-11 NOTE — Anesthesia Procedure Notes (Signed)
Anesthesia Regional Block: Pectoralis block   Pre-Anesthetic Checklist: ,, timeout performed, Correct Patient, Correct Site, Correct Laterality, Correct Procedure, Correct Position, site marked, Risks and benefits discussed,  Surgical consent,  Pre-op evaluation,  At surgeon's request and post-op pain management  Laterality: Left  Prep: chloraprep       Needles:   Needle Type: Stimiplex     Needle Length: 9cm      Additional Needles:   Procedures:,,,, ultrasound used (permanent image in chart),,,,  Narrative:  Start time: 12/11/2017 10:56 AM End time: 12/11/2017 10:59 AM Injection made incrementally with aspirations every 5 mL.  Performed by: Personally  Anesthesiologist: Nolon Nations, MD  Additional Notes: Patient tolerated well. Good fascial spread noted.

## 2017-12-11 NOTE — Anesthesia Preprocedure Evaluation (Signed)
Anesthesia Evaluation  Patient identified by MRN, date of birth, ID band Patient awake    Reviewed: Allergy & Precautions, NPO status , Patient's Chart, lab work & pertinent test results  Airway Mallampati: III  TM Distance: >3 FB Neck ROM: Full    Dental no notable dental hx. (+) Teeth Intact   Pulmonary pneumonia, former smoker,    Pulmonary exam normal breath sounds clear to auscultation       Cardiovascular hypertension, Normal cardiovascular exam Rhythm:Regular Rate:Normal     Neuro/Psych  Headaches, PSYCHIATRIC DISORDERS Anxiety Depression Bipolar Disorder PTSD Neuromuscular disease    GI/Hepatic Neg liver ROS, GERD  ,  Endo/Other  negative endocrine ROS  Renal/GU negative Renal ROS     Musculoskeletal  (+) Arthritis , Osteoarthritis,    Abdominal Normal abdominal exam  (+) + obese,   Peds  Hematology negative hematology ROS (+)   Anesthesia Other Findings   Reproductive/Obstetrics negative OB ROS DUB                             Anesthesia Physical  Anesthesia Plan  ASA: II  Anesthesia Plan: General   Post-op Pain Management:    Induction: Intravenous  PONV Risk Score and Plan: 3 and Ondansetron and Dexamethasone  Airway Management Planned: LMA  Additional Equipment:   Intra-op Plan:   Post-operative Plan: Extubation in OR  Informed Consent: I have reviewed the patients History and Physical, chart, labs and discussed the procedure including the risks, benefits and alternatives for the proposed anesthesia with the patient or authorized representative who has indicated his/her understanding and acceptance.   Dental advisory given  Plan Discussed with: CRNA  Anesthesia Plan Comments:         Anesthesia Quick Evaluation

## 2017-12-11 NOTE — Anesthesia Procedure Notes (Signed)
Procedure Name: LMA Insertion Date/Time: 12/11/2017 1:05 PM Performed by: Verdie Drown, CRNA Pre-anesthesia Checklist: Patient identified, Emergency Drugs available, Suction available, Patient being monitored and Timeout performed Patient Re-evaluated:Patient Re-evaluated prior to induction Oxygen Delivery Method: Circle system utilized Preoxygenation: Pre-oxygenation with 100% oxygen Induction Type: IV induction Ventilation: Oral airway inserted - appropriate to patient size LMA: LMA inserted LMA Size: 4.0 Number of attempts: 1 Comments: Inserted by Burnett Corrente SRNA

## 2017-12-11 NOTE — Op Note (Signed)
PRE-OPERATIVE DIAGNOSIS: cT3bNx left shoulder melanoma  POST-OPERATIVE DIAGNOSIS:  Same  PROCEDURE:  Procedure(s): Left axillary sentinel lymph node mapping and biopsy, wide local excision of melanoma with advancement flap closure 4 x 2 cm  SURGEON:  Surgeon(s): Stark Klein, MD  ASSISTANT: Judyann Munson, RNFA  ANESTHESIA:   local and general  DRAINS: none   LOCAL MEDICATIONS USED:  MARCAINE    and XYLOCAINE   SPECIMEN:  Source of Specimen:  Left axillary sentinel lymph nodes x 3, wide local excision left shoulder melanoma   FINDINGS:  SLN #1 cps 165, SLN #2 cps 50, SLN #3 cps 120.  Nodes firm.    DISPOSITION OF SPECIMEN:  PATHOLOGY  COUNTS:  YES  PLAN OF CARE: Discharge to home after PACU  PATIENT DISPOSITION:  PACU - hemodynamically stable.    PROCEDURE:   Pt was identified in the holding area, taken to the OR, and placed supine on the OR table.  General anesthesia was induced.  Time out was performed according to the surgical safety checklist.  When all was correct, we continued.  One mL methylene blue was injected intradermally around the melanoma excision site.    The patient's left arm and chest were prepped and draped in sterile fashion.  The point of maximum signal intensity was identified with the neoprobe.  A 4 cm incision was made with a #15 blade.  The subcutaneous tissues were divided with the cautery.  A Weitlaner retractor was used to assist with visualization.  The tonsil clamp was used to bluntly dissect the axillary fat pad.  Three deep axillary sentinel lymph nodes were identified as described above.  The lymphovascular channels were clipped with hemoclips.  The nodes were passed off as specimens.  Hemostasis was achieved with the cautery.  The axilla was irrigated and closed with 3-0 Vicryl deep dermal interrupted sutures and 4-0 Monocryl running subcuticular suture.  This was cleaned, dried, and dressed with dermabond.  Counts were correct.  The patient  was placed into the right lateral decubitus position on a beanbag with appropriate padding.  The upper back was prepped and draped in sterile fashion. Local was administered at the prior melanoma biopsy site.  A #10 blade was used to incise the skin around the melanoma.  The cautery was used to take the dissection down to the fascia.  The skin was marked with a 3-0 vicryl.  The cautery was used to take the specimen off the fascia, and it was passed off the table.    Skin hooks were used to elevate the skin edges.  This was pulled together in an oblique orientation.The skin was pulled together and held with penetrating towel clips.  The skin was then reapproximated with 3-0 interrupted vicryl deep dermal sutures and 4-0 monocryl running subcuticular sutures.  Two 2-0 nylon horizontal mattress sutures were placed as well.  The wound was dressed with Benzoin, steristrips, gauze, and tegaderm.    Needle, sponge, and instrument counts were correct.  The patient was awakened from anesthesia and taken to the PACU in stable condition.

## 2017-12-12 ENCOUNTER — Encounter (HOSPITAL_COMMUNITY): Payer: Self-pay | Admitting: General Surgery

## 2017-12-17 ENCOUNTER — Other Ambulatory Visit: Payer: Self-pay | Admitting: Sports Medicine

## 2017-12-17 DIAGNOSIS — C4359 Malignant melanoma of other part of trunk: Secondary | ICD-10-CM

## 2017-12-17 MED ORDER — DOXYCYCLINE HYCLATE 100 MG PO TABS: 100.0000 mg | ORAL_TABLET | Freq: Two times a day (BID) | ORAL | 0 refills | Status: AC

## 2017-12-17 NOTE — Assessment & Plan Note (Signed)
Status post full surgical excision, sentinel lymph node biopsy. She does have some erythema concerning for infection at the site of the sentinel lymph node biopsy, adding a course of doxycycline, further follow-up with her surgeon.

## 2017-12-19 NOTE — Progress Notes (Signed)
Please let patient know lymph nodes are negative for cancer!

## 2017-12-21 ENCOUNTER — Other Ambulatory Visit: Payer: Self-pay | Admitting: Sports Medicine

## 2017-12-21 DIAGNOSIS — M47816 Spondylosis without myelopathy or radiculopathy, lumbar region: Secondary | ICD-10-CM

## 2018-01-03 ENCOUNTER — Ambulatory Visit: Payer: Self-pay | Admitting: Sports Medicine

## 2018-01-06 ENCOUNTER — Other Ambulatory Visit: Payer: Self-pay | Admitting: Sports Medicine

## 2018-01-06 DIAGNOSIS — G894 Chronic pain syndrome: Secondary | ICD-10-CM

## 2018-01-16 ENCOUNTER — Other Ambulatory Visit: Payer: Self-pay | Admitting: General Surgery

## 2018-01-16 DIAGNOSIS — R071 Chest pain on breathing: Secondary | ICD-10-CM

## 2018-01-21 ENCOUNTER — Ambulatory Visit
Admission: RE | Admit: 2018-01-21 | Discharge: 2018-01-21 | Disposition: A | Discharge status: Home / Self Care | Payer: Medicaid Other | Source: Ambulatory Visit | Attending: General Surgery | Admitting: General Surgery

## 2018-01-21 DIAGNOSIS — R071 Chest pain on breathing: Secondary | ICD-10-CM

## 2018-01-21 MED ORDER — IOPAMIDOL (ISOVUE-370) INJECTION 76%
75.0000 mL | Freq: Once | INTRAVENOUS | Status: AC | PRN
  Administered 2018-01-21: 75 mL via INTRAVENOUS

## 2018-01-21 NOTE — Progress Notes (Signed)
Please let patient know no evidence of blood clot or fluid collection on scan.

## 2018-01-27 ENCOUNTER — Other Ambulatory Visit: Payer: Self-pay | Admitting: Sports Medicine

## 2018-01-27 DIAGNOSIS — M47816 Spondylosis without myelopathy or radiculopathy, lumbar region: Secondary | ICD-10-CM

## 2018-02-12 ENCOUNTER — Encounter: Payer: Self-pay | Admitting: Sports Medicine

## 2018-02-12 ENCOUNTER — Ambulatory Visit (INDEPENDENT_AMBULATORY_CARE_PROVIDER_SITE_OTHER): Payer: Medicaid Other | Admitting: Sports Medicine

## 2018-02-12 DIAGNOSIS — M47816 Spondylosis without myelopathy or radiculopathy, lumbar region: Secondary | ICD-10-CM | POA: Diagnosis not present

## 2018-02-12 DIAGNOSIS — M1711 Unilateral primary osteoarthritis, right knee: Secondary | ICD-10-CM | POA: Diagnosis not present

## 2018-02-12 MED ORDER — TRAMADOL HCL 50 MG PO TABS: 50.0000 mg | ORAL_TABLET | Freq: Three times a day (TID) | ORAL | 0 refills | Status: DC | PRN

## 2018-02-12 NOTE — Assessment & Plan Note (Signed)
Left knee injection, previous injection was 3.5 months ago.

## 2018-02-12 NOTE — Assessment & Plan Note (Signed)
Initially did well with bilateral L4-S1 facet joint injections but seemingly got worse after radiofrequency ablation. I am happy to treat this with tramadol but if she starts to need anything stronger than we will refer her to pain management. She uses it 3 times a day, #270 given.

## 2018-02-12 NOTE — Progress Notes (Signed)
Subjective:    CC: Follow-up  HPI: This is a pleasant 42 year old female, she has known knee osteoarthritis, awaiting knee replacement but she is is been told she is too young, has had arthroscopy without success, she has complex meniscal tearing, osteoarthritis.  Pain is moderate, persistent, localized to the medial joint line without radiation.  Desires injection today.  Low back pain: With facet arthritis, responded initially to facet joint injections but radio frequency ablation was not effective.  Tramadol taken 3 times a day has been useful, she has been through several months of physical therapy.  No bowel or bladder dysfunction, saddle numbness, constitutional symptoms.  Melanoma: Status post primary excision, sentinel lymph node dissection was negative.  She does have some lymphedema.  I reviewed the past medical history, family history, social history, surgical history, and allergies today and no changes were needed.  Please see the problem list section below in epic for further details.  Past Medical History: Past Medical History:  Diagnosis Date  . Abnormal pap 1999   colpo  . Anxiety   . Arthritis of both knees 2011  . Chlamydia   . Depression   . GERD (gastroesophageal reflux disease)   . Hypertension   . Melanoma ([place])   . Migraines   . Ovarian cyst   . Pneumonia   . Trichomonas    Past Surgical History: Past Surgical History:  Procedure Laterality Date  . ABLATION    . APPENDECTOMY  1996  . KNEE ARTHROSCOPY  1991   Left Kneex2  . LYMPH NODE BIOPSY Left 12/11/2017   Procedure: SENTINEL LYMPH NODE MAPPING  BIOPSY;  Surgeon: Stark Klein, MD;  Location: [facility];  Service: General;  Laterality: Left;  Marland Kitchen MELANOMA EXCISION Left 12/11/2017   Procedure: EXCISION OF PRIOR SCAR LEFT SHOULDER MELANOMA WITH CLOSURE;  Surgeon: Stark Klein, MD;  Location: [facility];  Service: General;  Laterality: Left;  . Tonsilectomy  1984  . TONSILLECTOMY    . TUBAL LIGATION  1999  .  TUBOPLASTY / [place]   Four times between 1980-1990   Social History: Social History   Socioeconomic History  . Marital status: Married    Spouse name: Not on file  . Number of children: Not on file  . Years of education: Not on file  . Highest education level: Not on file  Occupational History  . Not on file  Social Needs  . Financial resource strain: Not on file  . Food insecurity:    Worry: Not on file    Inability: Not on file  . Transportation needs:    Medical: Not on file    Non-medical: Not on file  Tobacco Use  . Smoking status: Former Smoker    Packs/day: 0.50    Years: 10.00    Pack years: 5.00    Types: Cigarettes    Last attempt to quit: 11/08/2009    Years since quitting: 8.2  . Smokeless tobacco: Never Used  Substance and Sexual Activity  . Alcohol use: No  . Drug use: No  . Sexual activity: Yes    Partners: Male    Birth control/protection: Other-see comments    Comment: Tubal Ligation  Lifestyle  . Physical activity:    Days per week: Not on file    Minutes per session: Not on file  . Stress: Not on file  Relationships  . Social connections:    Talks on phone: Not on file    Gets together: Not  on file    Attends religious service: Not on file    Active member of club or organization: Not on file    Attends meetings of clubs or organizations: Not on file    Relationship status: Not on file  Other Topics Concern  . Not on file  Social History Narrative  . Not on file   Family History: Family History  Problem Relation Age of Onset  . Hypertension Maternal Grandfather   . Diabetes Maternal Grandfather   . Stroke Maternal Grandfather   . Hypertension Maternal Grandmother   . Cancer Maternal Grandmother   . Hypertension Paternal Grandfather   . Diabetes Paternal Grandfather   . Hypertension Paternal Grandmother   . Hypertension Mother   . Depression Mother   . Diabetes type II Mother   . Diabetes Mother   . Heart  attack Father   . Hypertension Father   . Diabetes type II Father   . Diabetes Father   . Hyperlipidemia Father   . Depression Sister   . Diabetes type II Sister   . Diabetes Sister   . Hyperlipidemia Brother   . Cancer Maternal Aunt   . Alcohol abuse Maternal Uncle   . Alcohol abuse Paternal Uncle    Allergies: Allergies  Allergen Reactions  . Ambien [Zolpidem Tartrate] Other (See Comments)    hallucinations   Medications: See med rec.  Review of Systems: No fevers, chills, night sweats, weight loss, chest pain, or shortness of breath.   Objective:    General: Well Developed, well nourished, and in no acute distress.  Neuro: Alert and oriented x3, extra-ocular muscles intact, sensation grossly intact.  HEENT: Normocephalic, atraumatic, pupils equal round reactive to light, neck supple, no masses, no lymphadenopathy, thyroid nonpalpable.  Skin: Warm and dry, no rashes. Cardiac: Regular rate and rhythm, no murmurs rubs or gallops, no lower extremity edema.  Respiratory: Clear to auscultation bilaterally. Not using accessory muscles, speaking in full sentences. Left Knee: Normal to inspection with no erythema or effusion or obvious bony abnormalities. Palpation normal with no warmth or joint line tenderness or patellar tenderness or condyle tenderness. ROM normal in flexion and extension and lower leg rotation. Ligaments with solid consistent endpoints including ACL, PCL, LCL, MCL. Negative Mcmurray's and provocative meniscal tests. Non painful patellar compression. Patellar and quadriceps tendons unremarkable. Hamstring and quadriceps strength is normal.  Procedure: Real-time Ultrasound Guided Injection of left knee Device: GE Logiq E  Verbal informed consent obtained.  Time-out conducted.  Noted no overlying erythema, induration, or other signs of local infection.  Skin prepped in a sterile fashion.  Local anesthesia: Topical Ethyl chloride.  With sterile technique and  under real time ultrasound guidance: 1 cc kenalog 40, 2 cc lidocaine, 2 cc bupivacaine injected easily Completed without difficulty  Pain immediately resolved suggesting accurate placement of the medication.  Advised to call if fevers/chills, erythema, induration, drainage, or persistent bleeding.  Images permanently stored and available for review in the ultrasound unit.  Impression: Technically successful ultrasound guided injection.  Impression and Recommendations:    Osteoarthritis of knee Left knee injection, previous injection was 3.5 months ago.  Lumbar facet arthropathy ([place]) Initially did well with bilateral L4-S1 facet joint injections but seemingly got worse after radiofrequency ablation. I am happy to treat this with tramadol but if she starts to need anything stronger than we will refer her to pain management. She uses it 3 times a day, #270 given. ___________________________________________ Gwen Her. Dianah Field, M.D., ABFM., CAQSM.  Primary Care and [place] Instructor of [place] of [facility]

## 2018-02-20 ENCOUNTER — Encounter: Payer: Self-pay | Admitting: Sports Medicine

## 2018-04-02 ENCOUNTER — Other Ambulatory Visit: Payer: Self-pay | Admitting: Sports Medicine

## 2018-04-02 DIAGNOSIS — G894 Chronic pain syndrome: Secondary | ICD-10-CM

## 2018-05-02 ENCOUNTER — Other Ambulatory Visit: Payer: Self-pay | Admitting: General Surgery

## 2018-06-05 ENCOUNTER — Other Ambulatory Visit: Payer: Self-pay | Admitting: Sports Medicine

## 2018-06-05 DIAGNOSIS — M47816 Spondylosis without myelopathy or radiculopathy, lumbar region: Secondary | ICD-10-CM

## 2018-07-05 ENCOUNTER — Ambulatory Visit (INDEPENDENT_AMBULATORY_CARE_PROVIDER_SITE_OTHER): Payer: Medicare HMO | Admitting: Sports Medicine

## 2018-07-05 ENCOUNTER — Encounter: Payer: Self-pay | Admitting: Sports Medicine

## 2018-07-05 DIAGNOSIS — M67441 Ganglion, right hand: Secondary | ICD-10-CM | POA: Diagnosis not present

## 2018-07-05 DIAGNOSIS — C4359 Malignant melanoma of other part of trunk: Secondary | ICD-10-CM | POA: Diagnosis not present

## 2018-07-05 DIAGNOSIS — F331 Major depressive disorder, recurrent, moderate: Secondary | ICD-10-CM

## 2018-07-05 DIAGNOSIS — M47816 Spondylosis without myelopathy or radiculopathy, lumbar region: Secondary | ICD-10-CM | POA: Diagnosis not present

## 2018-07-05 DIAGNOSIS — M4807 Spinal stenosis, lumbosacral region: Secondary | ICD-10-CM

## 2018-07-05 DIAGNOSIS — R69 Illness, unspecified: Secondary | ICD-10-CM | POA: Diagnosis not present

## 2018-07-05 HISTORY — DX: Ganglion, right hand: M67.441

## 2018-07-05 MED ORDER — DULOXETINE HCL 30 MG PO CPEP: 30.0000 mg | ORAL_CAPSULE | Freq: Every day | ORAL | 3 refills | Status: DC

## 2018-07-05 NOTE — Assessment & Plan Note (Signed)
Worsening depression without suicidal ideation. Adding Cymbalta 30, return monthly for PHQ 9 and GAD 7. This will also significantly help the myofascial component of her back pain.

## 2018-07-05 NOTE — Assessment & Plan Note (Signed)
History of melanoma, she has had a full surgical excision, with sentinel lymph node biopsy, all negative. She does need a dermatology referral.

## 2018-07-05 NOTE — Progress Notes (Signed)
Subjective:    CC: Back pain, and several other issues  HPI: Low back pain: Evelyn Sanchez has a long history of multifactorial low back pain, she has had several epidurals, facet joint injections, we then proceeded with a bilateral L4-S1 facet radio frequency ablation, she responded well to the facet injections in the medial branch blocks but seem to have worsening pain after her RFA.  Pain is widespread, axial, and the low back, thighs, hips and buttocks.  In addition she is reporting increased depressive mood, no suicidal or homicidal ideation.  History of malignant melanoma: Post full excision, with sentinel lymph node biopsies that were negative.  Does need a dermatologist for additional aftercare.  Finger pain: Right-sided, localized over the volar fourth MCP.  Moderate, persistent without radiation.  I reviewed the past medical history, family history, social history, surgical history, and allergies today and no changes were needed.  Please see the problem list section below in epic for further details.  Past Medical History: Past Medical History:  Diagnosis Date  . Abnormal pap 1999   colpo  . Anxiety   . Arthritis of both knees 2011  . Chlamydia   . Depression   . GERD (gastroesophageal reflux disease)   . Hypertension   . Melanoma ([place])   . Migraines   . Ovarian cyst   . Pneumonia   . Trichomonas    Past Surgical History: Past Surgical History:  Procedure Laterality Date  . ABLATION    . APPENDECTOMY  1996  . KNEE ARTHROSCOPY  1991   Left Kneex2  . LYMPH NODE BIOPSY Left 12/11/2017   Procedure: SENTINEL LYMPH NODE MAPPING  BIOPSY;  Surgeon: Stark Klein, MD;  Location: [facility];  Service: General;  Laterality: Left;  Marland Kitchen MELANOMA EXCISION Left 12/11/2017   Procedure: EXCISION OF PRIOR SCAR LEFT SHOULDER MELANOMA WITH CLOSURE;  Surgeon: Stark Klein, MD;  Location: [facility];  Service: General;  Laterality: Left;  . Tonsilectomy  1984  . TONSILLECTOMY    . TUBAL LIGATION  1999    . TUBOPLASTY / [place]   Four times between 1980-1990   Social History: Social History   Socioeconomic History  . Marital status: Married    Spouse name: Not on file  . Number of children: Not on file  . Years of education: Not on file  . Highest education level: Not on file  Occupational History  . Not on file  Social Needs  . Financial resource strain: Not on file  . Food insecurity:    Worry: Not on file    Inability: Not on file  . Transportation needs:    Medical: Not on file    Non-medical: Not on file  Tobacco Use  . Smoking status: Former Smoker    Packs/day: 0.50    Years: 10.00    Pack years: 5.00    Types: Cigarettes    Last attempt to quit: 11/08/2009    Years since quitting: 8.6  . Smokeless tobacco: Never Used  Substance and Sexual Activity  . Alcohol use: No  . Drug use: No  . Sexual activity: Yes    Partners: Male    Birth control/protection: Other-see comments    Comment: Tubal Ligation  Lifestyle  . Physical activity:    Days per week: Not on file    Minutes per session: Not on file  . Stress: Not on file  Relationships  . Social connections:    Talks on phone: Not on file  Gets together: Not on file    Attends religious service: Not on file    Active member of club or organization: Not on file    Attends meetings of clubs or organizations: Not on file    Relationship status: Not on file  Other Topics Concern  . Not on file  Social History Narrative  . Not on file   Family History: Family History  Problem Relation Age of Onset  . Hypertension Maternal Grandfather   . Diabetes Maternal Grandfather   . Stroke Maternal Grandfather   . Hypertension Maternal Grandmother   . Cancer Maternal Grandmother   . Hypertension Paternal Grandfather   . Diabetes Paternal Grandfather   . Hypertension Paternal Grandmother   . Hypertension Mother   . Depression Mother   . Diabetes type II Mother   . Diabetes Mother   . Heart  attack Father   . Hypertension Father   . Diabetes type II Father   . Diabetes Father   . Hyperlipidemia Father   . Depression Sister   . Diabetes type II Sister   . Diabetes Sister   . Hyperlipidemia Brother   . Cancer Maternal Aunt   . Alcohol abuse Maternal Uncle   . Alcohol abuse Paternal Uncle    Allergies: Allergies  Allergen Reactions  . Ambien [Zolpidem Tartrate] Other (See Comments)    hallucinations   Medications: See med rec.  Review of Systems: No fevers, chills, night sweats, weight loss, chest pain, or shortness of breath.   Objective:    General: Well Developed, well nourished, and in no acute distress.  Neuro: Alert and oriented x3, extra-ocular muscles intact, sensation grossly intact.  HEENT: Normocephalic, atraumatic, pupils equal round reactive to light, neck supple, no masses, no lymphadenopathy, thyroid nonpalpable.  Skin: Warm and dry, no rashes. Cardiac: Regular rate and rhythm, no murmurs rubs or gallops, no lower extremity edema.  Respiratory: Clear to auscultation bilaterally. Not using accessory muscles, speaking in full sentences. Right hand: Palpable nodule with tenderness over the volar fourth MCP consistent with a ganglion cyst.  Impression and Recommendations:    Lumbar facet arthropathy (HCC) Persistent axial low back pain with radiation to the hips and thighs. She initially did well with bilateral L4-S1 facet joint injections but seemingly got worse after L4-S1 facet radio frequency ablation. I am to get an updated MRI, I do think some of her pain is myofascial. We have been doing tramadol for use up to 3 times per day. This is not sufficiently efficacious, she does have concurrent depression, so adding Cymbalta. I would like another opinion from Dr. Francesco Runner as well.  Major depressive disorder, recurrent episode, moderate (HCC) Worsening depression without suicidal ideation. Adding Cymbalta 30, return monthly for PHQ 9 and GAD 7. This  will also significantly help the myofascial component of her back pain.  Ganglion cyst of flexor tendon sheath of finger, right Right ring finger, minimally tender, she would like to treat this conservatively for a couple of weeks before considering injection.  Malignant melanoma ([place]) History of melanoma, she has had a full surgical excision, with sentinel lymph node biopsy, all negative. She does need a dermatology referral. ___________________________________________ Gwen Her. Dianah Field, M.D., ABFM., CAQSM. Primary Care and [place] Instructor of [place] of [facility]

## 2018-07-05 NOTE — Assessment & Plan Note (Signed)
Right ring finger, minimally tender, she would like to treat this conservatively for a couple of weeks before considering injection.

## 2018-07-05 NOTE — Assessment & Plan Note (Signed)
Persistent axial low back pain with radiation to the hips and thighs. She initially did well with bilateral L4-S1 facet joint injections but seemingly got worse after L4-S1 facet radio frequency ablation. I am to get an updated MRI, I do think some of her pain is myofascial. We have been doing tramadol for use up to 3 times per day. This is not sufficiently efficacious, she does have concurrent depression, so adding Cymbalta. I would like another opinion from Dr. Francesco Runner as well.

## 2018-07-10 ENCOUNTER — Telehealth: Payer: Self-pay | Admitting: Sports Medicine

## 2018-07-10 MED ORDER — DIAZEPAM 5 MG PO TABS: ORAL_TABLET | ORAL | 0 refills | Status: DC

## 2018-07-10 NOTE — Telephone Encounter (Signed)
Pt requesting pre meds before MRI. Routing.

## 2018-07-10 NOTE — Telephone Encounter (Signed)
Pt advised.

## 2018-07-10 NOTE — Telephone Encounter (Signed)
Done! ___________________________________________ Gwen Her. Dianah Field, M.D., ABFM., CAQSM. Primary Care and [place] Instructor of [place] of [facility]

## 2018-07-22 ENCOUNTER — Ambulatory Visit (INDEPENDENT_AMBULATORY_CARE_PROVIDER_SITE_OTHER): Payer: Medicare HMO

## 2018-07-22 DIAGNOSIS — M5116 Intervertebral disc disorders with radiculopathy, lumbar region: Secondary | ICD-10-CM

## 2018-07-22 DIAGNOSIS — M4807 Spinal stenosis, lumbosacral region: Secondary | ICD-10-CM | POA: Diagnosis not present

## 2018-07-22 DIAGNOSIS — M545 Low back pain: Secondary | ICD-10-CM | POA: Diagnosis not present

## 2018-07-22 DIAGNOSIS — M47816 Spondylosis without myelopathy or radiculopathy, lumbar region: Secondary | ICD-10-CM

## 2018-07-26 DIAGNOSIS — D2262 Melanocytic nevi of left upper limb, including shoulder: Secondary | ICD-10-CM | POA: Diagnosis not present

## 2018-07-26 DIAGNOSIS — Z08 Encounter for follow-up examination after completed treatment for malignant neoplasm: Secondary | ICD-10-CM | POA: Diagnosis not present

## 2018-07-26 DIAGNOSIS — D239 Other benign neoplasm of skin, unspecified: Secondary | ICD-10-CM | POA: Diagnosis not present

## 2018-07-26 DIAGNOSIS — D2261 Melanocytic nevi of right upper limb, including shoulder: Secondary | ICD-10-CM | POA: Diagnosis not present

## 2018-07-26 DIAGNOSIS — Z85820 Personal history of malignant melanoma of skin: Secondary | ICD-10-CM | POA: Diagnosis not present

## 2018-07-26 DIAGNOSIS — D225 Melanocytic nevi of trunk: Secondary | ICD-10-CM | POA: Diagnosis not present

## 2018-07-29 ENCOUNTER — Other Ambulatory Visit: Payer: Self-pay | Admitting: Sports Medicine

## 2018-07-29 DIAGNOSIS — G894 Chronic pain syndrome: Secondary | ICD-10-CM

## 2018-07-30 ENCOUNTER — Other Ambulatory Visit: Payer: Self-pay | Admitting: Sports Medicine

## 2018-07-30 DIAGNOSIS — I10 Essential (primary) hypertension: Secondary | ICD-10-CM

## 2018-08-02 ENCOUNTER — Ambulatory Visit (INDEPENDENT_AMBULATORY_CARE_PROVIDER_SITE_OTHER): Payer: Medicare HMO | Admitting: Sports Medicine

## 2018-08-02 ENCOUNTER — Encounter: Payer: Self-pay | Admitting: Sports Medicine

## 2018-08-02 DIAGNOSIS — F331 Major depressive disorder, recurrent, moderate: Secondary | ICD-10-CM | POA: Diagnosis not present

## 2018-08-02 DIAGNOSIS — M67441 Ganglion, right hand: Secondary | ICD-10-CM

## 2018-08-02 DIAGNOSIS — R69 Illness, unspecified: Secondary | ICD-10-CM | POA: Diagnosis not present

## 2018-08-02 DIAGNOSIS — G43909 Migraine, unspecified, not intractable, without status migrainosus: Secondary | ICD-10-CM | POA: Diagnosis not present

## 2018-08-02 MED ORDER — DULOXETINE HCL 60 MG PO CPEP: 60.0000 mg | ORAL_CAPSULE | Freq: Every day | ORAL | 3 refills | Status: DC

## 2018-08-02 MED ORDER — TOPIRAMATE ER 50 MG PO CAP24: 1.0000 | ORAL_CAPSULE | Freq: Every day | ORAL | 11 refills | Status: DC

## 2018-08-02 NOTE — Assessment & Plan Note (Signed)
Improvement in symptoms. Increasing Cymbalta to 60. Return in 1 month for PHQ

## 2018-08-02 NOTE — Assessment & Plan Note (Signed)
Worsening of headaches but did self discontinue her Trokendi. Restarting but at 50 mg.

## 2018-08-02 NOTE — Assessment & Plan Note (Signed)
Right fourth flexor tendon sheath injection as above.

## 2018-08-02 NOTE — Progress Notes (Signed)
Subjective:    CC: Follow-up  HPI: Depression: Minimal improvement with Cymbalta 30, no suicidal or homicidal ideation.  Right hand pain: Flexor tendon synovitis, ring finger.  Conservative measures did not provide relief, she desires interventional treatment today.  Headaches: Self discontinued her Trokendi.  Now with worsening of headaches not surprisingly.  I reviewed the past medical history, family history, social history, surgical history, and allergies today and no changes were needed.  Please see the problem list section below in epic for further details.  Past Medical History: Past Medical History:  Diagnosis Date  . Abnormal pap 1999   colpo  . Anxiety   . Arthritis of both knees 2011  . Chlamydia   . Depression   . GERD (gastroesophageal reflux disease)   . Hypertension   . Melanoma ([place])   . Migraines   . Ovarian cyst   . Pneumonia   . Trichomonas    Past Surgical History: Past Surgical History:  Procedure Laterality Date  . ABLATION    . APPENDECTOMY  1996  . KNEE ARTHROSCOPY  1991   Left Kneex2  . LYMPH NODE BIOPSY Left 12/11/2017   Procedure: SENTINEL LYMPH NODE MAPPING  BIOPSY;  Surgeon: Stark Klein, MD;  Location: [place];  Service: General;  Laterality: Left;  Marland Kitchen MELANOMA EXCISION Left 12/11/2017   Procedure: EXCISION OF PRIOR SCAR LEFT SHOULDER MELANOMA WITH CLOSURE;  Surgeon: Stark Klein, MD;  Location: [facility];  Service: General;  Laterality: Left;  . Tonsilectomy  1984  . TONSILLECTOMY    . TUBAL LIGATION  1999  . TUBOPLASTY / [place]   Four times between 1980-1990   Social History: Social History   Socioeconomic History  . Marital status: Married    Spouse name: Not on file  . Number of children: Not on file  . Years of education: Not on file  . Highest education level: Not on file  Occupational History  . Not on file  Social Needs  . Financial resource strain: Not on file  . Food insecurity:    Worry: Not on file      Inability: Not on file  . Transportation needs:    Medical: Not on file    Non-medical: Not on file  Tobacco Use  . Smoking status: Former Smoker    Packs/day: 0.50    Years: 10.00    Pack years: 5.00    Types: Cigarettes    Last attempt to quit: 11/08/2009    Years since quitting: 8.7  . Smokeless tobacco: Never Used  Substance and Sexual Activity  . Alcohol use: No  . Drug use: No  . Sexual activity: Yes    Partners: Male    Birth control/protection: Other-see comments    Comment: Tubal Ligation  Lifestyle  . Physical activity:    Days per week: Not on file    Minutes per session: Not on file  . Stress: Not on file  Relationships  . Social connections:    Talks on phone: Not on file    Gets together: Not on file    Attends religious service: Not on file    Active member of club or organization: Not on file    Attends meetings of clubs or organizations: Not on file    Relationship status: Not on file  Other Topics Concern  . Not on file  Social History Narrative  . Not on file   Family History: Family History  Problem Relation Age of  Onset  . Hypertension Maternal Grandfather   . Diabetes Maternal Grandfather   . Stroke Maternal Grandfather   . Hypertension Maternal Grandmother   . Cancer Maternal Grandmother   . Hypertension Paternal Grandfather   . Diabetes Paternal Grandfather   . Hypertension Paternal Grandmother   . Hypertension Mother   . Depression Mother   . Diabetes type II Mother   . Diabetes Mother   . Heart attack Father   . Hypertension Father   . Diabetes type II Father   . Diabetes Father   . Hyperlipidemia Father   . Depression Sister   . Diabetes type II Sister   . Diabetes Sister   . Hyperlipidemia Brother   . Cancer Maternal Aunt   . Alcohol abuse Maternal Uncle   . Alcohol abuse Paternal Uncle    Allergies: Allergies  Allergen Reactions  . Ambien [Zolpidem Tartrate] Other (See Comments)    hallucinations   Medications:  See med rec.  Review of Systems: No fevers, chills, night sweats, weight loss, chest pain, or shortness of breath.   Objective:    General: Well Developed, well nourished, and in no acute distress.  Neuro: Alert and oriented x3, extra-ocular muscles intact, sensation grossly intact.  HEENT: Normocephalic, atraumatic, pupils equal round reactive to light, neck supple, no masses, no lymphadenopathy, thyroid nonpalpable.  Skin: Warm and dry, no rashes. Cardiac: Regular rate and rhythm, no murmurs rubs or gallops, no lower extremity edema.  Respiratory: Clear to auscultation bilaterally. Not using accessory muscles, speaking in full sentences.  Procedure: Real-time Ultrasound Guided Injection of right fourth flexor tendon sheath Device: GE Logiq E  Verbal informed consent obtained.  Time-out conducted.  Noted no overlying erythema, induration, or other signs of local infection.  Skin prepped in a sterile fashion.  Local anesthesia: Topical Ethyl chloride.  With sterile technique and under real time ultrasound guidance: 1/2 cc kenalog 40, 1/2 cc lidocaine injected easily Completed without difficulty  Pain immediately resolved suggesting accurate placement of the medication.  Advised to call if fevers/chills, erythema, induration, drainage, or persistent bleeding.  Images permanently stored and available for review in the ultrasound unit.  Impression: Technically successful ultrasound guided injection.  Impression and Recommendations:    Major depressive disorder, recurrent episode, moderate (HCC) Improvement in symptoms. Increasing Cymbalta to 60. Return in 1 month for PHQ  Migraine without status migrainosus, not intractable Worsening of headaches but did self discontinue her Trokendi. Restarting but at 50 mg.   Ganglion cyst of flexor tendon sheath of finger, right Right fourth flexor tendon sheath injection as above. ___________________________________________ Gwen Her.  Dianah Field, M.D., ABFM., CAQSM. Primary Care and Sports Medicine [facility]  Adjunct Professor of [place] of [facility] of Medicine

## 2018-08-29 DIAGNOSIS — M5136 Other intervertebral disc degeneration, lumbar region: Secondary | ICD-10-CM | POA: Diagnosis not present

## 2018-08-29 DIAGNOSIS — M545 Low back pain: Secondary | ICD-10-CM | POA: Diagnosis not present

## 2018-08-29 DIAGNOSIS — M47816 Spondylosis without myelopathy or radiculopathy, lumbar region: Secondary | ICD-10-CM | POA: Diagnosis not present

## 2018-08-29 DIAGNOSIS — Z79891 Long term (current) use of opiate analgesic: Secondary | ICD-10-CM | POA: Diagnosis not present

## 2018-08-29 DIAGNOSIS — M222X1 Patellofemoral disorders, right knee: Secondary | ICD-10-CM | POA: Diagnosis not present

## 2018-08-29 DIAGNOSIS — M222X2 Patellofemoral disorders, left knee: Secondary | ICD-10-CM | POA: Diagnosis not present

## 2018-08-29 DIAGNOSIS — G894 Chronic pain syndrome: Secondary | ICD-10-CM | POA: Diagnosis not present

## 2018-08-30 ENCOUNTER — Ambulatory Visit: Payer: Medicare HMO | Admitting: Sports Medicine

## 2018-08-31 DIAGNOSIS — R69 Illness, unspecified: Secondary | ICD-10-CM | POA: Diagnosis not present

## 2018-09-02 ENCOUNTER — Encounter: Payer: Self-pay | Admitting: Sports Medicine

## 2018-09-02 ENCOUNTER — Ambulatory Visit (INDEPENDENT_AMBULATORY_CARE_PROVIDER_SITE_OTHER): Payer: Medicare HMO | Admitting: Sports Medicine

## 2018-09-02 DIAGNOSIS — M1712 Unilateral primary osteoarthritis, left knee: Secondary | ICD-10-CM

## 2018-09-02 DIAGNOSIS — F331 Major depressive disorder, recurrent, moderate: Secondary | ICD-10-CM | POA: Diagnosis not present

## 2018-09-02 DIAGNOSIS — G43909 Migraine, unspecified, not intractable, without status migrainosus: Secondary | ICD-10-CM

## 2018-09-02 DIAGNOSIS — R69 Illness, unspecified: Secondary | ICD-10-CM | POA: Diagnosis not present

## 2018-09-02 MED ORDER — TOPIRAMATE 50 MG PO TABS: ORAL_TABLET | ORAL | 3 refills | Status: DC

## 2018-09-02 MED ORDER — DULOXETINE HCL 60 MG PO CPEP: 120.0000 mg | ORAL_CAPSULE | Freq: Every day | ORAL | 3 refills | Status: DC

## 2018-09-02 NOTE — Assessment & Plan Note (Signed)
Has not had any headaches on Trokendi, they gave her a one-month trial. Because she has Medicare she cannot continue, so doing an up taper of generic Topamax.

## 2018-09-02 NOTE — Progress Notes (Signed)
Subjective:    CC: Multiple issues  HPI: Migraine headaches: She was given a one-month sample of Trokendi, did not have a single migraine all month.  Unfortunately needs to switch to extended release topiramate.  Depression: Improved considerably with the increase to 60 of Cymbalta.  Left knee osteoarthritis: Previous injection was at the end of April, having a recurrence of pain, moderate, persistent, localized to the medial joint line without radiation.  I reviewed the past medical history, family history, social history, surgical history, and allergies today and no changes were needed.  Please see the problem list section below in epic for further details.  Past Medical History: Past Medical History:  Diagnosis Date  . Abnormal pap 1999   colpo  . Anxiety   . Arthritis of both knees 2011  . Chlamydia   . Depression   . GERD (gastroesophageal reflux disease)   . Hypertension   . Melanoma ([place])   . Migraines   . Ovarian cyst   . Pneumonia   . Trichomonas    Past Surgical History: Past Surgical History:  Procedure Laterality Date  . ABLATION    . APPENDECTOMY  1996  . KNEE ARTHROSCOPY  1991   Left Kneex2  . LYMPH NODE BIOPSY Left 12/11/2017   Procedure: SENTINEL LYMPH NODE MAPPING  BIOPSY;  Surgeon: Stark Klein, MD;  Location: [place] [facility];  Service: General;  Laterality: Left;  Marland Kitchen MELANOMA EXCISION Left 12/11/2017   Procedure: EXCISION OF PRIOR SCAR LEFT SHOULDER MELANOMA WITH CLOSURE;  Surgeon: Stark Klein, MD;  Location: [place];  Service: General;  Laterality: Left;  . Tonsilectomy  1984  . TONSILLECTOMY    . TUBAL LIGATION  1999  . TUBOPLASTY / [place]   Four times between 1980-1990   Social History: Social History   Socioeconomic History  . Marital status: Married    Spouse name: Not on file  . Number of children: Not on file  . Years of education: Not on file  . Highest education level: Not on file  Occupational History  . Not on file    Social Needs  . Financial resource strain: Not on file  . Food insecurity:    Worry: Not on file    Inability: Not on file  . Transportation needs:    Medical: Not on file    Non-medical: Not on file  Tobacco Use  . Smoking status: Former Smoker    Packs/day: 0.50    Years: 10.00    Pack years: 5.00    Types: Cigarettes    Last attempt to quit: 11/08/2009    Years since quitting: 8.8  . Smokeless tobacco: Never Used  Substance and Sexual Activity  . Alcohol use: No  . Drug use: No  . Sexual activity: Yes    Partners: Male    Birth control/protection: Other-see comments    Comment: Tubal Ligation  Lifestyle  . Physical activity:    Days per week: Not on file    Minutes per session: Not on file  . Stress: Not on file  Relationships  . Social connections:    Talks on phone: Not on file    Gets together: Not on file    Attends religious service: Not on file    Active member of club or organization: Not on file    Attends meetings of clubs or organizations: Not on file    Relationship status: Not on file  Other Topics Concern  . Not on file  Social History Narrative  . Not on file   Family History: Family History  Problem Relation Age of Onset  . Hypertension Maternal Grandfather   . Diabetes Maternal Grandfather   . Stroke Maternal Grandfather   . Hypertension Maternal Grandmother   . Cancer Maternal Grandmother   . Hypertension Paternal Grandfather   . Diabetes Paternal Grandfather   . Hypertension Paternal Grandmother   . Hypertension Mother   . Depression Mother   . Diabetes type II Mother   . Diabetes Mother   . Heart attack Father   . Hypertension Father   . Diabetes type II Father   . Diabetes Father   . Hyperlipidemia Father   . Depression Sister   . Diabetes type II Sister   . Diabetes Sister   . Hyperlipidemia Brother   . Cancer Maternal Aunt   . Alcohol abuse Maternal Uncle   . Alcohol abuse Paternal Uncle    Allergies: Allergies   Allergen Reactions  . Ambien [Zolpidem Tartrate] Other (See Comments)    hallucinations   Medications: See med rec.  Review of Systems: No fevers, chills, night sweats, weight loss, chest pain, or shortness of breath.   Objective:    General: Well Developed, well nourished, and in no acute distress.  Neuro: Alert and oriented x3, extra-ocular muscles intact, sensation grossly intact.  HEENT: Normocephalic, atraumatic, pupils equal round reactive to light, neck supple, no masses, no lymphadenopathy, thyroid nonpalpable.  Skin: Warm and dry, no rashes. Cardiac: Regular rate and rhythm, no murmurs rubs or gallops, no lower extremity edema.  Respiratory: Clear to auscultation bilaterally. Not using accessory muscles, speaking in full sentences. Left knee: Normal to inspection with no erythema or effusion or obvious bony abnormalities. Tender to palpation at the patellar facets and the medial joint line ROM normal in flexion and extension and lower leg rotation. Ligaments with solid consistent endpoints including ACL, PCL, LCL, MCL. Negative Mcmurray's and provocative meniscal tests. Non painful patellar compression. Patellar and quadriceps tendons unremarkable. Hamstring and quadriceps strength is normal.  Procedure: Real-time Ultrasound Guided Injection of left knee Device: GE Logiq E  Verbal informed consent obtained.  Time-out conducted.  Noted no overlying erythema, induration, or other signs of local infection.  Skin prepped in a sterile fashion.  Local anesthesia: Topical Ethyl chloride.  With sterile technique and under real time ultrasound guidance: 1 cc Kenalog 40, 2 cc lidocaine, 2 cc bupivacaine injected easily. Completed without difficulty  Pain immediately resolved suggesting accurate placement of the medication.  Advised to call if fevers/chills, erythema, induration, drainage, or persistent bleeding.  Images permanently stored and available for review in the  ultrasound unit.  Impression: Technically successful ultrasound guided injection.  Impression and Recommendations:    Major depressive disorder, recurrent episode, moderate (HCC) Good improvement with the increase to 60 mg, going up to 120 of Cymbalta, return in 1 month for PHQ/GAD.  Migraine without status migrainosus, not intractable Has not had any headaches on Trokendi, they gave her a one-month trial. Because she has Medicare she cannot continue, so doing an up taper of generic Topamax.  Primary osteoarthritis of left knee Previous injection was in April, repeat left knee injection today. ___________________________________________ Gwen Her. Dianah Field, M.D., ABFM., CAQSM. Primary Care and Sports Medicine [facility]  Adjunct Professor of [place] of [facility] [employer] of Medicine

## 2018-09-02 NOTE — Assessment & Plan Note (Signed)
Good improvement with the increase to 60 mg, going up to 120 of Cymbalta, return in 1 month for PHQ/GAD.

## 2018-09-02 NOTE — Assessment & Plan Note (Signed)
Previous injection was in April, repeat left knee injection today.

## 2018-09-22 ENCOUNTER — Other Ambulatory Visit: Payer: Self-pay | Admitting: Sports Medicine

## 2018-09-22 DIAGNOSIS — M47816 Spondylosis without myelopathy or radiculopathy, lumbar region: Secondary | ICD-10-CM

## 2018-09-27 DIAGNOSIS — M5136 Other intervertebral disc degeneration, lumbar region: Secondary | ICD-10-CM | POA: Diagnosis not present

## 2018-09-27 DIAGNOSIS — M47816 Spondylosis without myelopathy or radiculopathy, lumbar region: Secondary | ICD-10-CM | POA: Diagnosis not present

## 2018-09-27 DIAGNOSIS — G894 Chronic pain syndrome: Secondary | ICD-10-CM | POA: Diagnosis not present

## 2018-09-27 DIAGNOSIS — M545 Low back pain: Secondary | ICD-10-CM | POA: Diagnosis not present

## 2018-09-30 ENCOUNTER — Other Ambulatory Visit: Payer: Self-pay | Admitting: Sports Medicine

## 2018-09-30 ENCOUNTER — Ambulatory Visit (INDEPENDENT_AMBULATORY_CARE_PROVIDER_SITE_OTHER): Payer: Medicare HMO | Admitting: Sports Medicine

## 2018-09-30 ENCOUNTER — Encounter: Payer: Self-pay | Admitting: Sports Medicine

## 2018-09-30 DIAGNOSIS — F314 Bipolar disorder, current episode depressed, severe, without psychotic features: Secondary | ICD-10-CM

## 2018-09-30 DIAGNOSIS — R69 Illness, unspecified: Secondary | ICD-10-CM | POA: Diagnosis not present

## 2018-09-30 DIAGNOSIS — F331 Major depressive disorder, recurrent, moderate: Secondary | ICD-10-CM | POA: Diagnosis not present

## 2018-09-30 DIAGNOSIS — R0789 Other chest pain: Secondary | ICD-10-CM

## 2018-09-30 HISTORY — DX: Other chest pain: R07.89

## 2018-09-30 MED ORDER — ALPRAZOLAM 0.5 MG PO TABS: 0.5000 mg | ORAL_TABLET | Freq: Every day | ORAL | 0 refills | Status: DC | PRN

## 2018-09-30 NOTE — Progress Notes (Signed)
Subjective:    CC: Follow-up  HPI: This is a pleasant 42 year old female, we have been treating her for anxiety, depression and bipolar disorder, she has done well historically on monotherapy with Cymbalta, relatively low dose.  No suicidal or homicidal ideation.  On further questioning she had an episode of hypomania approximately a month ago.  She does have significant anxiety with occasional panic, during these panic episodes she feels chest pain, pressure, palpitations.  They last approximately a few minutes to an hour and then disappear.  On further questioning they do also tend to occur with physical exertion and resolved with rest.  No nausea, diaphoresis, left arm pain.  I reviewed the past medical history, family history, social history, surgical history, and allergies today and no changes were needed.  Please see the problem list section below in epic for further details.  Past Medical History: Past Medical History:  Diagnosis Date  . Abnormal pap 1999   colpo  . Anxiety   . Arthritis of both knees 2011  . Chlamydia   . Depression   . GERD (gastroesophageal reflux disease)   . Hypertension   . Melanoma ([place])   . Migraines   . Ovarian cyst   . Pneumonia   . Trichomonas    Past Surgical History: Past Surgical History:  Procedure Laterality Date  . ABLATION    . APPENDECTOMY  1996  . KNEE ARTHROSCOPY  1991   Left Kneex2  . LYMPH NODE BIOPSY Left 12/11/2017   Procedure: SENTINEL LYMPH NODE MAPPING  BIOPSY;  Surgeon: Stark Klein, MD;  Location: [place];  Service: General;  Laterality: Left;  Marland Kitchen MELANOMA EXCISION Left 12/11/2017   Procedure: EXCISION OF PRIOR SCAR LEFT SHOULDER MELANOMA WITH CLOSURE;  Surgeon: Stark Klein, MD;  Location: [place];  Service: General;  Laterality: Left;  . Tonsilectomy  1984  . TONSILLECTOMY    . TUBAL LIGATION  1999  . TUBOPLASTY / [place]   Four times between 1980-1990   Social History: Social History    Socioeconomic History  . Marital status: Married    Spouse name: Not on file  . Number of children: Not on file  . Years of education: Not on file  . Highest education level: Not on file  Occupational History  . Not on file  Social Needs  . Financial resource strain: Not on file  . Food insecurity:    Worry: Not on file    Inability: Not on file  . Transportation needs:    Medical: Not on file    Non-medical: Not on file  Tobacco Use  . Smoking status: Former Smoker    Packs/day: 0.50    Years: 10.00    Pack years: 5.00    Types: Cigarettes    Last attempt to quit: 11/08/2009    Years since quitting: 8.8  . Smokeless tobacco: Never Used  Substance and Sexual Activity  . Alcohol use: No  . Drug use: No  . Sexual activity: Yes    Partners: Male    Birth control/protection: Other-see comments    Comment: Tubal Ligation  Lifestyle  . Physical activity:    Days per week: Not on file    Minutes per session: Not on file  . Stress: Not on file  Relationships  . Social connections:    Talks on phone: Not on file    Gets together: Not on file    Attends religious service: Not on file  Active member of club or organization: Not on file    Attends meetings of clubs or organizations: Not on file    Relationship status: Not on file  Other Topics Concern  . Not on file  Social History Narrative  . Not on file   Family History: Family History  Problem Relation Age of Onset  . Hypertension Maternal Grandfather   . Diabetes Maternal Grandfather   . Stroke Maternal Grandfather   . Hypertension Maternal Grandmother   . Cancer Maternal Grandmother   . Hypertension Paternal Grandfather   . Diabetes Paternal Grandfather   . Hypertension Paternal Grandmother   . Hypertension Mother   . Depression Mother   . Diabetes type II Mother   . Diabetes Mother   . Heart attack Father   . Hypertension Father   . Diabetes type II Father   . Diabetes Father   . Hyperlipidemia  Father   . Depression Sister   . Diabetes type II Sister   . Diabetes Sister   . Hyperlipidemia Brother   . Cancer Maternal Aunt   . Alcohol abuse Maternal Uncle   . Alcohol abuse Paternal Uncle    Allergies: Allergies  Allergen Reactions  . Ambien [Zolpidem Tartrate] Other (See Comments)    hallucinations   Medications: See med rec.  Review of Systems: No fevers, chills, night sweats, weight loss, chest pain, or shortness of breath.   Objective:    General: Well Developed, well nourished, and in no acute distress.  Neuro: Alert and oriented x3, extra-ocular muscles intact, sensation grossly intact.  HEENT: Normocephalic, atraumatic, pupils equal round reactive to light, neck supple, no masses, no lymphadenopathy, thyroid nonpalpable.  Skin: Warm and dry, no rashes. Cardiac: Regular rate and rhythm, no murmurs rubs or gallops, no lower extremity edema.  Respiratory: Clear to auscultation bilaterally. Not using accessory muscles, speaking in full sentences.  Impression and Recommendations:    Bipolar I disorder, severe, current or most recent episode depressed, with rapid cycling (HCC) Bipolar 1 disorder, rare episodes of mania, most recent was over a month ago. She feels well controlled on Cymbalta 120, she does have episodes that sound like panic, she did have a few episodes that occurred with physical exertion so we will proceed with a cardiac work-up as well, in the meantime adding alprazolam 0.5 mg to use as needed for anxiety and panic. Return in a month, if persistent symptoms or if no control with alprazolam we will add a mood stabilizer such as Latuda. She has been on Lamictal in the past and did not respond well.  Chest pain, atypical Did have a episodes of substernal chest pain with palpitations with exertion. Resolved with rest. I would like her to touch base with cardiology to discuss nuclear stress test, her knee arthritis precludes her from doing a treadmill  stress test. ___________________________________________ Gwen Her. Dianah Field, M.D., ABFM., CAQSM. Primary Care and Sports Medicine [facility]  Adjunct Professor of [place] of [employer] [employer] of Medicine

## 2018-09-30 NOTE — Assessment & Plan Note (Signed)
Did have a episodes of substernal chest pain with palpitations with exertion. Resolved with rest. I would like her to touch base with cardiology to discuss nuclear stress test, her knee arthritis precludes her from doing a treadmill stress test.

## 2018-09-30 NOTE — Assessment & Plan Note (Signed)
Bipolar 1 disorder, rare episodes of mania, most recent was over a month ago. She feels well controlled on Cymbalta 120, she does have episodes that sound like panic, she did have a few episodes that occurred with physical exertion so we will proceed with a cardiac work-up as well, in the meantime adding alprazolam 0.5 mg to use as needed for anxiety and panic. Return in a month, if persistent symptoms or if no control with alprazolam we will add a mood stabilizer such as Latuda. She has been on Lamictal in the past and did not respond well.

## 2018-10-07 ENCOUNTER — Ambulatory Visit: Payer: Self-pay | Admitting: Cardiology

## 2018-10-14 ENCOUNTER — Ambulatory Visit (INDEPENDENT_AMBULATORY_CARE_PROVIDER_SITE_OTHER): Payer: Medicare HMO | Admitting: Sports Medicine

## 2018-10-14 ENCOUNTER — Ambulatory Visit (INDEPENDENT_AMBULATORY_CARE_PROVIDER_SITE_OTHER): Payer: Medicare HMO

## 2018-10-14 ENCOUNTER — Encounter: Payer: Self-pay | Admitting: Sports Medicine

## 2018-10-14 DIAGNOSIS — R0989 Other specified symptoms and signs involving the circulatory and respiratory systems: Secondary | ICD-10-CM | POA: Diagnosis not present

## 2018-10-14 DIAGNOSIS — R059 Cough, unspecified: Secondary | ICD-10-CM

## 2018-10-14 DIAGNOSIS — U071 COVID-19: Secondary | ICD-10-CM

## 2018-10-14 DIAGNOSIS — R05 Cough: Secondary | ICD-10-CM

## 2018-10-14 HISTORY — DX: COVID-19: U07.1

## 2018-10-14 MED ORDER — AZITHROMYCIN 250 MG PO TABS: ORAL_TABLET | ORAL | 0 refills | Status: DC

## 2018-10-14 MED ORDER — HYDROCOD POLST-CPM POLST ER 10-8 MG/5ML PO SUER: 5.0000 mL | Freq: Two times a day (BID) | ORAL | 0 refills | Status: DC | PRN

## 2018-10-14 MED ORDER — PREDNISONE 50 MG PO TABS: 50.0000 mg | ORAL_TABLET | Freq: Every day | ORAL | 0 refills | Status: DC

## 2018-10-14 NOTE — Assessment & Plan Note (Signed)
Persistent cough for over 2 weeks, prednisone, azithromycin, Tussionex, chest x-ray.

## 2018-10-14 NOTE — Progress Notes (Signed)
Subjective:    CC: Coughing  HPI: This is a pleasant 42 year old female, for the past 2 weeks she has had a nonproductive cough, keeping her up at night, no chest pain, shortness of breath.  No fevers, chills, night sweats, no GI symptoms, no skin rash.  I reviewed the past medical history, family history, social history, surgical history, and allergies today and no changes were needed.  Please see the problem list section below in epic for further details.  Past Medical History: Past Medical History:  Diagnosis Date  . Abnormal pap 1999   colpo  . Anxiety   . Arthritis of both knees 2011  . Chlamydia   . Depression   . GERD (gastroesophageal reflux disease)   . Hypertension   . Melanoma ([place])   . Migraines   . Ovarian cyst   . Pneumonia   . Trichomonas    Past Surgical History: Past Surgical History:  Procedure Laterality Date  . ABLATION    . APPENDECTOMY  1996  . KNEE ARTHROSCOPY  1991   Left Kneex2  . LYMPH NODE BIOPSY Left 12/11/2017   Procedure: SENTINEL LYMPH NODE MAPPING  BIOPSY;  Surgeon: Stark Klein, MD;  Location: [place];  Service: General;  Laterality: Left;  Marland Kitchen MELANOMA EXCISION Left 12/11/2017   Procedure: EXCISION OF PRIOR SCAR LEFT SHOULDER MELANOMA WITH CLOSURE;  Surgeon: Stark Klein, MD;  Location: [place];  Service: General;  Laterality: Left;  . Tonsilectomy  1984  . TONSILLECTOMY    . TUBAL LIGATION  1999  . TUBOPLASTY / [place]   Four times between 1980-1990   Social History: Social History   Socioeconomic History  . Marital status: Married    Spouse name: Not on file  . Number of children: Not on file  . Years of education: Not on file  . Highest education level: Not on file  Occupational History  . Not on file  Social Needs  . Financial resource strain: Not on file  . Food insecurity:    Worry: Not on file    Inability: Not on file  . Transportation needs:    Medical: Not on file    Non-medical: Not on file    Tobacco Use  . Smoking status: Former Smoker    Packs/day: 0.50    Years: 10.00    Pack years: 5.00    Types: Cigarettes    Last attempt to quit: 11/08/2009    Years since quitting: 8.9  . Smokeless tobacco: Never Used  Substance and Sexual Activity  . Alcohol use: No  . Drug use: No  . Sexual activity: Yes    Partners: Male    Birth control/protection: Other-see comments    Comment: Tubal Ligation  Lifestyle  . Physical activity:    Days per week: Not on file    Minutes per session: Not on file  . Stress: Not on file  Relationships  . Social connections:    Talks on phone: Not on file    Gets together: Not on file    Attends religious service: Not on file    Active member of club or organization: Not on file    Attends meetings of clubs or organizations: Not on file    Relationship status: Not on file  Other Topics Concern  . Not on file  Social History Narrative  . Not on file   Family History: Family History  Problem Relation Age of Onset  . Hypertension Maternal Grandfather   .  Diabetes Maternal Grandfather   . Stroke Maternal Grandfather   . Hypertension Maternal Grandmother   . Cancer Maternal Grandmother   . Hypertension Paternal Grandfather   . Diabetes Paternal Grandfather   . Hypertension Paternal Grandmother   . Hypertension Mother   . Depression Mother   . Diabetes type II Mother   . Diabetes Mother   . Heart attack Father   . Hypertension Father   . Diabetes type II Father   . Diabetes Father   . Hyperlipidemia Father   . Depression Sister   . Diabetes type II Sister   . Diabetes Sister   . Hyperlipidemia Brother   . Cancer Maternal Aunt   . Alcohol abuse Maternal Uncle   . Alcohol abuse Paternal Uncle    Allergies: Allergies  Allergen Reactions  . Ambien [Zolpidem Tartrate] Other (See Comments)    hallucinations   Medications: See med rec.  Review of Systems: No fevers, chills, night sweats, weight loss, chest pain, or shortness of  breath.   Objective:    General: Well Developed, well nourished, and in no acute distress.  Neuro: Alert and oriented x3, extra-ocular muscles intact, sensation grossly intact.  HEENT: Normocephalic, atraumatic, pupils equal round reactive to light, neck supple, no masses, no lymphadenopathy, thyroid nonpalpable.  Oropharynx, nasopharynx, ear canals unremarkable. Skin: Warm and dry, no rashes. Cardiac: Regular rate and rhythm, no murmurs rubs or gallops, no lower extremity edema.  Respiratory: Clear to auscultation bilaterally. Not using accessory muscles, speaking in full sentences.  Coughing in the exam room.  Impression and Recommendations:    Coughing Persistent cough for over 2 weeks, prednisone, azithromycin, Tussionex, chest x-ray. ___________________________________________ Gwen Her. Dianah Field, M.D., ABFM., CAQSM. Primary Care and Sports Medicine [facility]  Adjunct Professor of [place] [employer] [facility] [employer]

## 2018-10-16 ENCOUNTER — Other Ambulatory Visit: Payer: Self-pay | Admitting: Sports Medicine

## 2018-10-16 DIAGNOSIS — G894 Chronic pain syndrome: Secondary | ICD-10-CM

## 2018-10-16 DIAGNOSIS — F314 Bipolar disorder, current episode depressed, severe, without psychotic features: Secondary | ICD-10-CM

## 2018-10-28 ENCOUNTER — Ambulatory Visit: Payer: Self-pay | Admitting: Sports Medicine

## 2018-11-12 DIAGNOSIS — G894 Chronic pain syndrome: Secondary | ICD-10-CM | POA: Diagnosis not present

## 2018-11-12 DIAGNOSIS — M47816 Spondylosis without myelopathy or radiculopathy, lumbar region: Secondary | ICD-10-CM | POA: Diagnosis not present

## 2018-11-12 DIAGNOSIS — M94262 Chondromalacia, left knee: Secondary | ICD-10-CM | POA: Diagnosis not present

## 2019-01-08 ENCOUNTER — Other Ambulatory Visit: Payer: Self-pay | Admitting: Sports Medicine

## 2019-01-08 ENCOUNTER — Ambulatory Visit: Payer: Self-pay | Admitting: Sports Medicine

## 2019-01-08 ENCOUNTER — Encounter: Payer: Self-pay | Admitting: Sports Medicine

## 2019-01-08 DIAGNOSIS — G43909 Migraine, unspecified, not intractable, without status migrainosus: Secondary | ICD-10-CM

## 2019-01-08 DIAGNOSIS — K047 Periapical abscess without sinus: Secondary | ICD-10-CM

## 2019-01-08 HISTORY — DX: Periapical abscess without sinus: K04.7

## 2019-01-08 MED ORDER — AMOXICILLIN-POT CLAVULANATE 875-125 MG PO TABS: 1.0000 | ORAL_TABLET | Freq: Two times a day (BID) | ORAL | 0 refills | Status: AC

## 2019-01-08 NOTE — Assessment & Plan Note (Signed)
Adding Augmentin. Further treatment absolutely needs to be with her dentist.

## 2019-01-27 ENCOUNTER — Encounter: Payer: Self-pay | Admitting: Sports Medicine

## 2019-01-27 NOTE — Telephone Encounter (Signed)
This needs to be in person, they do not have any way to check vital signs other than temperature at their house.  Since she does not have a real fever I think it is fine for her to come in as long as she is masked when she gets here.

## 2019-01-28 NOTE — Telephone Encounter (Signed)
Contacted patient at number on file and voicemail was not set up.

## 2019-01-29 ENCOUNTER — Ambulatory Visit (INDEPENDENT_AMBULATORY_CARE_PROVIDER_SITE_OTHER): Payer: Medicare HMO | Admitting: Sports Medicine

## 2019-01-29 ENCOUNTER — Ambulatory Visit (INDEPENDENT_AMBULATORY_CARE_PROVIDER_SITE_OTHER): Payer: Medicare HMO

## 2019-01-29 DIAGNOSIS — R05 Cough: Secondary | ICD-10-CM | POA: Diagnosis not present

## 2019-01-29 DIAGNOSIS — R059 Cough, unspecified: Secondary | ICD-10-CM

## 2019-01-29 NOTE — Patient Instructions (Addendum)
Upper Respiratory Infection, Adult An upper respiratory infection (URI) is a common viral infection of the nose, throat, and upper air passages that lead to the lungs. The most common type of URI is the common cold. URIs usually get better on their own, without medical treatment. What are the causes? A URI is caused by a virus. You may catch a virus by:  Breathing in droplets from an infected person's cough or sneeze.  Touching something that has been exposed to the virus (contaminated) and then touching your mouth, nose, or eyes. What increases the risk? You are more likely to get a URI if:  You are very young or very old.  It is autumn or winter.  You have close contact with others, such as at a daycare, school, or health care facility.  You smoke.  You have long-term (chronic) heart or lung disease.  You have a weakened disease-fighting (immune) system.  You have nasal allergies or asthma.  You are experiencing a lot of stress.  You work in an area that has poor air circulation.  You have poor nutrition. What are the signs or symptoms? A URI usually involves some of the following symptoms:  Runny or stuffy (congested) nose.  Sneezing.  Cough.  Sore throat.  Headache.  Fatigue.  Fever.  Loss of appetite.  Pain in your forehead, behind your eyes, and over your cheekbones (sinus pain).  Muscle aches.  Redness or irritation of the eyes.  Pressure in the ears or face. How is this diagnosed? This condition may be diagnosed based on your medical history and symptoms, and a physical exam. Your health care provider may use a cotton swab to take a mucus sample from your nose (nasal swab). This sample can be tested to determine what virus is causing the illness. How is this treated? URIs usually get better on their own within 7-10 days. You can take steps at home to relieve your symptoms. Medicines cannot cure URIs, but your health care provider may recommend  certain medicines to help relieve symptoms, such as:  Over-the-counter cold medicines.  Cough suppressants. Coughing is a type of defense against infection that helps to clear the respiratory system, so take these medicines only as recommended by your health care provider.  Fever-reducing medicines. Follow these instructions at home: Activity  Rest as needed.  If you have a fever, stay home from work or school until your fever is gone or until your health care provider says you are no longer contagious. Your health care provider may have you wear a face mask to prevent your infection from spreading. Relieving symptoms  Gargle with a salt-water mixture 3-4 times a day or as needed. To make a salt-water mixture, completely dissolve -1 tsp of salt in 1 cup of warm water.  Use a cool-mist humidifier to add moisture to the air. This can help you breathe more easily. Eating and drinking   Drink enough fluid to keep your urine pale yellow.  Eat soups and other clear broths. General instructions   Take over-the-counter and prescription medicines only as told by your health care provider. These include cold medicines, fever reducers, and cough suppressants.  Do not use any products that contain nicotine or tobacco, such as cigarettes and e-cigarettes. If you need help quitting, ask your health care provider.  Stay away from secondhand smoke.  Stay up to date on all immunizations, including the yearly (annual) flu vaccine.  Keep all follow-up visits as told by your health  care provider. This is important. How to prevent the spread of infection to others   URIs can be passed from person to person (are contagious). To prevent the infection from spreading: ? Wash your hands often with soap and water. If soap and water are not available, use hand sanitizer. ? Avoid touching your mouth, face, eyes, or nose. ? Cough or sneeze into a tissue or your sleeve or elbow instead of into your hand  or into the air. Contact a health care provider if:  You are getting worse instead of better.  You have a fever or chills.  Your mucus is brown or red.  You have yellow or brown discharge coming from your nose.  You have pain in your face, especially when you bend forward.  You have swollen neck glands.  You have pain while swallowing.  You have white areas in the back of your throat. Get help right away if:  You have shortness of breath that gets worse.  You have severe or persistent: ? Headache. ? Ear pain. ? Sinus pain. ? Chest pain.  You have chronic lung disease along with any of the following: ? Wheezing. ? Prolonged cough. ? Coughing up blood. ? A change in your usual mucus.  You have a stiff neck.  You have changes in your: ? Vision. ? Hearing. ? Thinking. ? Mood. Summary  An upper respiratory infection (URI) is a common infection of the nose, throat, and upper air passages that lead to the lungs.  A URI is caused by a virus.  URIs usually get better on their own within 7-10 days.  Medicines cannot cure URIs, but your health care provider may recommend certain medicines to help relieve symptoms. This information is not intended to replace advice given to you by your health care provider. Make sure you discuss any questions you have with your health care provider. Document Released: 03/28/2001 Document Revised: 05/18/2017 Document Reviewed: 05/18/2017 Elsevier Interactive Patient Education  2019 [employer].

## 2019-01-29 NOTE — Progress Notes (Signed)
Subjective:    CC: Coughing  HPI: For the past several days this pleasant 43 year old female has had a mild cough, increasing phlegm production.  No visual changes, no fevers above 99 F, no chills, no shortness of breath, no chest pain, GI symptoms, no skin rash.  Minimal fullness in both ears.  I reviewed the past medical history, family history, social history, surgical history, and allergies today and no changes were needed.  Please see the problem list section below in epic for further details.  Past Medical History: Past Medical History:  Diagnosis Date  . Abnormal pap 1999   colpo  . Anxiety   . Arthritis of both knees 2011  . Chlamydia   . Depression   . GERD (gastroesophageal reflux disease)   . Hypertension   . Melanoma ([place])   . Migraines   . Ovarian cyst   . Pneumonia   . Trichomonas    Past Surgical History: Past Surgical History:  Procedure Laterality Date  . ABLATION    . APPENDECTOMY  1996  . KNEE ARTHROSCOPY  1991   Left Kneex2  . LYMPH NODE BIOPSY Left 12/11/2017   Procedure: SENTINEL LYMPH NODE MAPPING  BIOPSY;  Surgeon: Stark Klein, MD;  Location: [place];  Service: General;  Laterality: Left;  Marland Kitchen MELANOMA EXCISION Left 12/11/2017   Procedure: EXCISION OF PRIOR SCAR LEFT SHOULDER MELANOMA WITH CLOSURE;  Surgeon: Stark Klein, MD;  Location: [place];  Service: General;  Laterality: Left;  . Tonsilectomy  1984  . TONSILLECTOMY    . TUBAL LIGATION  1999  . TUBOPLASTY / [place]   Four times between 1980-1990   Social History: Social History   Socioeconomic History  . Marital status: Married    Spouse name: Not on file  . Number of children: Not on file  . Years of education: Not on file  . Highest education level: Not on file  Occupational History  . Not on file  Social Needs  . Financial resource strain: Not on file  . Food insecurity:    Worry: Not on file    Inability: Not on file  . Transportation needs:    Medical: Not  on file    Non-medical: Not on file  Tobacco Use  . Smoking status: Former Smoker    Packs/day: 0.50    Years: 10.00    Pack years: 5.00    Types: Cigarettes    Last attempt to quit: 11/08/2009    Years since quitting: 9.2  . Smokeless tobacco: Never Used  Substance and Sexual Activity  . Alcohol use: No  . Drug use: No  . Sexual activity: Yes    Partners: Male    Birth control/protection: Other-see comments    Comment: Tubal Ligation  Lifestyle  . Physical activity:    Days per week: Not on file    Minutes per session: Not on file  . Stress: Not on file  Relationships  . Social connections:    Talks on phone: Not on file    Gets together: Not on file    Attends religious service: Not on file    Active member of club or organization: Not on file    Attends meetings of clubs or organizations: Not on file    Relationship status: Not on file  Other Topics Concern  . Not on file  Social History Narrative  . Not on file   Family History: Family History  Problem Relation Age of Onset  .  Hypertension Maternal Grandfather   . Diabetes Maternal Grandfather   . Stroke Maternal Grandfather   . Hypertension Maternal Grandmother   . Cancer Maternal Grandmother   . Hypertension Paternal Grandfather   . Diabetes Paternal Grandfather   . Hypertension Paternal Grandmother   . Hypertension Mother   . Depression Mother   . Diabetes type II Mother   . Diabetes Mother   . Heart attack Father   . Hypertension Father   . Diabetes type II Father   . Diabetes Father   . Hyperlipidemia Father   . Depression Sister   . Diabetes type II Sister   . Diabetes Sister   . Hyperlipidemia Brother   . Cancer Maternal Aunt   . Alcohol abuse Maternal Uncle   . Alcohol abuse Paternal Uncle    Allergies: Allergies  Allergen Reactions  . Ambien [Zolpidem Tartrate] Other (See Comments)    hallucinations   Medications: See med rec.  Review of Systems: No fevers, chills, night sweats,  weight loss, chest pain, or shortness of breath.   Objective:    General: Well Developed, well nourished, and in no acute distress.  Neuro: Alert and oriented x3, extra-ocular muscles intact, sensation grossly intact.  HEENT: Normocephalic, atraumatic, pupils equal round reactive to light, neck supple, no masses, no lymphadenopathy, thyroid nonpalpable.  Oropharynx, nasopharynx, ear canals unremarkable. Skin: Warm and dry, no rashes. Cardiac: Regular rate and rhythm, no murmurs rubs or gallops, no lower extremity edema.  Respiratory: Clear to auscultation bilaterally. Not using accessory muscles, speaking in full sentences.  Chest x-ray obtained and clear.  Impression and Recommendations:    Coughing Viral URI. Do not suspect COVID19. Symptomatic treatment, hydration. CXR today. Return if no better in 2 weeks.   ___________________________________________ Gwen Her. Dianah Field, M.D., ABFM., CAQSM. Primary Care and Sports Medicine [facility]  Adjunct Professor of [place] [employer] [facility] [employer]

## 2019-01-29 NOTE — Assessment & Plan Note (Signed)
Viral URI. Do not suspect COVID19. Symptomatic treatment, hydration. CXR today. Return if no better in 2 weeks.

## 2019-02-01 ENCOUNTER — Other Ambulatory Visit: Payer: Self-pay | Admitting: Sports Medicine

## 2019-02-01 DIAGNOSIS — F314 Bipolar disorder, current episode depressed, severe, without psychotic features: Secondary | ICD-10-CM

## 2019-02-01 DIAGNOSIS — F331 Major depressive disorder, recurrent, moderate: Secondary | ICD-10-CM

## 2019-02-11 DIAGNOSIS — M94262 Chondromalacia, left knee: Secondary | ICD-10-CM | POA: Diagnosis not present

## 2019-02-11 DIAGNOSIS — M47816 Spondylosis without myelopathy or radiculopathy, lumbar region: Secondary | ICD-10-CM | POA: Diagnosis not present

## 2019-02-11 DIAGNOSIS — G894 Chronic pain syndrome: Secondary | ICD-10-CM | POA: Diagnosis not present

## 2019-02-11 DIAGNOSIS — M170 Bilateral primary osteoarthritis of knee: Secondary | ICD-10-CM | POA: Diagnosis not present

## 2019-03-03 ENCOUNTER — Encounter: Payer: Self-pay | Admitting: Sports Medicine

## 2019-03-04 ENCOUNTER — Ambulatory Visit (INDEPENDENT_AMBULATORY_CARE_PROVIDER_SITE_OTHER): Payer: Medicare HMO | Admitting: Sports Medicine

## 2019-03-04 DIAGNOSIS — G894 Chronic pain syndrome: Secondary | ICD-10-CM

## 2019-03-04 MED ORDER — PREDNISONE 50 MG PO TABS: 50.0000 mg | ORAL_TABLET | Freq: Every day | ORAL | 0 refills | Status: DC

## 2019-03-04 MED ORDER — ONDANSETRON 8 MG PO TBDP: 8.0000 mg | ORAL_TABLET | Freq: Three times a day (TID) | ORAL | 3 refills | Status: DC | PRN

## 2019-03-04 MED ORDER — AZITHROMYCIN 250 MG PO TABS: ORAL_TABLET | ORAL | 0 refills | Status: DC

## 2019-03-04 NOTE — Progress Notes (Signed)
Virtual Visit via WebEx/MyChart   I connected with  Evelyn Sanchez  on 03/04/19 via WebEx/MyChart/Doximity Video and verified that I am speaking with the correct person using two identifiers.   I discussed the limitations, risks, security and privacy concerns of performing an evaluation and management service by WebEx/MyChart/Doximity Video, including the higher likelihood of inaccurate diagnosis and treatment, and the availability of in person appointments.  We also discussed the likely need of an additional face to face encounter for complete and high quality delivery of care.  I also discussed with the patient that there may be a patient responsible charge related to this service. The patient expressed understanding and wishes to proceed.  Provider location is either at home or medical facility. Patient location is at their home, different from provider location. People involved in care of the patient during this telehealth encounter were myself, my nurse/medical assistant, and my front office/scheduling team member.  Subjective:    CC: Feeling sick  HPI: This is a pleasant 43 year old female with chronic pain, historically on keeping her from patches, 7.5 mg.  She was recently increased to 10 mg by her pain doctor, felt a little bit sick, and was advised to stop.  She then developed chills, nausea, vomiting, diarrhea, and increasing pain.  She did not go back to her 7.5 mg patch.  She simply stopped cold [place].  No fevers, she has subjective shortness of breath but is speaking full sentences on the virtual visit, no nasal flaring or other accessory muscle use, no coughing during the visit.  I reviewed the past medical history, family history, social history, surgical history, and allergies today and no changes were needed.  Please see the problem list section below in epic for further details.  Past Medical History: Past Medical History:  Diagnosis Date  . Abnormal pap 1999   colpo  .  Anxiety   . Arthritis of both knees 2011  . Chlamydia   . Depression   . GERD (gastroesophageal reflux disease)   . Hypertension   . Melanoma ([place])   . Migraines   . Ovarian cyst   . Pneumonia   . Trichomonas    Past Surgical History: Past Surgical History:  Procedure Laterality Date  . ABLATION    . APPENDECTOMY  1996  . KNEE ARTHROSCOPY  1991   Left Kneex2  . LYMPH NODE BIOPSY Left 12/11/2017   Procedure: SENTINEL LYMPH NODE MAPPING  BIOPSY;  Surgeon: Stark Klein, MD;  Location: [place];  Service: General;  Laterality: Left;  Marland Kitchen MELANOMA EXCISION Left 12/11/2017   Procedure: EXCISION OF PRIOR SCAR LEFT SHOULDER MELANOMA WITH CLOSURE;  Surgeon: Stark Klein, MD;  Location: [place];  Service: General;  Laterality: Left;  . Tonsilectomy  1984  . TONSILLECTOMY    . TUBAL LIGATION  1999  . TUBOPLASTY / [place]   Four times between 1980-1990   Social History: Social History   Socioeconomic History  . Marital status: Married    Spouse name: Not on file  . Number of children: Not on file  . Years of education: Not on file  . Highest education level: Not on file  Occupational History  . Not on file  Social Needs  . Financial resource strain: Not on file  . Food insecurity:    Worry: Not on file    Inability: Not on file  . Transportation needs:    Medical: Not on file    Non-medical: Not on  file  Tobacco Use  . Smoking status: Former Smoker    Packs/day: 0.50    Years: 10.00    Pack years: 5.00    Types: Cigarettes    Last attempt to quit: 11/08/2009    Years since quitting: 9.3  . Smokeless tobacco: Never Used  Substance and Sexual Activity  . Alcohol use: No  . Drug use: No  . Sexual activity: Yes    Partners: Male    Birth control/protection: Other-see comments    Comment: Tubal Ligation  Lifestyle  . Physical activity:    Days per week: Not on file    Minutes per session: Not on file  . Stress: Not on file  Relationships  . Social  connections:    Talks on phone: Not on file    Gets together: Not on file    Attends religious service: Not on file    Active member of club or organization: Not on file    Attends meetings of clubs or organizations: Not on file    Relationship status: Not on file  Other Topics Concern  . Not on file  Social History Narrative  . Not on file   Family History: Family History  Problem Relation Age of Onset  . Hypertension Maternal Grandfather   . Diabetes Maternal Grandfather   . Stroke Maternal Grandfather   . Hypertension Maternal Grandmother   . Cancer Maternal Grandmother   . Hypertension Paternal Grandfather   . Diabetes Paternal Grandfather   . Hypertension Paternal Grandmother   . Hypertension Mother   . Depression Mother   . Diabetes type II Mother   . Diabetes Mother   . Heart attack Father   . Hypertension Father   . Diabetes type II Father   . Diabetes Father   . Hyperlipidemia Father   . Depression Sister   . Diabetes type II Sister   . Diabetes Sister   . Hyperlipidemia Brother   . Cancer Maternal Aunt   . Alcohol abuse Maternal Uncle   . Alcohol abuse Paternal Uncle    Allergies: Allergies  Allergen Reactions  . Ambien [Zolpidem Tartrate] Other (See Comments)    hallucinations   Medications: See med rec.  Review of Systems: No fevers, chills, night sweats, weight loss, chest pain, or shortness of breath.   Objective:    General: Speaking full sentences, no audible heavy breathing.  Sounds alert and appropriately interactive.  Appears well.  Face symmetric.  Extraocular movements intact.  Pupils equal and round.  No nasal flaring or accessory muscle use visualized.  No other physical exam performed due to the non-physical nature of this visit.  Impression and Recommendations:    Chronic pain syndrome Experiencing nausea, diarrhea, aches, chills. No overt fevers. She was on buprenorphine 7.5 mg, recently increased to 10 mg, but stopped abruptly by  pain management. Her symptoms do resemble opiate withdrawal. She does have a cough. She is speaking full sentences through the virtual visit, no nasal flaring, no accessory muscle use. Zofran for nausea. Adding a prednisone and azithromycin, she will see me tomorrow to further discuss. I have advised that she go back to the 7.5 mg keeping her from patch but she desires to write out the withdrawal symptoms. She does need to discuss this further with her pain management provider as well.  I discussed the above assessment and treatment plan with the patient. The patient was provided an opportunity to ask questions and all were answered. The patient agreed  with the plan and demonstrated an understanding of the instructions.   The patient was advised to call back or seek an in-person evaluation if the symptoms worsen or if the condition fails to improve as anticipated.   I provided 25 minutes of non-face-to-face time during this encounter, 15 minutes of additional time was needed to gather information, review chart, records, communicate/coordinate with staff remotely, troubleshooting the multiple errors that we get every time when trying to do video calls through the electronic medical record, WebEx, and Doximity, restart the encounter multiple times due to instability of the software, as well as complete documentation.   ___________________________________________ Gwen Her. Dianah Field, M.D., ABFM., CAQSM. Primary Care and Sports Medicine [facility]  Adjunct Professor of [place] of [facility] [employer] Medicine

## 2019-03-04 NOTE — Assessment & Plan Note (Addendum)
Experiencing nausea, diarrhea, aches, chills. No overt fevers. She was on buprenorphine 7.5 mg, recently increased to 10 mg, but stopped abruptly by pain management. Her symptoms do resemble opiate withdrawal. She does have a cough. She is speaking full sentences through the virtual visit, no nasal flaring, no accessory muscle use. Zofran for nausea. Adding a prednisone and azithromycin, she will see me tomorrow to further discuss. I have advised that she go back to the 7.5 mg keeping her from patch but she desires to write out the withdrawal symptoms. She does need to discuss this further with her pain management provider as well.

## 2019-03-05 ENCOUNTER — Encounter: Payer: Self-pay | Admitting: Sports Medicine

## 2019-03-05 ENCOUNTER — Ambulatory Visit (INDEPENDENT_AMBULATORY_CARE_PROVIDER_SITE_OTHER): Payer: Medicare HMO | Admitting: Sports Medicine

## 2019-03-05 DIAGNOSIS — G894 Chronic pain syndrome: Secondary | ICD-10-CM | POA: Diagnosis not present

## 2019-03-05 DIAGNOSIS — R6883 Chills (without fever): Secondary | ICD-10-CM | POA: Insufficient documentation

## 2019-03-05 HISTORY — DX: Chills (without fever): R68.83

## 2019-03-05 LAB — UNLABELED: Test Ordered On Req: 39448

## 2019-03-05 LAB — POCT INFLUENZA A/B
Influenza A, POC: NEGATIVE
Influenza B, POC: NEGATIVE

## 2019-03-05 NOTE — Progress Notes (Addendum)
Subjective:    CC: Feeling sick  HPI: Evelyn Sanchez is a pleasant 43 year old female with chronic pain, she has historically been on a high dose of buprenorphine and a patch, more recently she self discontinued her patch, she desires to get off of buprenorphine, she tells me it is difficult to find the patches at pharmacies, and this makes her refills inconsistent.  Because of this and her sudden discontinuation of her patch she has developed increasing aches and pains, nausea, vomiting, diarrhea, significant shaking.  No overt fevers, chills.  She does have a bit of cough, runny nose.  Symptoms are moderate, persistent.  She has not yet discussed this with her pain management provider.  I reviewed the past medical history, family history, social history, surgical history, and allergies today and no changes were needed.  Please see the problem list section below in epic for further details.  Past Medical History: Past Medical History:  Diagnosis Date  . Abnormal pap 1999   colpo  . Anxiety   . Arthritis of both knees 2011  . Chlamydia   . Depression   . GERD (gastroesophageal reflux disease)   . Hypertension   . Melanoma ([place])   . Migraines   . Ovarian cyst   . Pneumonia   . Trichomonas    Past Surgical History: Past Surgical History:  Procedure Laterality Date  . ABLATION    . APPENDECTOMY  1996  . KNEE ARTHROSCOPY  1991   Left Kneex2  . LYMPH NODE BIOPSY Left 12/11/2017   Procedure: SENTINEL LYMPH NODE MAPPING  BIOPSY;  Surgeon: Stark Klein, MD;  Location: [place];  Service: General;  Laterality: Left;  Marland Kitchen MELANOMA EXCISION Left 12/11/2017   Procedure: EXCISION OF PRIOR SCAR LEFT SHOULDER MELANOMA WITH CLOSURE;  Surgeon: Stark Klein, MD;  Location: [place];  Service: General;  Laterality: Left;  . Tonsilectomy  1984  . TONSILLECTOMY    . TUBAL LIGATION  1999  . TUBOPLASTY / [place]   Four times between 1980-1990   Social History: Social History    Socioeconomic History  . Marital status: Married    Spouse name: Not on file  . Number of children: Not on file  . Years of education: Not on file  . Highest education level: Not on file  Occupational History  . Not on file  Social Needs  . Financial resource strain: Not on file  . Food insecurity:    Worry: Not on file    Inability: Not on file  . Transportation needs:    Medical: Not on file    Non-medical: Not on file  Tobacco Use  . Smoking status: Former Smoker    Packs/day: 0.50    Years: 10.00    Pack years: 5.00    Types: Cigarettes    Last attempt to quit: 11/08/2009    Years since quitting: 9.3  . Smokeless tobacco: Never Used  Substance and Sexual Activity  . Alcohol use: No  . Drug use: No  . Sexual activity: Yes    Partners: Male    Birth control/protection: Other-see comments    Comment: Tubal Ligation  Lifestyle  . Physical activity:    Days per week: Not on file    Minutes per session: Not on file  . Stress: Not on file  Relationships  . Social connections:    Talks on phone: Not on file    Gets together: Not on file    Attends religious service: Not  on file    Active member of club or organization: Not on file    Attends meetings of clubs or organizations: Not on file    Relationship status: Not on file  Other Topics Concern  . Not on file  Social History Narrative  . Not on file   Family History: Family History  Problem Relation Age of Onset  . Hypertension Maternal Grandfather   . Diabetes Maternal Grandfather   . Stroke Maternal Grandfather   . Hypertension Maternal Grandmother   . Cancer Maternal Grandmother   . Hypertension Paternal Grandfather   . Diabetes Paternal Grandfather   . Hypertension Paternal Grandmother   . Hypertension Mother   . Depression Mother   . Diabetes type II Mother   . Diabetes Mother   . Heart attack Father   . Hypertension Father   . Diabetes type II Father   . Diabetes Father   . Hyperlipidemia Father    . Depression Sister   . Diabetes type II Sister   . Diabetes Sister   . Hyperlipidemia Brother   . Cancer Maternal Aunt   . Alcohol abuse Maternal Uncle   . Alcohol abuse Paternal Uncle    Allergies: Allergies  Allergen Reactions  . Ambien [Zolpidem Tartrate] Other (See Comments)    hallucinations   Medications: See med rec.  Review of Systems: No fevers, chills, night sweats, weight loss, chest pain, or shortness of breath.   Objective:    General: Well Developed, well nourished, and in no acute distress.  Neuro: Alert and oriented x3, extra-ocular muscles intact, sensation grossly intact.  HEENT: Normocephalic, atraumatic, pupils equal round reactive to light, neck supple, no masses, no lymphadenopathy, thyroid nonpalpable.  Oropharynx, nasopharynx, ear canals unremarkable. Skin: Warm and dry, no rashes. Cardiac: Regular rate and rhythm, no murmurs rubs or gallops, no lower extremity edema.  Respiratory: Clear to auscultation bilaterally. Not using accessory muscles, speaking in full sentences.  Utilizing appropriate PPE I performed bilateral nasopharyngeal swabs for influenza and COVID-19.  Impression and Recommendations:    Shivering I do think Evelyn Sanchez is suffering from opiate withdrawal, her ibuprofen dose is decreased significantly, she stopped cold [place], I did recommend yesterday that she go to the lower dose patch and contact her pain provider to prevent withdrawal symptoms.  These are my same recommendations today. I added prednisone, Zofran for nausea, antibiotics. I am going to swab her today for influenza and COVID-19. She will quarantine for 2 weeks or until test is negative.  Rapid flu test is negative, awaiting COVID-19 test.  Chronic pain syndrome I do think Evelyn Sanchez is suffering from opiate withdrawal, her ibuprofen dose is decreased significantly, she stopped cold [place], I did recommend yesterday that she go to the lower dose patch and contact her pain  provider to prevent withdrawal symptoms.  These are my same recommendations today.   ___________________________________________ Gwen Her. Dianah Field, M.D., ABFM., CAQSM. Primary Care and Sports Medicine [facility]  Adjunct Professor of [place] [employer] [facility] of Medicine

## 2019-03-05 NOTE — Assessment & Plan Note (Signed)
I do think Evelyn Sanchez is suffering from opiate withdrawal, her ibuprofen dose is decreased significantly, she stopped cold [place], I did recommend yesterday that she go to the lower dose patch and contact her pain provider to prevent withdrawal symptoms.  These are my same recommendations today.

## 2019-03-05 NOTE — Assessment & Plan Note (Addendum)
I do think Evelyn Sanchez is suffering from opiate withdrawal, her ibuprofen dose is decreased significantly, she stopped cold [place], I did recommend yesterday that she go to the lower dose patch and contact her pain provider to prevent withdrawal symptoms.  These are my same recommendations today. I added prednisone, Zofran for nausea, antibiotics. I am going to swab her today for influenza and COVID-19. She will quarantine for 2 weeks or until test is negative.  Rapid flu test is negative, awaiting COVID-19 test.

## 2019-03-05 NOTE — Addendum Note (Signed)
Addended by: Jamesetta So on: 03/05/2019 02:47 PM   Modules accepted: Orders

## 2019-03-13 LAB — PAT ID TIQ DOC: Test Affected: 39448

## 2019-03-13 LAB — SARS-COV-2 RNA,(COVID-19) QUALITATIVE NAAT: SARS CoV2 RNA: DETECTED — AB

## 2019-03-14 ENCOUNTER — Telehealth: Payer: Self-pay | Admitting: Sports Medicine

## 2019-03-14 DIAGNOSIS — U071 COVID-19: Secondary | ICD-10-CM

## 2019-03-14 DIAGNOSIS — R0789 Other chest pain: Secondary | ICD-10-CM

## 2019-03-14 NOTE — Assessment & Plan Note (Signed)
Positive COVID-19 swab, she never had a fever. Symptoms were chills, fatigue, cough. We started prednisone early, and her symptoms are for the most part gone, she has a mild sore throat, a mild dry cough but otherwise feels significantly better. She will continue to self quarantine until symptoms are gone for 2 weeks.

## 2019-03-14 NOTE — Telephone Encounter (Signed)
Evelyn Sanchez tested positive for COVID-19, she is feeling a lot better, we started her on prednisone early, she is continuing to quarantine.

## 2019-03-14 NOTE — Assessment & Plan Note (Signed)
Had some episodes of substernal chest pain with palpitations on exertion last year. Resolved with rest. We did discuss a nuclear stress test due to her knee arthritis precluding her from doing a treadmill test, she never followed through with this. I am going to add a referral again.

## 2019-03-20 ENCOUNTER — Encounter: Payer: Self-pay | Admitting: Sports Medicine

## 2019-03-21 ENCOUNTER — Other Ambulatory Visit: Payer: Self-pay | Admitting: Sports Medicine

## 2019-03-21 DIAGNOSIS — I10 Essential (primary) hypertension: Secondary | ICD-10-CM

## 2019-03-21 DIAGNOSIS — R69 Illness, unspecified: Secondary | ICD-10-CM | POA: Diagnosis not present

## 2019-03-21 MED ORDER — ALBUTEROL SULFATE (2.5 MG/3ML) 0.083% IN NEBU: 2.5000 mg | INHALATION_SOLUTION | RESPIRATORY_TRACT | 6 refills | Status: DC | PRN

## 2019-03-21 MED ORDER — ALBUTEROL SULFATE HFA 108 (90 BASE) MCG/ACT IN AERS: 2.0000 | INHALATION_SPRAY | Freq: Four times a day (QID) | RESPIRATORY_TRACT | 11 refills | Status: DC | PRN

## 2019-03-21 MED ORDER — NEBULIZER AIR TUBE/PLUGS MISC: 0 refills | Status: DC

## 2019-03-25 DIAGNOSIS — G894 Chronic pain syndrome: Secondary | ICD-10-CM | POA: Diagnosis not present

## 2019-03-25 DIAGNOSIS — M170 Bilateral primary osteoarthritis of knee: Secondary | ICD-10-CM | POA: Diagnosis not present

## 2019-03-25 DIAGNOSIS — M94262 Chondromalacia, left knee: Secondary | ICD-10-CM | POA: Diagnosis not present

## 2019-03-25 DIAGNOSIS — M47816 Spondylosis without myelopathy or radiculopathy, lumbar region: Secondary | ICD-10-CM | POA: Diagnosis not present

## 2019-04-02 ENCOUNTER — Other Ambulatory Visit: Payer: Self-pay | Admitting: *Deleted

## 2019-04-02 DIAGNOSIS — L910 Hypertrophic scar: Secondary | ICD-10-CM | POA: Diagnosis not present

## 2019-04-02 DIAGNOSIS — Z85820 Personal history of malignant melanoma of skin: Secondary | ICD-10-CM | POA: Diagnosis not present

## 2019-04-02 DIAGNOSIS — D2261 Melanocytic nevi of right upper limb, including shoulder: Secondary | ICD-10-CM | POA: Diagnosis not present

## 2019-04-02 DIAGNOSIS — L814 Other melanin hyperpigmentation: Secondary | ICD-10-CM | POA: Diagnosis not present

## 2019-04-02 DIAGNOSIS — Z08 Encounter for follow-up examination after completed treatment for malignant neoplasm: Secondary | ICD-10-CM | POA: Diagnosis not present

## 2019-04-02 MED ORDER — NEBULIZER AIR TUBE/PLUGS MISC: 0 refills | Status: DC

## 2019-04-02 MED ORDER — NEBULIZER AIR TUBE/PLUGS MISC: 0 refills | Status: AC

## 2019-04-16 ENCOUNTER — Other Ambulatory Visit: Payer: Self-pay | Admitting: Sports Medicine

## 2019-04-16 DIAGNOSIS — F314 Bipolar disorder, current episode depressed, severe, without psychotic features: Secondary | ICD-10-CM

## 2019-04-21 ENCOUNTER — Encounter: Payer: Self-pay | Admitting: Sports Medicine

## 2019-04-21 DIAGNOSIS — Z03818 Encounter for observation for suspected exposure to other biological agents ruled out: Secondary | ICD-10-CM | POA: Diagnosis not present

## 2019-04-21 DIAGNOSIS — Z1159 Encounter for screening for other viral diseases: Secondary | ICD-10-CM | POA: Diagnosis not present

## 2019-04-29 ENCOUNTER — Encounter: Payer: Self-pay | Admitting: Sports Medicine

## 2019-04-30 ENCOUNTER — Ambulatory Visit (INDEPENDENT_AMBULATORY_CARE_PROVIDER_SITE_OTHER): Payer: Medicare HMO | Admitting: Sports Medicine

## 2019-04-30 DIAGNOSIS — R0789 Other chest pain: Secondary | ICD-10-CM

## 2019-04-30 MED ORDER — NITROGLYCERIN 0.4 MG SL SUBL: 0.4000 mg | SUBLINGUAL_TABLET | SUBLINGUAL | 0 refills | Status: DC | PRN

## 2019-04-30 MED ORDER — ASPIRIN EC 81 MG PO TBEC: 81.0000 mg | DELAYED_RELEASE_TABLET | Freq: Every day | ORAL | 3 refills | Status: DC

## 2019-04-30 NOTE — Progress Notes (Signed)
Virtual Visit via WebEx/MyChart   I connected with  Evelyn Sanchez  on 04/30/19 via WebEx/MyChart/Doximity Video and verified that I am speaking with the correct person using two identifiers.   I discussed the limitations, risks, security and privacy concerns of performing an evaluation and management service by WebEx/MyChart/Doximity Video, including the higher likelihood of inaccurate diagnosis and treatment, and the availability of in person appointments.  We also discussed the likely need of an additional face to face encounter for complete and high quality delivery of care.  I also discussed with the patient that there may be a patient responsible charge related to this service. The patient expressed understanding and wishes to proceed.  Provider location is either at home or medical facility. Patient location is at their home, different from provider location. People involved in care of the patient during this telehealth encounter were myself, my nurse/medical assistant, and my front office/scheduling team member.  Subjective:    CC: Episodes of chest pain  HPI: Evelyn Sanchez has had chest pains for some time now, initially we had tried to get her a stress test but she did not follow through with it.  She continues to complain of occasional substernal crushing chest pain, worse with exertion, better with rest, not currently experiencing chest pains now.  She gets occasional nausea and diaphoresis, palpitations afterward.  I reviewed the past medical history, family history, social history, surgical history, and allergies today and no changes were needed.  Please see the problem list section below in epic for further details.  Past Medical History: Past Medical History:  Diagnosis Date  . Abnormal pap 1999   colpo  . Anxiety   . Arthritis of both knees 2011  . Chlamydia   . Depression   . GERD (gastroesophageal reflux disease)   . Hypertension   . Melanoma ([place])   . Migraines   .  Ovarian cyst   . Pneumonia   . Trichomonas    Past Surgical History: Past Surgical History:  Procedure Laterality Date  . ABLATION    . APPENDECTOMY  1996  . KNEE ARTHROSCOPY  1991   Left Kneex2  . LYMPH NODE BIOPSY Left 12/11/2017   Procedure: SENTINEL LYMPH NODE MAPPING  BIOPSY;  Surgeon: Stark Klein, MD;  Location: [place];  Service: General;  Laterality: Left;  Marland Kitchen MELANOMA EXCISION Left 12/11/2017   Procedure: EXCISION OF PRIOR SCAR LEFT SHOULDER MELANOMA WITH CLOSURE;  Surgeon: Stark Klein, MD;  Location: [place];  Service: General;  Laterality: Left;  . Tonsilectomy  1984  . TONSILLECTOMY    . TUBAL LIGATION  1999  . TUBOPLASTY / [place]   Four times between 1980-1990   Social History: Social History   Socioeconomic History  . Marital status: Married    Spouse name: Not on file  . Number of children: Not on file  . Years of education: Not on file  . Highest education level: Not on file  Occupational History  . Not on file  Social Needs  . Financial resource strain: Not on file  . Food insecurity    Worry: Not on file    Inability: Not on file  . Transportation needs    Medical: Not on file    Non-medical: Not on file  Tobacco Use  . Smoking status: Former Smoker    Packs/day: 0.50    Years: 10.00    Pack years: 5.00    Types: Cigarettes    Quit date: 11/08/2009  Years since quitting: 9.4  . Smokeless tobacco: Never Used  Substance and Sexual Activity  . Alcohol use: No  . Drug use: No  . Sexual activity: Yes    Partners: Male    Birth control/protection: Other-see comments    Comment: Tubal Ligation  Lifestyle  . Physical activity    Days per week: Not on file    Minutes per session: Not on file  . Stress: Not on file  Relationships  . Social Herbalist on phone: Not on file    Gets together: Not on file    Attends religious service: Not on file    Active member of club or organization: Not on file    Attends  meetings of clubs or organizations: Not on file    Relationship status: Not on file  Other Topics Concern  . Not on file  Social History Narrative  . Not on file   Family History: Family History  Problem Relation Age of Onset  . Hypertension Maternal Grandfather   . Diabetes Maternal Grandfather   . Stroke Maternal Grandfather   . Hypertension Maternal Grandmother   . Cancer Maternal Grandmother   . Hypertension Paternal Grandfather   . Diabetes Paternal Grandfather   . Hypertension Paternal Grandmother   . Hypertension Mother   . Depression Mother   . Diabetes type II Mother   . Diabetes Mother   . Heart attack Father   . Hypertension Father   . Diabetes type II Father   . Diabetes Father   . Hyperlipidemia Father   . Depression Sister   . Diabetes type II Sister   . Diabetes Sister   . Hyperlipidemia Brother   . Cancer Maternal Aunt   . Alcohol abuse Maternal Uncle   . Alcohol abuse Paternal Uncle    Allergies: Allergies  Allergen Reactions  . Ambien [Zolpidem Tartrate] Other (See Comments)    hallucinations   Medications: See med rec.  Review of Systems: No fevers, chills, night sweats, weight loss, chest pain, or shortness of breath.   Objective:    General: Speaking full sentences, no audible heavy breathing.  Sounds alert and appropriately interactive.  Appears well.  Face symmetric.  Extraocular movements intact.  Pupils equal and round.  No nasal flaring or accessory muscle use visualized.  No other physical exam performed due to the non-physical nature of this visit.  Impression and Recommendations:    Chest pain, typical In the recent past we have tried to get her a nuclear stress test, but she did not follow through with it. She continues to have episodes of chest pain, typically occurring with exertion, relieved by rest, with occasional nausea, diaphoresis, palpitations afterward.  Pain is substernal. She is not having any chest pain now, and has not  had it for the past few days. For this reason I think she may be a candidate to proceed straight to catheterization. I am calling in nitroglycerin, aspirin, she is in [place] right now but as soon as she returns she will get labs done at my facility, including CBC, CMP, cardiac enzymes, d-dimer, BNP. I am going to go ahead and order a nuclear stress test but also an urgent referral to cardiology as I do think there is a chance she will go straight to diagnostic catheterization.   I discussed the above assessment and treatment plan with the patient. The patient was provided an opportunity to ask questions and all were answered. The patient  agreed with the plan and demonstrated an understanding of the instructions.   The patient was advised to call back or seek an in-person evaluation if the symptoms worsen or if the condition fails to improve as anticipated.   I provided 25 minutes of non-face-to-face time during this encounter, 15 minutes of additional time was needed to gather information, review chart, records, communicate/coordinate with staff remotely, troubleshooting the multiple errors that we get every time when trying to do video calls through the electronic medical record, WebEx, and Doximity, restart the encounter multiple times due to instability of the software, as well as complete documentation.   ___________________________________________ Gwen Her. Dianah Field, M.D., ABFM., CAQSM. Primary Care and Sports Medicine [facility]  Adjunct Professor of [place] [employer] [facility] of Medicine

## 2019-04-30 NOTE — Assessment & Plan Note (Signed)
In the recent past we have tried to get her a nuclear stress test, but she did not follow through with it. She continues to have episodes of chest pain, typically occurring with exertion, relieved by rest, with occasional nausea, diaphoresis, palpitations afterward.  Pain is substernal. She is not having any chest pain now, and has not had it for the past few days. For this reason I think she may be a candidate to proceed straight to catheterization. I am calling in nitroglycerin, aspirin, she is in [place] right now but as soon as she returns she will get labs done at my facility, including CBC, CMP, cardiac enzymes, d-dimer, BNP. I am going to go ahead and order a nuclear stress test but also an urgent referral to cardiology as I do think there is a chance she will go straight to diagnostic catheterization.

## 2019-05-05 ENCOUNTER — Encounter: Payer: Self-pay | Admitting: Sports Medicine

## 2019-05-06 DIAGNOSIS — R0789 Other chest pain: Secondary | ICD-10-CM | POA: Diagnosis not present

## 2019-05-06 LAB — COMPLETE METABOLIC PANEL WITH GFR
AG Ratio: 2 (calc) (ref 1.0–2.5)
ALT: 13 U/L (ref 6–29)
AST: 18 U/L (ref 10–30)
Albumin: 4.3 g/dL (ref 3.6–5.1)
Alkaline phosphatase (APISO): 63 U/L (ref 31–125)
BUN: 24 mg/dL (ref 7–25)
CO2: 32 mmol/L (ref 20–32)
Calcium: 9.5 mg/dL (ref 8.6–10.2)
Chloride: 107 mmol/L (ref 98–110)
Creat: 0.81 mg/dL (ref 0.50–1.10)
GFR, Est African American: 102 mL/min/{1.73_m2} (ref >=60)
GFR, Est Non African American: 88 mL/min/{1.73_m2} (ref >=60)
Globulin: 2.2 g/dL (calc) (ref 1.9–3.7)
Glucose, Bld: 72 mg/dL (ref 65–99)
Potassium: 4.5 mmol/L (ref 3.5–5.3)
Sodium: 140 mmol/L (ref 135–146)
Total Bilirubin: 0.4 mg/dL (ref 0.2–1.2)
Total Protein: 6.5 g/dL (ref 6.1–8.1)

## 2019-05-06 LAB — D-DIMER, QUANTITATIVE: D-Dimer, Quant: 0.25 mcg/mL FEU (ref <0.50)

## 2019-05-06 LAB — CK TOTAL AND CKMB (NOT AT ARMC)
CK, MB: 3.8 ng/mL (ref 0–5.0)
Relative Index: 2.1 (ref 0–4.0)
Total CK: 181 U/L — ABNORMAL HIGH (ref 29–143)

## 2019-05-06 LAB — CBC
HCT: 41.1 % (ref 35.0–45.0)
Hemoglobin: 13.5 g/dL (ref 11.7–15.5)
MCH: 28.1 pg (ref 27.0–33.0)
MCHC: 32.8 g/dL (ref 32.0–36.0)
MCV: 85.4 fL (ref 80.0–100.0)
MPV: 10.3 fL (ref 7.5–12.5)
Platelets: 275 10*3/uL (ref 140–400)
RBC: 4.81 10*6/uL (ref 3.80–5.10)
RDW: 14 % (ref 11.0–15.0)
WBC: 6.6 10*3/uL (ref 3.8–10.8)

## 2019-05-06 LAB — TROPONIN I: Troponin I: <0.01 ng/mL (ref <=0.0)

## 2019-05-06 LAB — BRAIN NATRIURETIC PEPTIDE: Brain Natriuretic Peptide: 116 pg/mL — ABNORMAL HIGH (ref <100)

## 2019-05-13 ENCOUNTER — Other Ambulatory Visit: Payer: Self-pay | Admitting: Sports Medicine

## 2019-05-13 ENCOUNTER — Other Ambulatory Visit: Payer: Self-pay | Admitting: *Deleted

## 2019-05-13 ENCOUNTER — Telehealth (HOSPITAL_COMMUNITY): Payer: Self-pay | Admitting: *Deleted

## 2019-05-13 DIAGNOSIS — G43909 Migraine, unspecified, not intractable, without status migrainosus: Secondary | ICD-10-CM

## 2019-05-13 NOTE — Telephone Encounter (Signed)
Patient given detailed instructions per Myocardial Perfusion Study Information Sheet for the test on 05/15/19 at 10:45. Patient notified to arrive 15 minutes early and that it is imperative to arrive on time for appointment to keep from having the test rescheduled.  If you need to cancel or reschedule your appointment, please call the office within 24 hours of your appointment. . Patient verbalized understanding.Evelyn Sanchez

## 2019-05-15 ENCOUNTER — Other Ambulatory Visit: Payer: Self-pay

## 2019-05-15 ENCOUNTER — Ambulatory Visit (HOSPITAL_COMMUNITY): Payer: Medicare HMO | Attending: Cardiology

## 2019-05-15 ENCOUNTER — Encounter (HOSPITAL_COMMUNITY): Payer: Self-pay

## 2019-05-15 VITALS — Ht 64.0 in | Wt 184.0 lb

## 2019-05-15 DIAGNOSIS — R0789 Other chest pain: Secondary | ICD-10-CM | POA: Diagnosis not present

## 2019-05-15 LAB — MYOCARDIAL PERFUSION IMAGING
LV dias vol: 59 mL (ref 46–106)
LV sys vol: 22 mL
Peak HR: 94 {beats}/min
Rest HR: 64 {beats}/min
SDS: 0
SRS: 0
SSS: 0
TID: 0.99

## 2019-05-15 MED ORDER — TECHNETIUM TC 99M TETROFOSMIN IV KIT
11.0000 | PACK | Freq: Once | INTRAVENOUS | Status: AC | PRN
  Administered 2019-05-15: 11 via INTRAVENOUS
  Filled 2019-05-15: qty 11

## 2019-05-15 MED ORDER — TECHNETIUM TC 99M TETROFOSMIN IV KIT
32.4000 | PACK | Freq: Once | INTRAVENOUS | Status: AC | PRN
  Administered 2019-05-15: 32.4 via INTRAVENOUS
  Filled 2019-05-15: qty 33

## 2019-05-15 MED ORDER — REGADENOSON 0.4 MG/5ML IV SOLN
0.4000 mg | Freq: Once | INTRAVENOUS | Status: AC
  Administered 2019-05-15: 0.4 mg via INTRAVENOUS

## 2019-05-16 NOTE — Progress Notes (Signed)
Please let patient know Dr. Darene Lamer is out of the office today and will likely comment and let you know next steps as well but impression of myocardial perfusion study looked normal. No sign of ischemia. EF is normal.

## 2019-05-18 ENCOUNTER — Other Ambulatory Visit: Payer: Self-pay | Admitting: Sports Medicine

## 2019-05-18 DIAGNOSIS — R0789 Other chest pain: Secondary | ICD-10-CM

## 2019-05-21 DIAGNOSIS — R69 Illness, unspecified: Secondary | ICD-10-CM | POA: Diagnosis not present

## 2019-05-28 ENCOUNTER — Other Ambulatory Visit: Payer: Self-pay | Admitting: Sports Medicine

## 2019-05-28 DIAGNOSIS — F331 Major depressive disorder, recurrent, moderate: Secondary | ICD-10-CM

## 2019-06-02 ENCOUNTER — Encounter: Payer: Self-pay | Admitting: Sports Medicine

## 2019-06-10 ENCOUNTER — Other Ambulatory Visit: Payer: Self-pay

## 2019-06-10 ENCOUNTER — Encounter: Payer: Self-pay | Admitting: Sports Medicine

## 2019-06-10 ENCOUNTER — Ambulatory Visit (INDEPENDENT_AMBULATORY_CARE_PROVIDER_SITE_OTHER): Payer: Medicare HMO | Admitting: Sports Medicine

## 2019-06-10 DIAGNOSIS — L659 Nonscarring hair loss, unspecified: Secondary | ICD-10-CM | POA: Insufficient documentation

## 2019-06-10 DIAGNOSIS — F314 Bipolar disorder, current episode depressed, severe, without psychotic features: Secondary | ICD-10-CM | POA: Diagnosis not present

## 2019-06-10 DIAGNOSIS — R69 Illness, unspecified: Secondary | ICD-10-CM | POA: Diagnosis not present

## 2019-06-10 HISTORY — DX: Nonscarring hair loss, unspecified: L65.9

## 2019-06-10 MED ORDER — BUSPIRONE HCL 5 MG PO TABS: 5.0000 mg | ORAL_TABLET | Freq: Three times a day (TID) | ORAL | 3 refills | Status: DC

## 2019-06-10 MED ORDER — ALPRAZOLAM 0.5 MG PO TABS: 0.5000 mg | ORAL_TABLET | Freq: Three times a day (TID) | ORAL | 0 refills | Status: DC | PRN

## 2019-06-10 NOTE — Assessment & Plan Note (Signed)
Previously diagnosed bipolar 1 disorder with rare episodes of mania, the most recent of which was approximately 9 months ago. Overall she is well controlled on Cymbalta, she does have significant anxiety, recently worsened with multiple family members moving back to the house and the passing of her father. I am going to refill her alprazolam, but also add BuSpar. We can do a virtual visit in 3 weeks to reevaluate. We are to hold off on adding a mood stabilizer for now, of note she did try Lamictal with a poor response. She does work with a Scientist, water quality, [name] in [place].

## 2019-06-10 NOTE — Progress Notes (Signed)
Subjective:    CC: Anxiety and hair loss  HPI: Evelyn Sanchez is a pleasant 43 year old female with a history of bipolar 1 disorder, not currently manic, last episode of mania was sometime last year.  She has opted to avoid mood stabilizers for the time being and symptoms have been relatively well-controlled with Cymbalta at 120 mg daily as well as an occasional alprazolam.  More recently she is had some significant stressors, multiple family members, her children have moved back into her house, and her father passed away.  She has increasing anxiety, tearfulness, panic without suicidal or homicidal ideation.  In addition she is noted some increasing hair loss, it comes out in clumps when she washes her hair and brushes it.  No constitutional symptoms, no head trauma.  No new medications.  I reviewed the past medical history, family history, social history, surgical history, and allergies today and no changes were needed.  Please see the problem list section below in epic for further details.  Past Medical History: Past Medical History:  Diagnosis Date  . Abnormal pap 1999   colpo  . Anxiety   . Arthritis of both knees 2011  . Chlamydia   . Depression   . GERD (gastroesophageal reflux disease)   . Hypertension   . Melanoma ([place])   . Migraines   . Ovarian cyst   . Pneumonia   . Trichomonas    Past Surgical History: Past Surgical History:  Procedure Laterality Date  . ABLATION    . APPENDECTOMY  1996  . KNEE ARTHROSCOPY  1991   Left Kneex2  . LYMPH NODE BIOPSY Left 12/11/2017   Procedure: SENTINEL LYMPH NODE MAPPING  BIOPSY;  Surgeon: Stark Klein, MD;  Location: [place];  Service: General;  Laterality: Left;  Marland Kitchen MELANOMA EXCISION Left 12/11/2017   Procedure: EXCISION OF PRIOR SCAR LEFT SHOULDER MELANOMA WITH CLOSURE;  Surgeon: Stark Klein, MD;  Location: [place];  Service: General;  Laterality: Left;  . Tonsilectomy  1984  . TONSILLECTOMY    . TUBAL LIGATION  1999  . TUBOPLASTY /  [place]   Four times between 1980-1990   Social History: Social History   Socioeconomic History  . Marital status: Married    Spouse name: Not on file  . Number of children: Not on file  . Years of education: Not on file  . Highest education level: Not on file  Occupational History  . Not on file  Social Needs  . Financial resource strain: Not on file  . Food insecurity    Worry: Not on file    Inability: Not on file  . Transportation needs    Medical: Not on file    Non-medical: Not on file  Tobacco Use  . Smoking status: Former Smoker    Packs/day: 0.50    Years: 10.00    Pack years: 5.00    Types: Cigarettes    Quit date: 11/08/2009    Years since quitting: 9.5  . Smokeless tobacco: Never Used  Substance and Sexual Activity  . Alcohol use: No  . Drug use: No  . Sexual activity: Yes    Partners: Male    Birth control/protection: Other-see comments    Comment: Tubal Ligation  Lifestyle  . Physical activity    Days per week: Not on file    Minutes per session: Not on file  . Stress: Not on file  Relationships  . Social connections    Talks on phone: Not on  file    Gets together: Not on file    Attends religious service: Not on file    Active member of club or organization: Not on file    Attends meetings of clubs or organizations: Not on file    Relationship status: Not on file  Other Topics Concern  . Not on file  Social History Narrative  . Not on file   Family History: Family History  Problem Relation Age of Onset  . Hypertension Maternal Grandfather   . Diabetes Maternal Grandfather   . Stroke Maternal Grandfather   . Hypertension Maternal Grandmother   . Cancer Maternal Grandmother   . Hypertension Paternal Grandfather   . Diabetes Paternal Grandfather   . Hypertension Paternal Grandmother   . Hypertension Mother   . Depression Mother   . Diabetes type II Mother   . Diabetes Mother   . Heart attack Father   . Hypertension  Father   . Diabetes type II Father   . Diabetes Father   . Hyperlipidemia Father   . Depression Sister   . Diabetes type II Sister   . Diabetes Sister   . Hyperlipidemia Brother   . Cancer Maternal Aunt   . Alcohol abuse Maternal Uncle   . Alcohol abuse Paternal Uncle    Allergies: Allergies  Allergen Reactions  . Ambien [Zolpidem Tartrate] Other (See Comments)    hallucinations   Medications: See med rec.  Review of Systems: No fevers, chills, night sweats, weight loss, chest pain, or shortness of breath.   Objective:    General: Well Developed, well nourished, and in no acute distress.  Neuro: Alert and oriented x3, extra-ocular muscles intact, sensation grossly intact.  HEENT: Normocephalic, atraumatic, pupils equal round reactive to light, neck supple, no masses, no lymphadenopathy, thyroid nonpalpable.  Negative hair pull test with 5 strands. Skin: Warm and dry, no rashes. Cardiac: Regular rate and rhythm, no murmurs rubs or gallops, no lower extremity edema.  Respiratory: Clear to auscultation bilaterally. Not using accessory muscles, speaking in full sentences.  Impression and Recommendations:    Bipolar I disorder, severe, current or most recent episode depressed, with rapid cycling ([place]) Previously diagnosed bipolar 1 disorder with rare episodes of mania, the most recent of which was approximately 9 months ago. Overall she is well controlled on Cymbalta, she does have significant anxiety, recently worsened with multiple family members moving back to the house and the passing of her father. I am going to refill her alprazolam, but also add BuSpar. We can do a virtual visit in 3 weeks to reevaluate. We are to hold off on adding a mood stabilizer for now, of note she did try Lamictal with a poor response. She does work with a Scientist, water quality, [name] in [place].  Hair loss Hair loss, less than 6 hairs on the pull test. This is likely mild telogen  effluvium. We can just watch this for now.   ___________________________________________ Gwen Her. Dianah Field, M.D., ABFM., CAQSM. Primary Care and Sports Medicine [facility]  Adjunct Professor of [place] [employer] [facility] [employer] of Medicine

## 2019-06-10 NOTE — Assessment & Plan Note (Signed)
Hair loss, less than 6 hairs on the pull test. This is likely mild telogen effluvium. We can just watch this for now.

## 2019-06-10 NOTE — Patient Instructions (Signed)
mild telogen effluvium

## 2019-06-17 ENCOUNTER — Ambulatory Visit (INDEPENDENT_AMBULATORY_CARE_PROVIDER_SITE_OTHER): Payer: Medicare HMO | Admitting: Certified Nurse Midwife

## 2019-06-17 ENCOUNTER — Encounter: Payer: Self-pay | Admitting: Certified Nurse Midwife

## 2019-06-17 ENCOUNTER — Other Ambulatory Visit: Payer: Self-pay

## 2019-06-17 ENCOUNTER — Other Ambulatory Visit (HOSPITAL_COMMUNITY)
Admission: RE | Admit: 2019-06-17 | Discharge: 2019-06-17 | Disposition: A | Discharge status: Home / Self Care | Payer: Medicare HMO | Source: Ambulatory Visit | Attending: Certified Nurse Midwife | Admitting: Certified Nurse Midwife

## 2019-06-17 VITALS — BP 121/67 | HR 79 | Resp 16 | Ht 64.0 in | Wt 187.0 lb

## 2019-06-17 DIAGNOSIS — Z01419 Encounter for gynecological examination (general) (routine) without abnormal findings: Secondary | ICD-10-CM | POA: Insufficient documentation

## 2019-06-17 NOTE — Progress Notes (Signed)
GYNECOLOGY ANNUAL PREVENTATIVE CARE ENCOUNTER NOTE  History:     Evelyn Sanchez is a 43 y.o. [phone] female here for a routine annual gynecologic exam. Current complaints: vaginal dryness and pain with intercourse.   Denies abnormal vaginal bleeding, discharge, pelvic pain, problems with intercourse or other gynecologic concerns.    Gynecologic History No LMP recorded. Patient has had an ablation. Contraception: none Last Pap: 09/20/2015. Results were: normal with negative HPV Last mammogram: never had one  Obstetric History OB History  Gravida Para Term Preterm AB Living  4 2 2   2 2   SAB TAB Ectopic Multiple Live Births  2            # Outcome Date GA Lbr Len/2nd Weight Sex Delivery Anes PTL Lv  4 SAB           3 SAB           2 Term      Vag-Spont     1 Term      Vag-Spont       Past Medical History:  Diagnosis Date  . Abnormal pap 1999   colpo  . Anxiety   . Arthritis of both knees 2011  . Chlamydia   . Depression   . GERD (gastroesophageal reflux disease)   . Hypertension   . Melanoma ([place])   . Migraines   . Ovarian cyst   . Pneumonia   . Trichomonas   . Vaginal Pap smear, abnormal     Past Surgical History:  Procedure Laterality Date  . ABLATION    . APPENDECTOMY  1996  . KNEE ARTHROSCOPY  1991   Left Kneex2  . LYMPH NODE BIOPSY Left 12/11/2017   Procedure: SENTINEL LYMPH NODE MAPPING  BIOPSY;  Surgeon: Stark Klein, MD;  Location: [facility] [place];  Service: General;  Laterality: Left;  Marland Kitchen MELANOMA EXCISION Left 12/11/2017   Procedure: EXCISION OF PRIOR SCAR LEFT SHOULDER MELANOMA WITH CLOSURE;  Surgeon: Stark Klein, MD;  Location: [facility] [facility];  Service: General;  Laterality: Left;  . Tonsilectomy  1984  . TONSILLECTOMY    . TUBAL LIGATION  1999  . TUBAL LIGATION    . TUBOPLASTY / [place]   Four times between 1980-1990    Current Outpatient Medications on File Prior to Visit  Medication Sig Dispense Refill  . acetaminophen (TYLENOL) 650 MG CR  tablet Take 2 tablets (1,300 mg total) by mouth every 8 (eight) hours as needed for pain. 90 tablet 3  . albuterol (PROVENTIL) (2.5 MG/3ML) 0.083% nebulizer solution Take 3 mLs (2.5 mg total) by nebulization every 4 (four) hours as needed for wheezing or shortness of breath (please include nebulizer machine, hoses, and mask if needed.). 30 vial 6  . albuterol (VENTOLIN HFA) 108 (90 Base) MCG/ACT inhaler Inhale 2 puffs into the lungs every 6 (six) hours as needed for wheezing. 2 Inhaler 11  . ALPRAZolam (XANAX) 0.5 MG tablet Take 1 tablet (0.5 mg total) by mouth 3 (three) times daily as needed for anxiety. 20 tablet 0  . aspirin EC 81 MG tablet Take 1 tablet (81 mg total) by mouth daily. 30 tablet 3  . baclofen (LIORESAL) 10 MG tablet     . busPIRone (BUSPAR) 5 MG tablet Take 1 tablet (5 mg total) by mouth 3 (three) times daily. 90 tablet 3  . DULoxetine (CYMBALTA) 60 MG capsule TAKE 2 CAPSULES BY MOUTH EVERY DAY 180 capsule 2  . lisinopril-hydrochlorothiazide (ZESTORETIC) 20-25 MG  tablet TAKE 1/2 OF A TABLET BY MOUTH EVERY DAY 15 tablet 5  . Menthol, Topical Analgesic, (BENGAY EX) Apply 1 application topically 2 (two) times daily as needed (for pain).    . nitroGLYCERIN (NITROSTAT) 0.4 MG SL tablet PLEASE SEE ATTACHED FOR DETAILED DIRECTIONS 25 tablet 0  . ondansetron (ZOFRAN-ODT) 8 MG disintegrating tablet Take 1 tablet (8 mg total) by mouth every 8 (eight) hours as needed for nausea. 20 tablet 3  . Respiratory Therapy Supplies (NEBULIZER AIR TUBE/PLUGS) MISC Mask and tubing for use as needed 1 each 0  . rizatriptan (MAXALT) 10 MG tablet TAKE 1 TABLET (10 MG TOTAL) BY MOUTH AS NEEDED FOR MIGRAINE. MAY REPEAT IN 2 HOURS IF NEEDED 10 tablet 5  . topiramate (TOPAMAX) 50 MG tablet Take 1 tablet (50 mg total) by mouth 2 (two) times daily. TAKE 1/2 TAB BY MOUTH NIGHTLY FOR 1 WEEK,1 TAB NIGHTLY FOR 1 WEEK,1 TAB TWICE DAILY 60 tablet 3  . Multiple Vitamins-Minerals (MULTIVITAMIN PO) Take 1 tablet by mouth  daily.     No current facility-administered medications on file prior to visit.     Allergies  Allergen Reactions  . Ambien [Zolpidem Tartrate] Other (See Comments)    hallucinations    Social History:  reports that she quit smoking about 9 years ago. Her smoking use included cigarettes. She has a 5.00 pack-year smoking history. She has never used smokeless tobacco. She reports that she does not drink alcohol or use drugs.  Family History  Problem Relation Age of Onset  . Hypertension Maternal Grandfather   . Diabetes Maternal Grandfather   . Stroke Maternal Grandfather   . Hypertension Maternal Grandmother   . Cancer Maternal Grandmother   . Hypertension Paternal Grandfather   . Diabetes Paternal Grandfather   . Hypertension Paternal Grandmother   . Hypertension Mother   . Depression Mother   . Diabetes type II Mother   . Diabetes Mother   . Heart attack Father   . Hypertension Father   . Diabetes type II Father   . Diabetes Father   . Hyperlipidemia Father   . Depression Sister   . Diabetes type II Sister   . Diabetes Sister   . Hyperlipidemia Brother   . Cancer Maternal Aunt   . Alcohol abuse Maternal Uncle   . Alcohol abuse Paternal Uncle     The following portions of the patient's history were reviewed and updated as appropriate: allergies, current medications, past family history, past medical history, past social history, past surgical history and problem list.  Review of Systems Pertinent items noted in HPI and remainder of comprehensive ROS otherwise negative.  Physical Exam:  BP 121/67   Pulse 79   Resp 16   Ht 5' 4"  (1.626 m)   Wt 84.8 kg   BMI 32.10 kg/m  CONSTITUTIONAL: Well-developed, well-nourished female in no acute distress.  HENT:  Normocephalic, atraumatic, External right and left ear normal. Oropharynx is clear and moist EYES: Conjunctivae and EOM are normal. Pupils are equal, round, and reactive to light. No scleral icterus.  NECK: Normal  range of motion, supple, no masses.  Normal thyroid.  SKIN: Skin is warm and dry. No rash noted. Not diaphoretic. No erythema. No pallor. MUSCULOSKELETAL: Normal range of motion. No tenderness.  No cyanosis, clubbing, or edema.  2+ distal pulses. NEUROLOGIC: Alert and oriented to person, place, and time. Normal reflexes, muscle tone coordination. No cranial nerve deficit noted. PSYCHIATRIC: Normal mood and affect. Normal behavior. Normal  judgment and thought content. CARDIOVASCULAR: Normal heart rate noted, regular rhythm RESPIRATORY: Clear to auscultation bilaterally. Effort and breath sounds normal, no problems with respiration noted. BREASTS: Symmetric in size. No masses, skin changes, nipple drainage, or lymphadenopathy. ABDOMEN: Soft, normal bowel sounds, no distention noted.  No tenderness, rebound or guarding.  PELVIC: Normal appearing external genitalia without lesions, masses, irritation or excoriaiton; vaginal mucosa pink and moist with rugae; cervix pink without lesions or masses. No abnormal discharge noted. Pap smear obtained.  Normal uterine size, no other palpable masses, no uterine or adnexal tenderness.   Assessment and Plan:    1. Women's annual routine gynecological examination -Pt here for well woman exam and pap -Pt c/o vaginal dryness and pain with intercourse -Encouraged the use of vaginal lubricants with sexual intercourse and vaginal moisturizers for daily use -Mammogram scheduled  - Cytology - PAP( [place]) - MM Digital Screening; Future  Will follow up results of pap smear and manage accordingly. Mammogram scheduled Routine preventative health maintenance measures emphasized. Please refer to After Visit Summary for other counseling recommendations.     Follow up in 1 year for well woman exam or as needed

## 2019-06-18 MED ORDER — VALACYCLOVIR HCL 1 G PO TABS: 1000.0000 mg | ORAL_TABLET | Freq: Every day | ORAL | 2 refills | Status: DC

## 2019-06-18 NOTE — Addendum Note (Signed)
Addended by: Lajean Manes on: 06/18/2019 09:50 AM   Modules accepted: Orders

## 2019-06-19 LAB — CYTOLOGY - PAP
Diagnosis: NEGATIVE
HPV: NOT DETECTED

## 2019-06-25 DIAGNOSIS — M170 Bilateral primary osteoarthritis of knee: Secondary | ICD-10-CM | POA: Diagnosis not present

## 2019-06-25 DIAGNOSIS — M47816 Spondylosis without myelopathy or radiculopathy, lumbar region: Secondary | ICD-10-CM | POA: Diagnosis not present

## 2019-06-25 DIAGNOSIS — G894 Chronic pain syndrome: Secondary | ICD-10-CM | POA: Diagnosis not present

## 2019-06-25 DIAGNOSIS — Z79891 Long term (current) use of opiate analgesic: Secondary | ICD-10-CM | POA: Diagnosis not present

## 2019-06-25 DIAGNOSIS — M94262 Chondromalacia, left knee: Secondary | ICD-10-CM | POA: Diagnosis not present

## 2019-06-25 NOTE — Progress Notes (Signed)
Cardiology Office Note:    Date:  06/26/2019   ID:  Evelyn Sanchez, DOB 02-Apr-1976, MRN [id number]  PCP:  Silverio Decamp, MD  Cardiologist:  Neela Zecca  Electrophysiologist:  None   Referring MD: Silverio Decamp,*   Chief Complaint  Patient presents with   Palpitations     History of Present Illness:   Sept. 10, 2020    Evelyn Sanchez is a 43 y.o. female with a hx of HTN and palpitations We were asked to see him by Silverio Decamp, MD for further evaluation of these palpitations  Has had them for several months . Randomly , occurs when she is active - working in garden , Social research officer, government.  Also has some chest pressure when she is exerting . Associated with dyspnea,  Occasional dizziness. + diaphoresis   + family hx of CAD - father passed away with cardiac amyolidosis .  Father had first MI at age 82  Has just started walking   Had Vocid 72 in April  - has been more short of breath since then   Past Medical History:  Diagnosis Date   Abnormal pap 1999   colpo   Abnormal uterine bleeding 05/02/2012   Anxiety    Arthritis of both knees 2011   Benign essential hypertension 10/22/2017   Bilateral hand numbness 03/02/2016   Bipolar I disorder, severe, current or most recent episode depressed, with rapid cycling ([place]) 06/11/2014   07/05/2018 PHQ 9 = 20, GAD7 = 15 08/02/2018 PHQ 9 = 14, GAD7 = 18 09/02/2018 PHQ 9 = 7, GAD7 = 7 09/30/2018 PHQ 9 = 8, GAD 7 = 8 06/10/2019 PHQ 9 = 8, GAD 7 = 16   Chest pain, typical 09/30/2018   Chlamydia    Chronic pain syndrome 04/06/2016   Depression    Family history of ovarian cancer 05/02/2012   Femoral neuropathy of left lower extremity 11/26/2014   NCV/EMG: Chronic neurogenic changes to left vastus lateralis, could be old L4 radiculopathy, femoral neuropathy, focal muscle damage.  No other evidence of radiculopathy.    Fracture of coronoid process of ulna, left, closed 11/12/2014   Ganglion cyst of flexor tendon sheath of finger,  right 07/05/2018   Ganglion cyst of wrist 01/01/2014   GERD (gastroesophageal reflux disease)    Hair loss 06/10/2019   Hypertension    Lateral epicondylitis of right elbow 11/15/2016   Lumbar facet arthropathy 03/08/2017   Malignant melanoma ([place]) 11/19/2017   Melanoma ([place])    Migraine without status migrainosus, not intractable 03/19/2014   Overview:  Last Assessment & Plan:  Per patient request referral to neurology.   Migraines    Obesity 02/03/2014   Ovarian cyst    Periapical abscess 01/08/2019   Pneumonia    Post traumatic stress disorder 03/09/2012   Premenstrual tension syndrome 02/10/2014   Overview:  Overview:  OCPs started 02/10/14   Primary osteoarthritis of left knee 01/01/2014   Severe acute respiratory syndrome coronavirus 2 (SARS-CoV-2) detected 10/14/2018   Shivering 03/05/2019   Trichomonas    Vaginal Pap smear, abnormal    Weakness of both legs 12/17/2012    Past Surgical History:  Procedure Laterality Date   ABLATION     APPENDECTOMY  1996   KNEE ARTHROSCOPY  1991   Left Kneex2   LYMPH NODE BIOPSY Left 12/11/2017   Procedure: SENTINEL LYMPH NODE MAPPING  BIOPSY;  Surgeon: Stark Klein, MD;  Location: [facility];  Service: General;  Laterality: Left;   MELANOMA EXCISION  Left 12/11/2017   Procedure: EXCISION OF PRIOR SCAR LEFT SHOULDER MELANOMA WITH CLOSURE;  Surgeon: Stark Klein, MD;  Location: [facility];  Service: General;  Laterality: Left;   Tonsilectomy  1984   TONSILLECTOMY     TUBAL LIGATION  1999   TUBAL LIGATION     TUBOPLASTY / TUBOTUBAL ANASTOMOSIS  1980   Four times between 1980-1990    Current Medications: Current Meds  Medication Sig   acetaminophen (TYLENOL) 650 MG CR tablet Take 2 tablets (1,300 mg total) by mouth every 8 (eight) hours as needed for pain.   albuterol (PROVENTIL) (2.5 MG/3ML) 0.083% nebulizer solution Take 3 mLs (2.5 mg total) by nebulization every 4 (four) hours as needed for wheezing or shortness of breath  (please include nebulizer machine, hoses, and mask if needed.).   albuterol (VENTOLIN HFA) 108 (90 Base) MCG/ACT inhaler Inhale 2 puffs into the lungs every 6 (six) hours as needed for wheezing.   ALPRAZolam (XANAX) 0.5 MG tablet Take 1 tablet (0.5 mg total) by mouth 3 (three) times daily as needed for anxiety.   aspirin EC 81 MG tablet Take 1 tablet (81 mg total) by mouth daily.   baclofen (LIORESAL) 10 MG tablet    busPIRone (BUSPAR) 5 MG tablet Take 1 tablet (5 mg total) by mouth 3 (three) times daily.   DULoxetine (CYMBALTA) 60 MG capsule TAKE 2 CAPSULES BY MOUTH EVERY DAY   HYDROcodone-acetaminophen (NORCO) 10-325 MG tablet Take 1 tablet by mouth as needed.   lisinopril-hydrochlorothiazide (ZESTORETIC) 20-25 MG tablet TAKE 1/2 OF A TABLET BY MOUTH EVERY DAY   Menthol, Topical Analgesic, (BENGAY EX) Apply 1 application topically 2 (two) times daily as needed (for pain).   Multiple Vitamins-Minerals (MULTIVITAMIN PO) Take 1 tablet by mouth daily.   nitroGLYCERIN (NITROSTAT) 0.4 MG SL tablet PLEASE SEE ATTACHED FOR DETAILED DIRECTIONS   ondansetron (ZOFRAN-ODT) 8 MG disintegrating tablet Take 1 tablet (8 mg total) by mouth every 8 (eight) hours as needed for nausea.   Respiratory Therapy Supplies (NEBULIZER AIR TUBE/PLUGS) MISC Mask and tubing for use as needed   rizatriptan (MAXALT) 10 MG tablet TAKE 1 TABLET (10 MG TOTAL) BY MOUTH AS NEEDED FOR MIGRAINE. MAY REPEAT IN 2 HOURS IF NEEDED   topiramate (TOPAMAX) 50 MG tablet Take 1 tablet (50 mg total) by mouth 2 (two) times daily. TAKE 1/2 TAB BY MOUTH NIGHTLY FOR 1 WEEK,1 TAB NIGHTLY FOR 1 WEEK,1 TAB TWICE DAILY   valACYclovir (VALTREX) 1000 MG tablet Take 1 tablet (1,000 mg total) by mouth daily. Take for 5 days     Allergies:   Ambien [zolpidem tartrate]   Social History   Socioeconomic History   Marital status: Married    Spouse name: Not on file   Number of children: Not on file   Years of education: Not on file     Highest education level: Not on file  Occupational History   Not on file  Social Needs   Financial resource strain: Not on file   Food insecurity    Worry: Not on file    Inability: Not on file   Transportation needs    Medical: Not on file    Non-medical: Not on file  Tobacco Use   Smoking status: Former Smoker    Packs/day: 0.50    Years: 10.00    Pack years: 5.00    Types: Cigarettes    Quit date: 11/08/2009    Years since quitting: 9.6   Smokeless tobacco: Never Used  Substance and Sexual Activity   Alcohol use: No   Drug use: No   Sexual activity: Yes    Partners: Male    Birth control/protection: Other-see comments    Comment: Tubal Ligation  Lifestyle   Physical activity    Days per week: Not on file    Minutes per session: Not on file   Stress: Not on file  Relationships   Social connections    Talks on phone: Not on file    Gets together: Not on file    Attends religious service: Not on file    Active member of club or organization: Not on file    Attends meetings of clubs or organizations: Not on file    Relationship status: Not on file  Other Topics Concern   Not on file  Social History Narrative   Not on file     Family History: The patient's family history includes Alcohol abuse in her maternal uncle and paternal uncle; Cancer in her maternal aunt and maternal grandmother; Depression in her mother and sister; Diabetes in her father, maternal grandfather, mother, paternal grandfather, and sister; Diabetes type II in her father, mother, and sister; Heart attack in her father; Hyperlipidemia in her brother and father; Hypertension in her father, maternal grandfather, maternal grandmother, mother, paternal grandfather, and paternal grandmother; Stroke in her maternal grandfather.  ROS:   Please see the history of present illness.     All other systems reviewed and are negative.  EKGs/Labs/Other Studies Reviewed:    The following studies  were reviewed today:   EKG:   Sept. 10, 2020.  NSR at 68.  Low voltate   Recent Labs: 05/06/2019: ALT 13; Brain Natriuretic Peptide 116; BUN 24; Creat 0.81; Hemoglobin 13.5; Platelets 275; Potassium 4.5; Sodium 140  Recent Lipid Panel    Component Value Date/Time   CHOL 187 01/23/2014 1129   TRIG 192 (H) 01/23/2014 1129   HDL 51 01/23/2014 1129   CHOLHDL 3.7 01/23/2014 1129   VLDL 38 01/23/2014 1129   LDLCALC 98 01/23/2014 1129    Physical Exam:    VS:  BP 108/72    Pulse 68    Ht 5' 4"  (1.626 m)    Wt 191 lb 1.9 oz (86.7 kg)    SpO2 98%    BMI 32.81 kg/m     Wt Readings from Last 3 Encounters:  06/26/19 191 lb 1.9 oz (86.7 kg)  06/17/19 187 lb (84.8 kg)  06/10/19 189 lb (85.7 kg)     GEN:  Young female, NAD  HEENT: Normal NECK: No JVD; No carotid bruits LYMPHATICS: No lymphadenopathy CARDIAC: RRR, no murmurs, rubs, gallops RESPIRATORY:  Clear to auscultation without rales, wheezing or rhonchi  ABDOMEN: Soft, non-tender, non-distended MUSCULOSKELETAL:  No edema; No deformity  SKIN: Warm and dry NEUROLOGIC:  Alert and oriented x 3 PSYCHIATRIC:  Normal affect   ASSESSMENT:    1. Palpitations   2. Chest pain, unspecified type   3. Screening for lipid disorders    PLAN:    In order of problems listed above:  1. Family Hx of Amyloidosis:   Father passed away of cardiac amyloidosis.   Was told that family members should be screened  2.  Palpitations:   Has been having palpitations for the past several months .  We will place a 30 day event monitor   3.  Chest pressure:   Has exertional chest pressure.   Symptoms are worrisome for CAd.   Will  get a coronary CT  angiogram .   Will see her in 3 months .       Medication Adjustments/Labs and Tests Ordered: Current medicines are reviewed at length with the patient today.  Concerns regarding medicines are outlined above.  Orders Placed This Encounter  Procedures   CT CORONARY MORPH W/CTA COR W/SCORE W/CA W/CM &/OR  WO/CM   CT CORONARY FRACTIONAL FLOW RESERVE DATA PREP   CT CORONARY FRACTIONAL FLOW RESERVE FLUID ANALYSIS   Basic Metabolic Panel (BMET)   Lipid Profile   Cardiac event monitor   EKG 12-Lead   ECHOCARDIOGRAM COMPLETE   Meds ordered this encounter  Medications   metoprolol tartrate (LOPRESSOR) 100 MG tablet    Sig: Take this pill 2 hours before your CT    Dispense:  1 tablet    Refill:  0    Patient Instructions  Medication Instructions:  Your physician recommends that you continue on your current medications as directed. Please refer to the Current Medication list given to you today.  If you need a refill on your cardiac medications before your next appointment, please call your pharmacy.   Lab work:  Prior to CT: Medical laboratory scientific officer and Lipid panel  If you have labs (blood work) drawn today and your tests are completely normal, you will receive your results only by:  MyChart Message (if you have MyChart) OR  A paper copy in the mail If you have any lab test that is abnormal or we need to change your treatment, we will call you to review the results.  Testing/Procedures: Your physician has requested that you have an echocardiogram. Echocardiography is a painless test that uses sound waves to create images of your heart. It provides your doctor with information about the size and shape of your heart and how well your hearts chambers and valves are working. This procedure takes approximately one hour. There are no restrictions for this procedure.  Your physician has recommended that you wear an event monitor. Event monitors are medical devices that record the hearts electrical activity. Doctors most often [place] these monitors to diagnose arrhythmias. Arrhythmias are problems with the speed or rhythm of the heartbeat. The monitor is a small, portable device. You can wear one while you do your normal daily activities. This is usually used to diagnose what is causing  palpitations/syncope (passing out).    Your cardiac CT will be scheduled at one of the below locations:   [facility] [address] [place], [place] [address] [phone] [place] [address] [place], [place] [address] [phone]  If scheduled at [facility], please arrive at the [facility] main entrance of [facility] 30-45 minutes prior to test start time. Proceed to the [facility] Radiology Department (first floor) to check-in and test prep.  If scheduled at [facility], please arrive 15 mins early for check-in and test prep.  Please follow these instructions carefully (unless otherwise directed):  On the Night Before the Test:  Be sure to Drink plenty of water.  Do not consume any caffeinated/decaffeinated beverages or chocolate 12 hours prior to your test.  Do not take any antihistamines 12 hours prior to your test    On the Day of the Test:  Drink plenty of water. Do not drink any water within one hour of the test.  Do not eat any food 4 hours prior to the test.  You may take your regular medications prior to the test.   Take metoprolol (Lopressor) two hours prior to test.  HOLD Furosemide/Hydrochlorothiazide morning of the test.  FEMALES- please wear underwire-free bra if available       After the Test:  Drink plenty of water.  After receiving IV contrast, you may experience a mild flushed feeling. This is normal.  On occasion, you may experience a mild rash up to 24 hours after the test. This is not dangerous. If this occurs, you can take Benadryl 25 mg and increase your fluid intake.  If you experience trouble breathing, this can be serious. If it is severe call 911 IMMEDIATELY. If it is mild, please call our office.  If you take any of these medications: Glipizide/Metformin, Avandament, Glucavance, please do not take 48 hours after  completing test unless otherwise instructed.    Please contact the cardiac imaging nurse navigator should you have any questions/concerns Marchia Bond, RN Navigator Cardiac Imaging [facility] [phone] Office  [phone] Cell   Follow-Up: At [facility], you and your health needs are our priority.  As part of our continuing mission to provide you with exceptional heart care, we have created designated Provider Care Teams.  These Care Teams include your primary Cardiologist (physician) and Advanced Practice Providers (APPs -  Physician Assistants and Nurse Practitioners) who all work together to provide you with the care you need, when you need it. You will need a follow up appointment in:  3 months.  Please call our office 2 months in advance to schedule this appointment.  You may see Dr. Acie Fredrickson or one of the following Advanced Practice Providers on your designated Care Team: Richardson Dopp, PA-C [place], [place]  Daune Perch, NP  Any Other Special Instructions Will Be Listed Below (If Applicable).       Signed, Mertie Moores, MD  06/26/2019 5:45 PM    [place]

## 2019-06-26 ENCOUNTER — Encounter (HOSPITAL_COMMUNITY): Payer: Self-pay | Admitting: Cardiovascular Disease

## 2019-06-26 ENCOUNTER — Other Ambulatory Visit: Payer: Self-pay

## 2019-06-26 ENCOUNTER — Encounter: Payer: Self-pay | Admitting: Cardiovascular Disease

## 2019-06-26 ENCOUNTER — Ambulatory Visit (INDEPENDENT_AMBULATORY_CARE_PROVIDER_SITE_OTHER): Payer: Medicare HMO | Admitting: Cardiovascular Disease

## 2019-06-26 VITALS — BP 108/72 | HR 68 | Ht 64.0 in | Wt 191.1 lb

## 2019-06-26 DIAGNOSIS — R002 Palpitations: Secondary | ICD-10-CM | POA: Diagnosis not present

## 2019-06-26 DIAGNOSIS — R079 Chest pain, unspecified: Secondary | ICD-10-CM

## 2019-06-26 DIAGNOSIS — I10 Essential (primary) hypertension: Secondary | ICD-10-CM

## 2019-06-26 DIAGNOSIS — R0789 Other chest pain: Secondary | ICD-10-CM | POA: Diagnosis not present

## 2019-06-26 DIAGNOSIS — Z8349 Family history of other endocrine, nutritional and metabolic diseases: Secondary | ICD-10-CM

## 2019-06-26 DIAGNOSIS — Z13220 Encounter for screening for lipoid disorders: Secondary | ICD-10-CM | POA: Diagnosis not present

## 2019-06-26 MED ORDER — METOPROLOL TARTRATE 100 MG PO TABS: ORAL_TABLET | ORAL | 0 refills | Status: DC

## 2019-06-26 NOTE — Patient Instructions (Addendum)
Medication Instructions:  Your physician recommends that you continue on your current medications as directed. Please refer to the Current Medication list given to you today.  If you need a refill on your cardiac medications before your next appointment, please call your pharmacy.   Lab work:  Prior to CT: Medical laboratory scientific officer and Lipid panel  If you have labs (blood work) drawn today and your tests are completely normal, you will receive your results only by: Marland Kitchen MyChart Message (if you have MyChart) OR . A paper copy in the mail If you have any lab test that is abnormal or we need to change your treatment, we will call you to review the results.  Testing/Procedures: Your physician has requested that you have an echocardiogram. Echocardiography is a painless test that uses sound waves to create images of your heart. It provides your doctor with information about the size and shape of your heart and how well your heart's chambers and valves are working. This procedure takes approximately one hour. There are no restrictions for this procedure.  Your physician has recommended that you wear an event monitor. Event monitors are medical devices that record the heart's electrical activity. Doctors most often [place] these monitors to diagnose arrhythmias. Arrhythmias are problems with the speed or rhythm of the heartbeat. The monitor is a small, portable device. You can wear one while you do your normal daily activities. This is usually used to diagnose what is causing palpitations/syncope (passing out).    Your cardiac CT will be scheduled at one of the below locations:   [facility] [address] [place], [place] [address] (336) [place] [address] [place],  [address] [phone]  If scheduled at [facility], please arrive at the [facility] main entrance of [facility] 30-45 minutes prior  to test start time. Proceed to the [facility] Radiology Department (first floor) to check-in and test prep.  If scheduled at [facility], please arrive 15 mins early for check-in and test prep.  Please follow these instructions carefully (unless otherwise directed):  On the Night Before the Test: . Be sure to Drink plenty of water. . Do not consume any caffeinated/decaffeinated beverages or chocolate 12 hours prior to your test. . Do not take any antihistamines 12 hours prior to your test .   On the Day of the Test: . Drink plenty of water. Do not drink any water within one hour of the test. . Do not eat any food 4 hours prior to the test. . You may take your regular medications prior to the test.  . Take metoprolol (Lopressor) two hours prior to test. . HOLD Furosemide/Hydrochlorothiazide morning of the test. . FEMALES- please wear underwire-free bra if available       After the Test: . Drink plenty of water. . After receiving IV contrast, you may experience a mild flushed feeling. This is normal. . On occasion, you may experience a mild rash up to 24 hours after the test. This is not dangerous. If this occurs, you can take Benadryl 25 mg and increase your fluid intake. . If you experience trouble breathing, this can be serious. If it is severe call 911 IMMEDIATELY. If it is mild, please call our office. . If you take any of these medications: Glipizide/Metformin, Avandament, Glucavance, please do not take 48 hours after completing test unless otherwise instructed.    Please contact  the cardiac imaging nurse navigator should you have any questions/concerns Marchia Bond, RN Navigator Cardiac Imaging [facility] [phone] Office  [phone] Cell   Follow-Up: At [facility], you and your health needs are our priority.  As part of our continuing mission to provide you with exceptional heart care, we have created  designated Provider Care Teams.  These Care Teams include your primary Cardiologist (physician) and Advanced Practice Providers (APPs -  Physician Assistants and Nurse Practitioners) who all work together to provide you with the care you need, when you need it. You will need a follow up appointment in:  3 months.  Please call our office 2 months in advance to schedule this appointment.  You may see Dr. Acie Fredrickson or one of the following Advanced Practice Providers on your designated Care Team: Richardson Dopp, PA-C [place], [place] . Daune Perch, NP  Any Other Special Instructions Will Be Listed Below (If Applicable).

## 2019-06-29 DIAGNOSIS — R69 Illness, unspecified: Secondary | ICD-10-CM | POA: Diagnosis not present

## 2019-07-01 ENCOUNTER — Encounter: Payer: Self-pay | Admitting: Sports Medicine

## 2019-07-01 ENCOUNTER — Ambulatory Visit (INDEPENDENT_AMBULATORY_CARE_PROVIDER_SITE_OTHER): Payer: Medicare HMO | Admitting: Sports Medicine

## 2019-07-01 DIAGNOSIS — F314 Bipolar disorder, current episode depressed, severe, without psychotic features: Secondary | ICD-10-CM | POA: Diagnosis not present

## 2019-07-01 DIAGNOSIS — R69 Illness, unspecified: Secondary | ICD-10-CM | POA: Diagnosis not present

## 2019-07-01 MED ORDER — BUSPIRONE HCL 15 MG PO TABS: 15.0000 mg | ORAL_TABLET | Freq: Three times a day (TID) | ORAL | 3 refills | Status: DC

## 2019-07-01 NOTE — Progress Notes (Signed)
Virtual Visit via WebEx/MyChart   I connected with  Evelyn Sanchez  on 07/01/19 via WebEx/MyChart/Doximity Video and verified that I am speaking with the correct person using two identifiers.   I discussed the limitations, risks, security and privacy concerns of performing an evaluation and management service by WebEx/MyChart/Doximity Video, including the higher likelihood of inaccurate diagnosis and treatment, and the availability of in person appointments.  We also discussed the likely need of an additional face to face encounter for complete and high quality delivery of care.  I also discussed with the patient that there may be a patient responsible charge related to this service. The patient expressed understanding and wishes to proceed.  Provider location is either at home or medical facility. Patient location is at their home, different from provider location. People involved in care of the patient during this telehealth encounter were myself, my nurse/medical assistant, and my front office/scheduling team member.  Subjective:    CC: Follow-up  HPI: Bipolar 1: Last episode of mania was 10 months ago, currently stable on Cymbalta, she was having increased anxiety so we added BuSpar 23m 3 times daily, not effective.  No suicidal or homicidal ideation, continue to try to avoid mood stabilizers for now.  I reviewed the past medical history, family history, social history, surgical history, and allergies today and no changes were needed.  Please see the problem list section below in epic for further details.  Past Medical History: Past Medical History:  Diagnosis Date  . Abnormal pap 1999   colpo  . Abnormal uterine bleeding 05/02/2012  . Anxiety   . Arthritis of both knees 2011  . Benign essential hypertension 10/22/2017  . Bilateral hand numbness 03/02/2016  . Bipolar I disorder, severe, current or most recent episode depressed, with rapid cycling ([place] 06/11/2014   07/05/2018 PHQ 9 = 20,  GAD7 = 15 08/02/2018 PHQ 9 = 14, GAD7 = 18 09/02/2018 PHQ 9 = 7, GAD7 = 7 09/30/2018 PHQ 9 = 8, GAD 7 = 8 06/10/2019 PHQ 9 = 8, GAD 7 = 16  . Chest pain, typical 09/30/2018  . Chlamydia   . Chronic pain syndrome 04/06/2016  . Depression   . Family history of ovarian cancer 05/02/2012  . Femoral neuropathy of left lower extremity 11/26/2014   NCV/EMG: Chronic neurogenic changes to left vastus lateralis, could be old L4 radiculopathy, femoral neuropathy, focal muscle damage.  No other evidence of radiculopathy.   . Fracture of coronoid process of ulna, left, closed 11/12/2014  . Ganglion cyst of flexor tendon sheath of finger, right 07/05/2018  . Ganglion cyst of wrist 01/01/2014  . GERD (gastroesophageal reflux disease)   . Hair loss 06/10/2019  . Hypertension   . Lateral epicondylitis of right elbow 11/15/2016  . Lumbar facet arthropathy 03/08/2017  . Malignant melanoma ([place] 11/19/2017  . Melanoma ([place]   . Migraine without status migrainosus, not intractable 03/19/2014   Overview:  Last Assessment & Plan:  Per patient request referral to neurology.  . Migraines   . Obesity 02/03/2014  . Ovarian cyst   . Periapical abscess 01/08/2019  . Pneumonia   . Post traumatic stress disorder 03/09/2012  . Premenstrual tension syndrome 02/10/2014   Overview:  Overview:  OCPs started 02/10/14  . Primary osteoarthritis of left knee 01/01/2014  . Severe acute respiratory syndrome coronavirus 2 (SARS-CoV-2) detected 10/14/2018  . Shivering 03/05/2019  . Trichomonas   . Vaginal Pap smear, abnormal   . Weakness of both legs 12/17/2012  Past Surgical History: Past Surgical History:  Procedure Laterality Date  . ABLATION    . APPENDECTOMY  1996  . KNEE ARTHROSCOPY  1991   Left Kneex2  . LYMPH NODE BIOPSY Left 12/11/2017   Procedure: SENTINEL LYMPH NODE MAPPING  BIOPSY;  Surgeon: Stark Klein, MD;  Location: [place];  Service: General;  Laterality: Left;  Marland Kitchen MELANOMA EXCISION Left 12/11/2017   Procedure: EXCISION OF  PRIOR SCAR LEFT SHOULDER MELANOMA WITH CLOSURE;  Surgeon: Stark Klein, MD;  Location: [place];  Service: General;  Laterality: Left;  . Tonsilectomy  1984  . TONSILLECTOMY    . TUBAL LIGATION  1999  . TUBAL LIGATION    . TUBOPLASTY / [place]   Four times between 1980-1990   Social History: Social History   Socioeconomic History  . Marital status: Married    Spouse name: Not on file  . Number of children: Not on file  . Years of education: Not on file  . Highest education level: Not on file  Occupational History  . Not on file  Social Needs  . Financial resource strain: Not on file  . Food insecurity    Worry: Not on file    Inability: Not on file  . Transportation needs    Medical: Not on file    Non-medical: Not on file  Tobacco Use  . Smoking status: Former Smoker    Packs/day: 0.50    Years: 10.00    Pack years: 5.00    Types: Cigarettes    Quit date: 11/08/2009    Years since quitting: 9.6  . Smokeless tobacco: Never Used  Substance and Sexual Activity  . Alcohol use: No  . Drug use: No  . Sexual activity: Yes    Partners: Male    Birth control/protection: Other-see comments    Comment: Tubal Ligation  Lifestyle  . Physical activity    Days per week: Not on file    Minutes per session: Not on file  . Stress: Not on file  Relationships  . Social Herbalist on phone: Not on file    Gets together: Not on file    Attends religious service: Not on file    Active member of club or organization: Not on file    Attends meetings of clubs or organizations: Not on file    Relationship status: Not on file  Other Topics Concern  . Not on file  Social History Narrative  . Not on file   Family History: Family History  Problem Relation Age of Onset  . Hypertension Maternal Grandfather   . Diabetes Maternal Grandfather   . Stroke Maternal Grandfather   . Hypertension Maternal Grandmother   . Cancer Maternal Grandmother   .  Hypertension Paternal Grandfather   . Diabetes Paternal Grandfather   . Hypertension Paternal Grandmother   . Hypertension Mother   . Depression Mother   . Diabetes type II Mother   . Diabetes Mother   . Heart attack Father   . Hypertension Father   . Diabetes type II Father   . Diabetes Father   . Hyperlipidemia Father   . Depression Sister   . Diabetes type II Sister   . Diabetes Sister   . Hyperlipidemia Brother   . Cancer Maternal Aunt   . Alcohol abuse Maternal Uncle   . Alcohol abuse Paternal Uncle    Allergies: Allergies  Allergen Reactions  . Ambien [Zolpidem Tartrate] Other (See Comments)  hallucinations   Medications: See med rec.  Review of Systems: No fevers, chills, night sweats, weight loss, chest pain, or shortness of breath.   Objective:    General: Speaking full sentences, no audible heavy breathing.  Sounds alert and appropriately interactive.  Appears well.  Face symmetric.  Extraocular movements intact.  Pupils equal and round.  No nasal flaring or accessory muscle use visualized.  No other physical exam performed due to the non-physical nature of this visit.  Impression and Recommendations:    Bipolar I disorder, severe, current or most recent episode depressed, with rapid cycling ([place]) Previously diagnosed bipolar 1 with rare episodes of mania. The most recent episode of mania was approximately 10 months ago. Overall well-controlled with Cymbalta, significant anxiety right now, not much better with BuSpar 5 3 times daily, increasing to 15 mg 3 times daily and we can do a virtual visit in 1 month to reevaluate PHQ and GAD. We are still holding off on a mood stabilizer, she does have a behavioral therapist in [place], see prior A&P for details.   I discussed the above assessment and treatment plan with the patient. The patient was provided an opportunity to ask questions and all were answered. The patient agreed with the plan and demonstrated an  understanding of the instructions.   The patient was advised to call back or seek an in-person evaluation if the symptoms worsen or if the condition fails to improve as anticipated.   I provided 25 minutes of non-face-to-face time during this encounter, 15 minutes of additional time was needed to gather information, review chart, records, communicate/coordinate with staff remotely, troubleshooting the multiple errors that we get every time when trying to do video calls through the electronic medical record, WebEx, and Doximity, restart the encounter multiple times due to instability of the software, as well as complete documentation.   ___________________________________________ Gwen Her. Dianah Field, M.D., ABFM., CAQSM. Primary Care and Sports Medicine [facility]  Adjunct Professor of [place] of [facility] [employer] of [employer]

## 2019-07-01 NOTE — Assessment & Plan Note (Addendum)
Previously diagnosed bipolar 1 with rare episodes of mania. The most recent episode of mania was approximately 10 months ago. Overall well-controlled with Cymbalta, significant anxiety right now, not much better with BuSpar 5 3 times daily, increasing to 15 mg 3 times daily and we can do a virtual visit in 1 month to reevaluate PHQ and GAD. We are still holding off on a mood stabilizer, she does have a behavioral therapist in [place], see prior A&P for details.

## 2019-07-02 ENCOUNTER — Other Ambulatory Visit (HOSPITAL_COMMUNITY): Payer: Medicare HMO

## 2019-07-08 ENCOUNTER — Telehealth: Payer: Self-pay | Admitting: *Deleted

## 2019-07-08 NOTE — Telephone Encounter (Signed)
[employer] to ship a 30 day cardiac event monitor to the patients home.  Instructions reviewed briefly as they are included in the monitor kit. 

## 2019-07-11 ENCOUNTER — Ambulatory Visit (HOSPITAL_COMMUNITY): Payer: Medicare HMO | Attending: Cardiovascular Disease

## 2019-07-11 ENCOUNTER — Other Ambulatory Visit: Payer: Self-pay

## 2019-07-11 DIAGNOSIS — Z79899 Other long term (current) drug therapy: Secondary | ICD-10-CM | POA: Diagnosis not present

## 2019-07-11 DIAGNOSIS — Z8619 Personal history of other infectious and parasitic diseases: Secondary | ICD-10-CM | POA: Diagnosis not present

## 2019-07-11 DIAGNOSIS — R002 Palpitations: Secondary | ICD-10-CM

## 2019-07-11 DIAGNOSIS — Z87891 Personal history of nicotine dependence: Secondary | ICD-10-CM | POA: Diagnosis not present

## 2019-07-11 DIAGNOSIS — R079 Chest pain, unspecified: Secondary | ICD-10-CM | POA: Diagnosis not present

## 2019-07-11 DIAGNOSIS — R1030 Lower abdominal pain, unspecified: Secondary | ICD-10-CM | POA: Diagnosis not present

## 2019-07-11 DIAGNOSIS — Z888 Allergy status to other drugs, medicaments and biological substances status: Secondary | ICD-10-CM | POA: Diagnosis not present

## 2019-07-11 DIAGNOSIS — Y92410 Unspecified street and highway as the place of occurrence of the external cause: Secondary | ICD-10-CM | POA: Diagnosis not present

## 2019-07-11 DIAGNOSIS — Z7982 Long term (current) use of aspirin: Secondary | ICD-10-CM | POA: Diagnosis not present

## 2019-07-11 DIAGNOSIS — M47816 Spondylosis without myelopathy or radiculopathy, lumbar region: Secondary | ICD-10-CM | POA: Diagnosis not present

## 2019-07-11 DIAGNOSIS — Z791 Long term (current) use of non-steroidal anti-inflammatories (NSAID): Secondary | ICD-10-CM | POA: Diagnosis not present

## 2019-07-11 DIAGNOSIS — M545 Low back pain: Secondary | ICD-10-CM | POA: Diagnosis not present

## 2019-07-11 DIAGNOSIS — R0789 Other chest pain: Secondary | ICD-10-CM | POA: Diagnosis not present

## 2019-07-11 DIAGNOSIS — G8921 Chronic pain due to trauma: Secondary | ICD-10-CM | POA: Diagnosis not present

## 2019-07-11 DIAGNOSIS — Z85820 Personal history of malignant melanoma of skin: Secondary | ICD-10-CM | POA: Diagnosis not present

## 2019-07-16 ENCOUNTER — Ambulatory Visit (INDEPENDENT_AMBULATORY_CARE_PROVIDER_SITE_OTHER): Payer: Medicare HMO

## 2019-07-16 ENCOUNTER — Other Ambulatory Visit: Payer: Self-pay

## 2019-07-16 DIAGNOSIS — Z01419 Encounter for gynecological examination (general) (routine) without abnormal findings: Secondary | ICD-10-CM | POA: Diagnosis not present

## 2019-07-16 DIAGNOSIS — Z1231 Encounter for screening mammogram for malignant neoplasm of breast: Secondary | ICD-10-CM

## 2019-07-18 ENCOUNTER — Ambulatory Visit (INDEPENDENT_AMBULATORY_CARE_PROVIDER_SITE_OTHER): Payer: Medicare Other

## 2019-07-18 ENCOUNTER — Other Ambulatory Visit: Payer: Self-pay | Admitting: Nurse Practitioner

## 2019-07-18 DIAGNOSIS — R002 Palpitations: Secondary | ICD-10-CM

## 2019-07-18 DIAGNOSIS — R079 Chest pain, unspecified: Secondary | ICD-10-CM | POA: Diagnosis not present

## 2019-07-24 ENCOUNTER — Other Ambulatory Visit: Payer: Self-pay | Admitting: Sports Medicine

## 2019-07-24 DIAGNOSIS — F314 Bipolar disorder, current episode depressed, severe, without psychotic features: Secondary | ICD-10-CM

## 2019-08-04 ENCOUNTER — Other Ambulatory Visit: Payer: Self-pay

## 2019-08-04 ENCOUNTER — Ambulatory Visit (INDEPENDENT_AMBULATORY_CARE_PROVIDER_SITE_OTHER): Payer: Medicare Other | Admitting: Sports Medicine

## 2019-08-04 ENCOUNTER — Encounter: Payer: Self-pay | Admitting: Sports Medicine

## 2019-08-04 DIAGNOSIS — M1712 Unilateral primary osteoarthritis, left knee: Secondary | ICD-10-CM

## 2019-08-04 NOTE — Progress Notes (Signed)
Subjective:    CC: Left knee pain  HPI: This is a pleasant 43 year old female, she has failed multiple nonoperative modalities including multiple steroid injections, viscosupplementation, arthroscopy x3, PT, NSAIDs.  Unfortunately her surgeons continue to tell her that she is too young for knee replacement.  I would like her to return to Dr. Dorna Leitz for another surgical opinion.  I reviewed the past medical history, family history, social history, surgical history, and allergies today and no changes were needed.  Please see the problem list section below in epic for further details.  Past Medical History: Past Medical History:  Diagnosis Date  . Abnormal pap 1999   colpo  . Abnormal uterine bleeding 05/02/2012  . Anxiety   . Arthritis of both knees 2011  . Benign essential hypertension 10/22/2017  . Bilateral hand numbness 03/02/2016  . Bipolar I disorder, severe, current or most recent episode depressed, with rapid cycling ([place]) 06/11/2014   07/05/2018 PHQ 9 = 20, GAD7 = 15 08/02/2018 PHQ 9 = 14, GAD7 = 18 09/02/2018 PHQ 9 = 7, GAD7 = 7 09/30/2018 PHQ 9 = 8, GAD 7 = 8 06/10/2019 PHQ 9 = 8, GAD 7 = 16  . Chest pain, typical 09/30/2018  . Chlamydia   . Chronic pain syndrome 04/06/2016  . Depression   . Family history of ovarian cancer 05/02/2012  . Femoral neuropathy of left lower extremity 11/26/2014   NCV/EMG: Chronic neurogenic changes to left vastus lateralis, could be old L4 radiculopathy, femoral neuropathy, focal muscle damage.  No other evidence of radiculopathy.   . Fracture of coronoid process of ulna, left, closed 11/12/2014  . Ganglion cyst of flexor tendon sheath of finger, right 07/05/2018  . Ganglion cyst of wrist 01/01/2014  . GERD (gastroesophageal reflux disease)   . Hair loss 06/10/2019  . Hypertension   . Lateral epicondylitis of right elbow 11/15/2016  . Lumbar facet arthropathy 03/08/2017  . Malignant melanoma ([place]) 11/19/2017  . Melanoma ([place])   . Migraine without  status migrainosus, not intractable 03/19/2014   Overview:  Last Assessment & Plan:  Per patient request referral to neurology.  . Migraines   . Obesity 02/03/2014  . Ovarian cyst   . Periapical abscess 01/08/2019  . Pneumonia   . Post traumatic stress disorder 03/09/2012  . Premenstrual tension syndrome 02/10/2014   Overview:  Overview:  OCPs started 02/10/14  . Primary osteoarthritis of left knee 01/01/2014  . Severe acute respiratory syndrome coronavirus 2 (SARS-CoV-2) detected 10/14/2018  . Shivering 03/05/2019  . Trichomonas   . Vaginal Pap smear, abnormal   . Weakness of both legs 12/17/2012   Past Surgical History: Past Surgical History:  Procedure Laterality Date  . ABLATION    . APPENDECTOMY  1996  . KNEE ARTHROSCOPY  1991   Left Kneex2  . LYMPH NODE BIOPSY Left 12/11/2017   Procedure: SENTINEL LYMPH NODE MAPPING  BIOPSY;  Surgeon: Stark Klein, MD;  Location: [place];  Service: General;  Laterality: Left;  Marland Kitchen MELANOMA EXCISION Left 12/11/2017   Procedure: EXCISION OF PRIOR SCAR LEFT SHOULDER MELANOMA WITH CLOSURE;  Surgeon: Stark Klein, MD;  Location: [place];  Service: General;  Laterality: Left;  . Tonsilectomy  1984  . TONSILLECTOMY    . TUBAL LIGATION  1999  . TUBAL LIGATION    . TUBOPLASTY / [place]   Four times between 1980-1990   Social History: Social History   Socioeconomic History  . Marital status: Married    Spouse  name: Not on file  . Number of children: Not on file  . Years of education: Not on file  . Highest education level: Not on file  Occupational History  . Not on file  Social Needs  . Financial resource strain: Not on file  . Food insecurity    Worry: Not on file    Inability: Not on file  . Transportation needs    Medical: Not on file    Non-medical: Not on file  Tobacco Use  . Smoking status: Former Smoker    Packs/day: 0.50    Years: 10.00    Pack years: 5.00    Types: Cigarettes    Quit date: 11/08/2009    Years since  quitting: 9.7  . Smokeless tobacco: Never Used  Substance and Sexual Activity  . Alcohol use: No  . Drug use: No  . Sexual activity: Yes    Partners: Male    Birth control/protection: Other-see comments    Comment: Tubal Ligation  Lifestyle  . Physical activity    Days per week: Not on file    Minutes per session: Not on file  . Stress: Not on file  Relationships  . Social Herbalist on phone: Not on file    Gets together: Not on file    Attends religious service: Not on file    Active member of club or organization: Not on file    Attends meetings of clubs or organizations: Not on file    Relationship status: Not on file  Other Topics Concern  . Not on file  Social History Narrative  . Not on file   Family History: Family History  Problem Relation Age of Onset  . Hypertension Maternal Grandfather   . Diabetes Maternal Grandfather   . Stroke Maternal Grandfather   . Hypertension Maternal Grandmother   . Cancer Maternal Grandmother   . Hypertension Paternal Grandfather   . Diabetes Paternal Grandfather   . Hypertension Paternal Grandmother   . Hypertension Mother   . Depression Mother   . Diabetes type II Mother   . Diabetes Mother   . Heart attack Father   . Hypertension Father   . Diabetes type II Father   . Diabetes Father   . Hyperlipidemia Father   . Depression Sister   . Diabetes type II Sister   . Diabetes Sister   . Hyperlipidemia Brother   . Cancer Maternal Aunt   . Alcohol abuse Maternal Uncle   . Alcohol abuse Paternal Uncle    Allergies: Allergies  Allergen Reactions  . Ambien [Zolpidem Tartrate] Other (See Comments)    hallucinations   Medications: See med rec.  Review of Systems: No fevers, chills, night sweats, weight loss, chest pain, or shortness of breath.   Objective:    General: Well Developed, well nourished, and in no acute distress.  Neuro: Alert and oriented x3, extra-ocular muscles intact, sensation grossly intact.   HEENT: Normocephalic, atraumatic, pupils equal round reactive to light, neck supple, no masses, no lymphadenopathy, thyroid nonpalpable.  Skin: Warm and dry, no rashes. Cardiac: Regular rate and rhythm, no murmurs rubs or gallops, no lower extremity edema.  Respiratory: Clear to auscultation bilaterally. Not using accessory muscles, speaking in full sentences. Left knee: Normal to inspection with no erythema or effusion or obvious bony abnormalities. Tender to palpation of the joint lines ROM normal in flexion and extension and lower leg rotation. Ligaments with solid consistent endpoints including ACL, PCL, LCL, MCL.  Negative Mcmurray's and provocative meniscal tests. Non painful patellar compression. Patellar and quadriceps tendons unremarkable. Hamstring and quadriceps strength is normal.  Procedure: Real-time Ultrasound Guided injection of the left knee Device: GE Logiq E  Verbal informed consent obtained.  Time-out conducted.  Noted no overlying erythema, induration, or other signs of local infection.  Skin prepped in a sterile fashion.  Local anesthesia: Topical Ethyl chloride.  With sterile technique and under real time ultrasound guidance:  1 cc Kenalog 40, 2 cc lidocaine, 2 cc bupivacaine injected easily Completed without difficulty  Pain immediately resolved suggesting accurate placement of the medication.  Advised to call if fevers/chills, erythema, induration, drainage, or persistent bleeding.  Images permanently stored and available for review in the ultrasound unit.  Impression: Technically successful ultrasound guided injection.  Impression and Recommendations:    Primary osteoarthritis of left knee Left knee injection as above, previously injected November 2019. We have discussed this before but she does need an arthroplasty.  She tells me that many of her surgeons have told her she is too young for this. She has already failed viscosupplementation, steroid  injections, NSAIDs, and arthroscopy.   ___________________________________________ Gwen Her. Dianah Field, M.D., ABFM., CAQSM. Primary Care and Sports Medicine [facility]  Adjunct Professor of [place] [employer] [facility] of Medicine

## 2019-08-04 NOTE — Assessment & Plan Note (Signed)
Left knee injection as above, previously injected November 2019. We have discussed this before but she does need an arthroplasty.  She tells me that many of her surgeons have told her she is too young for this. She has already failed viscosupplementation, steroid injections, NSAIDs, and arthroscopy.

## 2019-08-07 ENCOUNTER — Other Ambulatory Visit: Payer: Self-pay | Admitting: Sports Medicine

## 2019-08-07 DIAGNOSIS — G43909 Migraine, unspecified, not intractable, without status migrainosus: Secondary | ICD-10-CM

## 2019-08-15 ENCOUNTER — Other Ambulatory Visit: Payer: Self-pay | Admitting: Cardiovascular Disease

## 2019-08-15 ENCOUNTER — Telehealth (HOSPITAL_COMMUNITY): Payer: Self-pay | Admitting: Emergency Medicine

## 2019-08-15 ENCOUNTER — Other Ambulatory Visit: Payer: Self-pay | Admitting: Nurse Practitioner

## 2019-08-15 DIAGNOSIS — I10 Essential (primary) hypertension: Secondary | ICD-10-CM

## 2019-08-15 DIAGNOSIS — R079 Chest pain, unspecified: Secondary | ICD-10-CM

## 2019-08-15 DIAGNOSIS — Z13220 Encounter for screening for lipoid disorders: Secondary | ICD-10-CM

## 2019-08-15 NOTE — Telephone Encounter (Signed)
Reaching out to patient to offer assistance regarding upcoming cardiac imaging study; pt verbalizes understanding of appt date/time, parking situation and where to check in, pre-test NPO status and medications ordered, and verified current allergies; name and call back number provided for further questions should they arise Malachai Schalk RN Navigator Cardiac Imaging [facility] [phone] office [phone] cell 

## 2019-08-16 ENCOUNTER — Other Ambulatory Visit: Payer: Self-pay | Admitting: Sports Medicine

## 2019-08-16 DIAGNOSIS — I10 Essential (primary) hypertension: Secondary | ICD-10-CM

## 2019-08-16 LAB — LIPID PANEL
Cholesterol: 206 mg/dL — ABNORMAL HIGH (ref <200)
HDL: 41 mg/dL — ABNORMAL LOW (ref >=50)
LDL Cholesterol (Calc): 110 mg/dL (calc) — ABNORMAL HIGH
Non-HDL Cholesterol (Calc): 165 mg/dL (calc) — ABNORMAL HIGH (ref <130)
Total CHOL/HDL Ratio: 5 (calc) — ABNORMAL HIGH (ref <5.0)
Triglycerides: 384 mg/dL — ABNORMAL HIGH (ref <150)

## 2019-08-16 LAB — BASIC METABOLIC PANEL WITH GFR
BUN: 20 mg/dL (ref 7–25)
CO2: 23 mmol/L (ref 20–32)
Calcium: 9.5 mg/dL (ref 8.6–10.2)
Chloride: 107 mmol/L (ref 98–110)
Creat: 0.97 mg/dL (ref 0.50–1.10)
GFR, Est African American: 82 mL/min/{1.73_m2} (ref >=60)
GFR, Est Non African American: 71 mL/min/{1.73_m2} (ref >=60)
Glucose, Bld: 99 mg/dL (ref 65–99)
Potassium: 4.3 mmol/L (ref 3.5–5.3)
Sodium: 140 mmol/L (ref 135–146)

## 2019-08-18 ENCOUNTER — Ambulatory Visit (HOSPITAL_COMMUNITY): Payer: Medicare Other

## 2019-08-26 ENCOUNTER — Telehealth (HOSPITAL_COMMUNITY): Payer: Self-pay | Admitting: Emergency Medicine

## 2019-08-26 NOTE — Telephone Encounter (Signed)
Reaching out to patient to offer assistance regarding upcoming cardiac imaging study; pt verbalizes understanding of appt date/time, parking situation and where to check in, pre-test NPO status and medications ordered, and verified current allergies; name and call back number provided for further questions should they arise Soniyah Mcglory RN Navigator Cardiac Imaging [facility] [phone] office [phone] cell 

## 2019-08-28 ENCOUNTER — Ambulatory Visit (HOSPITAL_COMMUNITY)
Admission: RE | Admit: 2019-08-28 | Discharge: 2019-08-28 | Disposition: A | Discharge status: Home / Self Care | Payer: Medicare Other | Source: Ambulatory Visit | Attending: Cardiovascular Disease | Admitting: Cardiovascular Disease

## 2019-08-28 ENCOUNTER — Other Ambulatory Visit: Payer: Self-pay

## 2019-08-28 DIAGNOSIS — I10 Essential (primary) hypertension: Secondary | ICD-10-CM | POA: Diagnosis not present

## 2019-08-28 DIAGNOSIS — R002 Palpitations: Secondary | ICD-10-CM | POA: Insufficient documentation

## 2019-08-28 DIAGNOSIS — R079 Chest pain, unspecified: Secondary | ICD-10-CM | POA: Insufficient documentation

## 2019-08-28 MED ORDER — NITROGLYCERIN 0.4 MG SL SUBL: 0.8000 mg | SUBLINGUAL_TABLET | Freq: Once | SUBLINGUAL | Status: DC

## 2019-08-28 MED ORDER — IOHEXOL 300 MG/ML  SOLN
100.0000 mL | Freq: Once | INTRAMUSCULAR | Status: AC | PRN
  Administered 2019-08-28: 100 mL via INTRAVENOUS

## 2019-08-28 MED ORDER — NITROGLYCERIN 0.4 MG SL SUBL
SUBLINGUAL_TABLET | SUBLINGUAL | Status: AC
  Filled 2019-08-28: qty 2

## 2019-08-28 NOTE — Progress Notes (Signed)
Patient completed snack and drink. Denies any complaints. BP rechecked. 88/68. Dr. Meda Coffee called.

## 2019-08-28 NOTE — Progress Notes (Signed)
Spoke to Dr. Meda Coffee, pt given caffine drink. And patient may go home per Dr. Meda Coffee. Patient denies any complaints.

## 2019-08-28 NOTE — Progress Notes (Signed)
Ct complete. Patient denies any complaints. Offered patient snack and drink.

## 2019-09-23 ENCOUNTER — Encounter: Payer: Self-pay | Admitting: Sports Medicine

## 2019-09-23 ENCOUNTER — Other Ambulatory Visit: Payer: Self-pay

## 2019-09-23 ENCOUNTER — Ambulatory Visit (INDEPENDENT_AMBULATORY_CARE_PROVIDER_SITE_OTHER): Payer: Medicare Other | Admitting: Sports Medicine

## 2019-09-23 DIAGNOSIS — E781 Pure hyperglyceridemia: Secondary | ICD-10-CM

## 2019-09-23 DIAGNOSIS — M1712 Unilateral primary osteoarthritis, left knee: Secondary | ICD-10-CM | POA: Diagnosis not present

## 2019-09-23 NOTE — Assessment & Plan Note (Signed)
Just injected again back in October, persistent pain, she has already failed viscosupplementation, multiple steroid injections, NSAIDs, she has had an arthroscopy, medial surgeons have told her she is too young for arthroplasty but I do not think we have another option at this point. Referral to [facility] orthopedic surgery, [facility] She does get hydrocodone from a pain management provider.

## 2019-09-23 NOTE — Progress Notes (Signed)
Subjective:    CC: Left knee pain  HPI: Evelyn Sanchez returns, she is a pleasant 43 year old female with known left knee osteoarthritis, at this point she has failed everything nonoperative including NSAIDs, analgesics, activity modification, steroid injections, viscosupplementation.  She has persistent discomfort, her last injection was 2 months ago.  I reviewed the past medical history, family history, social history, surgical history, and allergies today and no changes were needed.  Please see the problem list section below in epic for further details.  Past Medical History: Past Medical History:  Diagnosis Date  . Abnormal pap 1999   colpo  . Abnormal uterine bleeding 05/02/2012  . Anxiety   . Arthritis of both knees 2011  . Benign essential hypertension 10/22/2017  . Bilateral hand numbness 03/02/2016  . Bipolar I disorder, severe, current or most recent episode depressed, with rapid cycling ([place]) 06/11/2014   07/05/2018 PHQ 9 = 20, GAD7 = 15 08/02/2018 PHQ 9 = 14, GAD7 = 18 09/02/2018 PHQ 9 = 7, GAD7 = 7 09/30/2018 PHQ 9 = 8, GAD 7 = 8 06/10/2019 PHQ 9 = 8, GAD 7 = 16  . Chest pain, typical 09/30/2018  . Chlamydia   . Chronic pain syndrome 04/06/2016  . Depression   . Family history of ovarian cancer 05/02/2012  . Femoral neuropathy of left lower extremity 11/26/2014   NCV/EMG: Chronic neurogenic changes to left vastus lateralis, could be old L4 radiculopathy, femoral neuropathy, focal muscle damage.  No other evidence of radiculopathy.   . Fracture of coronoid process of ulna, left, closed 11/12/2014  . Ganglion cyst of flexor tendon sheath of finger, right 07/05/2018  . Ganglion cyst of wrist 01/01/2014  . GERD (gastroesophageal reflux disease)   . Hair loss 06/10/2019  . Hypertension   . Lateral epicondylitis of right elbow 11/15/2016  . Lumbar facet arthropathy 03/08/2017  . Malignant melanoma ([place]) 11/19/2017  . Melanoma ([place])   . Migraine without status migrainosus, not intractable  03/19/2014   Overview:  Last Assessment & Plan:  Per patient request referral to neurology.  . Migraines   . Obesity 02/03/2014  . Ovarian cyst   . Periapical abscess 01/08/2019  . Pneumonia   . Post traumatic stress disorder 03/09/2012  . Premenstrual tension syndrome 02/10/2014   Overview:  Overview:  OCPs started 02/10/14  . Primary osteoarthritis of left knee 01/01/2014  . Severe acute respiratory syndrome coronavirus 2 (SARS-CoV-2) detected 10/14/2018  . Shivering 03/05/2019  . Trichomonas   . Vaginal Pap smear, abnormal   . Weakness of both legs 12/17/2012   Past Surgical History: Past Surgical History:  Procedure Laterality Date  . ABLATION    . APPENDECTOMY  1996  . KNEE ARTHROSCOPY  1991   Left Kneex2  . LYMPH NODE BIOPSY Left 12/11/2017   Procedure: SENTINEL LYMPH NODE MAPPING  BIOPSY;  Surgeon: Stark Klein, MD;  Location: [place];  Service: General;  Laterality: Left;  Marland Kitchen MELANOMA EXCISION Left 12/11/2017   Procedure: EXCISION OF PRIOR SCAR LEFT SHOULDER MELANOMA WITH CLOSURE;  Surgeon: Stark Klein, MD;  Location: [place];  Service: General;  Laterality: Left;  . Tonsilectomy  1984  . TONSILLECTOMY    . TUBAL LIGATION  1999  . TUBAL LIGATION    . TUBOPLASTY / [place]   Four times between 1980-1990   Social History: Social History   Socioeconomic History  . Marital status: Married    Spouse name: Not on file  . Number of children: Not on  file  . Years of education: Not on file  . Highest education level: Not on file  Occupational History  . Not on file  Social Needs  . Financial resource strain: Not on file  . Food insecurity    Worry: Not on file    Inability: Not on file  . Transportation needs    Medical: Not on file    Non-medical: Not on file  Tobacco Use  . Smoking status: Former Smoker    Packs/day: 0.50    Years: 10.00    Pack years: 5.00    Types: Cigarettes    Quit date: 11/08/2009    Years since quitting: 9.8  . Smokeless  tobacco: Never Used  Substance and Sexual Activity  . Alcohol use: No  . Drug use: No  . Sexual activity: Yes    Partners: Male    Birth control/protection: Other-see comments    Comment: Tubal Ligation  Lifestyle  . Physical activity    Days per week: Not on file    Minutes per session: Not on file  . Stress: Not on file  Relationships  . Social Herbalist on phone: Not on file    Gets together: Not on file    Attends religious service: Not on file    Active member of club or organization: Not on file    Attends meetings of clubs or organizations: Not on file    Relationship status: Not on file  Other Topics Concern  . Not on file  Social History Narrative  . Not on file   Family History: Family History  Problem Relation Age of Onset  . Hypertension Maternal Grandfather   . Diabetes Maternal Grandfather   . Stroke Maternal Grandfather   . Hypertension Maternal Grandmother   . Cancer Maternal Grandmother   . Hypertension Paternal Grandfather   . Diabetes Paternal Grandfather   . Hypertension Paternal Grandmother   . Hypertension Mother   . Depression Mother   . Diabetes type II Mother   . Diabetes Mother   . Heart attack Father   . Hypertension Father   . Diabetes type II Father   . Diabetes Father   . Hyperlipidemia Father   . Depression Sister   . Diabetes type II Sister   . Diabetes Sister   . Hyperlipidemia Brother   . Cancer Maternal Aunt   . Alcohol abuse Maternal Uncle   . Alcohol abuse Paternal Uncle    Allergies: Allergies  Allergen Reactions  . Ambien [Zolpidem Tartrate] Other (See Comments)    hallucinations   Medications: See med rec.  Review of Systems: No fevers, chills, night sweats, weight loss, chest pain, or shortness of breath.   Objective:    General: Well Developed, well nourished, and in no acute distress.  Neuro: Alert and oriented x3, extra-ocular muscles intact, sensation grossly intact.  HEENT: Normocephalic,  atraumatic, pupils equal round reactive to light, neck supple, no masses, no lymphadenopathy, thyroid nonpalpable.  Skin: Warm and dry, no rashes. Cardiac: Regular rate and rhythm, no murmurs rubs or gallops, no lower extremity edema.  Respiratory: Clear to auscultation bilaterally. Not using accessory muscles, speaking in full sentences.  Impression and Recommendations:    Primary osteoarthritis of left knee Just injected again back in October, persistent pain, she has already failed viscosupplementation, multiple steroid injections, NSAIDs, she has had an arthroscopy, medial surgeons have told her she is too young for arthroplasty but I do not think we have  another option at this point. Referral to [facility] orthopedic surgery, [facility] She does get hydrocodone from a pain management provider.   ___________________________________________ Gwen Her. Dianah Field, M.D., ABFM., CAQSM. Primary Care and Sports Medicine [facility]  Adjunct Professor of [place] of [facility] of Medicine

## 2019-09-24 DIAGNOSIS — E781 Pure hyperglyceridemia: Secondary | ICD-10-CM | POA: Insufficient documentation

## 2019-09-24 MED ORDER — OMEGA-3-ACID ETHYL ESTERS 1 G PO CAPS: 2.0000 g | ORAL_CAPSULE | Freq: Two times a day (BID) | ORAL | 3 refills | Status: DC

## 2019-09-24 MED ORDER — FENOFIBRATE 160 MG PO TABS: 160.0000 mg | ORAL_TABLET | Freq: Every day | ORAL | 3 refills | Status: DC

## 2019-09-24 NOTE — Assessment & Plan Note (Signed)
Adding fenofibrate, fish oil, recheck lipids in 3 months.

## 2019-09-26 ENCOUNTER — Other Ambulatory Visit: Payer: Self-pay | Admitting: Sports Medicine

## 2019-09-26 DIAGNOSIS — F314 Bipolar disorder, current episode depressed, severe, without psychotic features: Secondary | ICD-10-CM

## 2019-10-07 ENCOUNTER — Other Ambulatory Visit: Payer: Self-pay | Admitting: Sports Medicine

## 2019-10-07 DIAGNOSIS — R0789 Other chest pain: Secondary | ICD-10-CM

## 2019-10-07 MED ORDER — ASPIRIN EC 81 MG PO TBEC: 81.0000 mg | DELAYED_RELEASE_TABLET | Freq: Every day | ORAL | 3 refills | Status: DC

## 2019-10-24 ENCOUNTER — Ambulatory Visit: Payer: Medicare Other | Admitting: Cardiovascular Disease

## 2019-11-05 MED ORDER — TRIAZOLAM 0.25 MG PO TABS: ORAL_TABLET | ORAL | 0 refills | Status: DC

## 2019-12-18 HISTORY — PX: TOTAL KNEE ARTHROPLASTY: SHX125

## 2020-01-09 DIAGNOSIS — N179 Acute kidney failure, unspecified: Secondary | ICD-10-CM | POA: Insufficient documentation

## 2020-01-13 MED ORDER — POLYETHYLENE GLYCOL 3350 17 GM/SCOOP PO POWD
17.00 | ORAL | Status: DC
Start: ? — End: 2020-01-13

## 2020-01-13 MED ORDER — LACTULOSE 10 GM/15ML PO SOLN
30.00 | ORAL | Status: DC
Start: ? — End: 2020-01-13

## 2020-01-13 MED ORDER — DIPHENHYDRAMINE HCL 25 MG PO CAPS
25.00 | ORAL_CAPSULE | ORAL | Status: DC
Start: ? — End: 2020-01-13

## 2020-01-13 MED ORDER — OXYCODONE HCL 5 MG PO TABS
10.00 | ORAL_TABLET | ORAL | Status: DC
Start: ? — End: 2020-01-13

## 2020-01-13 MED ORDER — ALBUTEROL SULFATE (2.5 MG/3ML) 0.083% IN NEBU
2.50 | INHALATION_SOLUTION | RESPIRATORY_TRACT | Status: DC
Start: ? — End: 2020-01-13

## 2020-01-13 MED ORDER — HYDRALAZINE HCL 10 MG PO TABS
10.00 | ORAL_TABLET | ORAL | Status: DC
Start: ? — End: 2020-01-13

## 2020-01-13 MED ORDER — SUMATRIPTAN SUCCINATE 50 MG PO TABS
50.00 | ORAL_TABLET | ORAL | Status: DC
Start: ? — End: 2020-01-13

## 2020-01-13 MED ORDER — BUSPIRONE HCL 15 MG PO TABS
15.00 | ORAL_TABLET | ORAL | Status: DC
Start: 2020-01-13 — End: 2020-01-13

## 2020-01-13 MED ORDER — GENERIC EXTERNAL MEDICATION
6.00 | Status: DC
Start: 2020-01-13 — End: 2020-01-13

## 2020-01-13 MED ORDER — ACETAMINOPHEN 500 MG PO TABS
1000.00 | ORAL_TABLET | ORAL | Status: DC
Start: 2020-01-13 — End: 2020-01-13

## 2020-01-13 MED ORDER — CELECOXIB 200 MG PO CAPS
200.00 | ORAL_CAPSULE | ORAL | Status: DC
Start: 2020-01-13 — End: 2020-01-13

## 2020-01-13 MED ORDER — DULOXETINE HCL 60 MG PO CPEP
120.00 | ORAL_CAPSULE | ORAL | Status: DC
Start: 2020-01-14 — End: 2020-01-13

## 2020-01-13 MED ORDER — PREGABALIN 75 MG PO CAPS
75.00 | ORAL_CAPSULE | ORAL | Status: DC
Start: 2020-01-14 — End: 2020-01-13

## 2020-01-13 MED ORDER — SENNOSIDES-DOCUSATE SODIUM 8.6-50 MG PO TABS
2.00 | ORAL_TABLET | ORAL | Status: DC
Start: 2020-01-13 — End: 2020-01-13

## 2020-01-13 MED ORDER — TOPIRAMATE 25 MG PO TABS
50.00 | ORAL_TABLET | ORAL | Status: DC
Start: 2020-01-13 — End: 2020-01-13

## 2020-01-13 MED ORDER — BENZOCAINE-MENTHOL 6-10 MG MT LOZG
1.00 | LOZENGE | OROMUCOSAL | Status: DC
Start: ? — End: 2020-01-13

## 2020-01-13 MED ORDER — TIZANIDINE HCL 4 MG PO TABS
4.00 | ORAL_TABLET | ORAL | Status: DC
Start: ? — End: 2020-01-13

## 2020-01-13 MED ORDER — ENOXAPARIN SODIUM 40 MG/0.4ML ~~LOC~~ SOLN
40.00 | SUBCUTANEOUS | Status: DC
Start: 2020-01-14 — End: 2020-01-13

## 2020-01-13 MED ORDER — BISACODYL 10 MG RE SUPP
10.00 | RECTAL | Status: DC
Start: ? — End: 2020-01-13

## 2020-01-13 MED ORDER — RIFAMPIN 300 MG PO CAPS
300.00 | ORAL_CAPSULE | ORAL | Status: DC
Start: 2020-01-13 — End: 2020-01-13

## 2020-01-20 ENCOUNTER — Inpatient Hospital Stay: Payer: Medicare Other | Admitting: Sports Medicine

## 2020-02-08 ENCOUNTER — Other Ambulatory Visit: Payer: Self-pay | Admitting: Sports Medicine

## 2020-02-08 DIAGNOSIS — I10 Essential (primary) hypertension: Secondary | ICD-10-CM

## 2020-03-08 ENCOUNTER — Ambulatory Visit: Payer: Medicare Other | Admitting: Sports Medicine

## 2020-03-12 ENCOUNTER — Encounter: Payer: Self-pay | Admitting: Sports Medicine

## 2020-03-12 ENCOUNTER — Other Ambulatory Visit: Payer: Self-pay

## 2020-03-12 ENCOUNTER — Ambulatory Visit (INDEPENDENT_AMBULATORY_CARE_PROVIDER_SITE_OTHER): Payer: Medicare Other | Admitting: Sports Medicine

## 2020-03-12 ENCOUNTER — Other Ambulatory Visit: Payer: Self-pay | Admitting: Sports Medicine

## 2020-03-12 VITALS — BP 148/85 | HR 84 | Ht 64.0 in | Wt 183.0 lb

## 2020-03-12 DIAGNOSIS — G8929 Other chronic pain: Secondary | ICD-10-CM

## 2020-03-12 DIAGNOSIS — M25572 Pain in left ankle and joints of left foot: Secondary | ICD-10-CM

## 2020-03-12 DIAGNOSIS — R7301 Impaired fasting glucose: Secondary | ICD-10-CM | POA: Diagnosis not present

## 2020-03-12 DIAGNOSIS — F331 Major depressive disorder, recurrent, moderate: Secondary | ICD-10-CM

## 2020-03-12 DIAGNOSIS — Z96652 Presence of left artificial knee joint: Secondary | ICD-10-CM | POA: Diagnosis not present

## 2020-03-12 DIAGNOSIS — I10 Essential (primary) hypertension: Secondary | ICD-10-CM | POA: Diagnosis not present

## 2020-03-12 LAB — POCT GLYCOSYLATED HEMOGLOBIN (HGB A1C): Hemoglobin A1C: 5.6 % (ref 4.0–5.6)

## 2020-03-12 MED ORDER — AMLODIPINE BESYLATE 5 MG PO TABS: 5.0000 mg | ORAL_TABLET | Freq: Every day | ORAL | 3 refills | Status: DC

## 2020-03-12 NOTE — Assessment & Plan Note (Signed)
Overall doing okay, still with some pain and effusion, further management per orthopedic surgery.

## 2020-03-12 NOTE — Progress Notes (Signed)
    Procedures performed today:    None.  Independent interpretation of notes and tests performed by another provider:   None.  Brief History, Exam, Impression, and Recommendations:    Benign essential hypertension Currently uncontrolled, no headaches, visual changes, chest pain, she is taking lisinopril/HCTZ. Adding amlodipine 5 mg daily, recheck blood pressure in 2 weeks in a nurse visit check.  Left knee revision arthroplasty secondary to infection Overall doing okay, still with some pain and effusion, further management per orthopedic surgery.  Chronic pain of left ankle For 6 weeks now this pleasant 44 year old female has had severe pain in her ankle, this occurred after her revision arthroplasty. She is had x-rays that were unremarkable, proceeding with left ankle MRI.    ___________________________________________ Gwen Her. Dianah Field, M.D., ABFM., CAQSM. Primary Care and [place] Instructor of [place] of [facility] of Medicine

## 2020-03-12 NOTE — Assessment & Plan Note (Signed)
For 6 weeks now this pleasant 44 year old female has had severe pain in her ankle, this occurred after her revision arthroplasty. She is had x-rays that were unremarkable, proceeding with left ankle MRI.

## 2020-03-12 NOTE — Assessment & Plan Note (Signed)
Currently uncontrolled, no headaches, visual changes, chest pain, she is taking lisinopril/HCTZ. Adding amlodipine 5 mg daily, recheck blood pressure in 2 weeks in a nurse visit check.

## 2020-03-15 ENCOUNTER — Ambulatory Visit (INDEPENDENT_AMBULATORY_CARE_PROVIDER_SITE_OTHER): Payer: Medicare Other

## 2020-03-15 ENCOUNTER — Other Ambulatory Visit: Payer: Self-pay

## 2020-03-15 DIAGNOSIS — G8929 Other chronic pain: Secondary | ICD-10-CM

## 2020-03-15 DIAGNOSIS — M25572 Pain in left ankle and joints of left foot: Secondary | ICD-10-CM

## 2020-03-23 ENCOUNTER — Other Ambulatory Visit: Payer: Self-pay | Admitting: Sports Medicine

## 2020-03-26 ENCOUNTER — Ambulatory Visit: Payer: Medicare Other

## 2020-04-07 ENCOUNTER — Encounter: Payer: Self-pay | Admitting: Sports Medicine

## 2020-04-07 ENCOUNTER — Ambulatory Visit (INDEPENDENT_AMBULATORY_CARE_PROVIDER_SITE_OTHER): Payer: Medicare Other | Admitting: Sports Medicine

## 2020-04-07 ENCOUNTER — Other Ambulatory Visit: Payer: Self-pay

## 2020-04-07 DIAGNOSIS — F314 Bipolar disorder, current episode depressed, severe, without psychotic features: Secondary | ICD-10-CM | POA: Diagnosis not present

## 2020-04-07 DIAGNOSIS — G43909 Migraine, unspecified, not intractable, without status migrainosus: Secondary | ICD-10-CM

## 2020-04-07 DIAGNOSIS — M25572 Pain in left ankle and joints of left foot: Secondary | ICD-10-CM

## 2020-04-07 DIAGNOSIS — G8929 Other chronic pain: Secondary | ICD-10-CM

## 2020-04-07 DIAGNOSIS — Z96652 Presence of left artificial knee joint: Secondary | ICD-10-CM

## 2020-04-07 DIAGNOSIS — N179 Acute kidney failure, unspecified: Secondary | ICD-10-CM

## 2020-04-07 DIAGNOSIS — E781 Pure hyperglyceridemia: Secondary | ICD-10-CM

## 2020-04-07 DIAGNOSIS — G4701 Insomnia due to medical condition: Secondary | ICD-10-CM

## 2020-04-07 LAB — COMPLETE METABOLIC PANEL WITH GFR
AG Ratio: 1.7 (calc) (ref 1.0–2.5)
ALT: 26 U/L (ref 6–29)
AST: 20 U/L (ref 10–30)
Albumin: 4.9 g/dL (ref 3.6–5.1)
Alkaline phosphatase (APISO): 88 U/L (ref 31–125)
BUN/Creatinine Ratio: 15 (calc) (ref 6–22)
BUN: 20 mg/dL (ref 7–25)
CO2: 29 mmol/L (ref 20–32)
Calcium: 10.5 mg/dL — ABNORMAL HIGH (ref 8.6–10.2)
Chloride: 102 mmol/L (ref 98–110)
Creat: 1.36 mg/dL — ABNORMAL HIGH (ref 0.50–1.10)
GFR, Est African American: 55 mL/min/{1.73_m2} — ABNORMAL LOW (ref >=60)
GFR, Est Non African American: 47 mL/min/{1.73_m2} — ABNORMAL LOW (ref >=60)
Globulin: 2.9 g/dL (calc) (ref 1.9–3.7)
Glucose, Bld: 107 mg/dL — ABNORMAL HIGH (ref 65–99)
Potassium: 5.3 mmol/L (ref 3.5–5.3)
Sodium: 138 mmol/L (ref 135–146)
Total Bilirubin: 0.3 mg/dL (ref 0.2–1.2)
Total Protein: 7.8 g/dL (ref 6.1–8.1)

## 2020-04-07 LAB — CBC
HCT: 41 % (ref 35.0–45.0)
Hemoglobin: 13.3 g/dL (ref 11.7–15.5)
MCH: 26.2 pg — ABNORMAL LOW (ref 27.0–33.0)
MCHC: 32.4 g/dL (ref 32.0–36.0)
MCV: 80.9 fL (ref 80.0–100.0)
MPV: 10.2 fL (ref 7.5–12.5)
Platelets: 314 10*3/uL (ref 140–400)
RBC: 5.07 10*6/uL (ref 3.80–5.10)
RDW: 13.8 % (ref 11.0–15.0)
WBC: 5.5 10*3/uL (ref 3.8–10.8)

## 2020-04-07 LAB — SEDIMENTATION RATE: Sed Rate: 6 mm/h (ref 0–20)

## 2020-04-07 LAB — LIPID PANEL W/REFLEX DIRECT LDL
Cholesterol: 180 mg/dL (ref <200)
HDL: 44 mg/dL — ABNORMAL LOW (ref >=50)
LDL Cholesterol (Calc): 105 mg/dL (calc) — ABNORMAL HIGH
Non-HDL Cholesterol (Calc): 136 mg/dL (calc) — ABNORMAL HIGH (ref <130)
Total CHOL/HDL Ratio: 4.1 (calc) (ref <5.0)
Triglycerides: 191 mg/dL — ABNORMAL HIGH (ref <150)

## 2020-04-07 MED ORDER — ZALEPLON 5 MG PO CAPS: 5.0000 mg | ORAL_CAPSULE | Freq: Every evening | ORAL | 0 refills | Status: DC | PRN

## 2020-04-07 MED ORDER — TOPIRAMATE 50 MG PO TABS: ORAL_TABLET | ORAL | 1 refills | Status: DC

## 2020-04-07 NOTE — Progress Notes (Addendum)
    Procedures performed today:    None.  Independent interpretation of notes and tests performed by another provider:   None.  Brief History, Exam, Impression, and Recommendations:    Chronic pain of left ankle Ultimately MRI showed some ankle osteoarthritis as well as a few other lesser findings. Pain is tolerable at this point, we can certainly do a tibiotalar joint injection if worsening of pain to the point of intolerability.  Left knee revision arthroplasty secondary to infection Geralyn had a revision arthroplasty secondary to infection, she is being managed by orthopedic surgery, she does have significant pain, currently on hydrocodone. Sounds like she has antibiotic beads in place, not exactly sure of the follow-up plan but still having severe pain. This is causing trouble sleeping, I am going to add something to help this.  Bipolar I disorder, severe, current or most recent episode depressed, with rapid cycling ([place]) Previously diagnosed bipolar 1 disorder with rare episodes of mania, symptoms currently well controlled on Cymbalta, BuSpar, no change in plan needed. She does have a behavioral therapist in [place].  Insomnia due to medical condition Kaula has insomnia secondary to her pain, she is on hydrocodone, still with difficulty sleeping, had hallucinations with Ambien, trying Sonata now. We can do a virtual visit in 2 weeks to see how things are going for dose titration or medication adjustment.  AKI (acute kidney injury) ([place]) There has been a bump in renal function, she did have some acute kidney injury during her hospitalization. Creatinine was as high as 1.46. Fractional excretion of sodium was 1.9% consistent with intrarenal injury. At this point she needs to avoid NSAIDs, keep hydrated, and we can recheck this in a month. Orders placed, she just needs to get the labs done.    ___________________________________________ Gwen Her. Dianah Field, M.D., ABFM.,  CAQSM. Primary Care and [place] Instructor of [place] of [facility] of Medicine

## 2020-04-07 NOTE — Addendum Note (Signed)
Addended by: Silverio Decamp on: 04/07/2020 10:43 AM   Modules accepted: Orders

## 2020-04-07 NOTE — Assessment & Plan Note (Signed)
Evelyn Sanchez has insomnia secondary to her pain, she is on hydrocodone, still with difficulty sleeping, had hallucinations with Ambien, trying Sonata now. We can do a virtual visit in 2 weeks to see how things are going for dose titration or medication adjustment.

## 2020-04-07 NOTE — Assessment & Plan Note (Signed)
Ultimately MRI showed some ankle osteoarthritis as well as a few other lesser findings. Pain is tolerable at this point, we can certainly do a tibiotalar joint injection if worsening of pain to the point of intolerability.

## 2020-04-07 NOTE — Assessment & Plan Note (Signed)
Evelyn Sanchez had a revision arthroplasty secondary to infection, she is being managed by orthopedic surgery, she does have significant pain, currently on hydrocodone. Sounds like she has antibiotic beads in place, not exactly sure of the follow-up plan but still having severe pain. This is causing trouble sleeping, I am going to add something to help this.

## 2020-04-07 NOTE — Assessment & Plan Note (Signed)
Previously diagnosed bipolar 1 disorder with rare episodes of mania, symptoms currently well controlled on Cymbalta, BuSpar, no change in plan needed. She does have a behavioral therapist in [place].

## 2020-04-08 NOTE — Addendum Note (Signed)
Addended by: Silverio Decamp on: 04/08/2020 08:49 AM   Modules accepted: Orders

## 2020-04-08 NOTE — Assessment & Plan Note (Signed)
There has been a bump in renal function, she did have some acute kidney injury during her hospitalization. Creatinine was as high as 1.46. Fractional excretion of sodium was 1.9% consistent with intrarenal injury. At this point she needs to avoid NSAIDs, keep hydrated, and we can recheck this in a month. Orders placed, she just needs to get the labs done.

## 2020-04-17 ENCOUNTER — Other Ambulatory Visit: Payer: Self-pay | Admitting: Sports Medicine

## 2020-04-17 DIAGNOSIS — F314 Bipolar disorder, current episode depressed, severe, without psychotic features: Secondary | ICD-10-CM

## 2020-04-21 ENCOUNTER — Encounter: Payer: Self-pay | Admitting: Sports Medicine

## 2020-04-21 ENCOUNTER — Telehealth (INDEPENDENT_AMBULATORY_CARE_PROVIDER_SITE_OTHER): Payer: Medicare Other | Admitting: Sports Medicine

## 2020-04-21 DIAGNOSIS — G4701 Insomnia due to medical condition: Secondary | ICD-10-CM

## 2020-04-21 MED ORDER — ZALEPLON 10 MG PO CAPS: 10.0000 mg | ORAL_CAPSULE | Freq: Every evening | ORAL | 3 refills | Status: DC | PRN

## 2020-04-21 NOTE — Assessment & Plan Note (Signed)
Evelyn Sanchez and I connected on the phone again today, she has insomnia secondary to the pain of her revision arthroplasty, she is currently on hydrocodone, still with difficulty sleeping, she had hallucinations on Ambien and now 5 mg of Sonata has not been efficacious, I am going to increase to 10 mg of Sonata with the option to go up to 20 mg at night if needed, follow-up with me in 2 weeks.

## 2020-04-21 NOTE — Progress Notes (Signed)
   Virtual Visit via Telephone   I connected with  Evelyn Sanchez  on 04/21/20 by telephone/telehealth and verified that I am speaking with the correct person using two identifiers.   I discussed the limitations, risks, security and privacy concerns of performing an evaluation and management service by telephone, including the higher likelihood of inaccurate diagnosis and treatment, and the availability of in person appointments.  We also discussed the likely need of an additional face to face encounter for complete and high quality delivery of care.  I also discussed with the patient that there may be a patient responsible charge related to this service. The patient expressed understanding and wishes to proceed.  Provider location is in medical facility. Patient location is at their home, different from provider location. People involved in care of the patient during this telehealth encounter were myself, my nurse/medical assistant, and my front office/scheduling team member.  Review of Systems: No fevers, chills, night sweats, weight loss, chest pain, or shortness of breath.   Objective Findings:    General: Speaking full sentences, no audible heavy breathing.  Sounds alert and appropriately interactive.    Independent interpretation of tests performed by another provider:   None.  Brief History, Exam, Impression, and Recommendations:    Insomnia due to medical condition Evelyn Sanchez and I connected on the phone again today, she has insomnia secondary to the pain of her revision arthroplasty, she is currently on hydrocodone, still with difficulty sleeping, she had hallucinations on Ambien and now 5 mg of Sonata has not been efficacious, I am going to increase to 10 mg of Sonata with the option to go up to 20 mg at night if needed, follow-up with me in 2 weeks.   I discussed the above assessment and treatment plan with the patient. The patient was provided an opportunity to ask questions and all  were answered. The patient agreed with the plan and demonstrated an understanding of the instructions.   The patient was advised to call back or seek an in-person evaluation if the symptoms worsen or if the condition fails to improve as anticipated.   I provided 30 minutes of face to face and non-face-to-face time during this encounter date, time was needed to gather information, review chart, records, communicate/coordinate with staff remotely, as well as complete documentation.   ___________________________________________ Gwen Her. Dianah Field, M.D., ABFM., CAQSM. Primary Care and Sports Medicine [facility]  Adjunct Professor of [place] of [facility] of Medicine

## 2020-06-06 ENCOUNTER — Other Ambulatory Visit: Payer: Self-pay | Admitting: Sports Medicine

## 2020-06-06 DIAGNOSIS — I10 Essential (primary) hypertension: Secondary | ICD-10-CM

## 2020-06-09 ENCOUNTER — Other Ambulatory Visit: Payer: Self-pay | Admitting: Sports Medicine

## 2020-06-28 ENCOUNTER — Other Ambulatory Visit: Payer: Self-pay | Admitting: Sports Medicine

## 2020-06-28 DIAGNOSIS — Z1231 Encounter for screening mammogram for malignant neoplasm of breast: Secondary | ICD-10-CM

## 2020-07-21 ENCOUNTER — Ambulatory Visit: Payer: Medicare Other

## 2020-07-23 ENCOUNTER — Ambulatory Visit (INDEPENDENT_AMBULATORY_CARE_PROVIDER_SITE_OTHER): Payer: Medicare Other | Admitting: Sports Medicine

## 2020-07-23 DIAGNOSIS — Z23 Encounter for immunization: Secondary | ICD-10-CM | POA: Diagnosis not present

## 2020-07-23 DIAGNOSIS — R200 Anesthesia of skin: Secondary | ICD-10-CM

## 2020-07-23 DIAGNOSIS — R202 Paresthesia of skin: Secondary | ICD-10-CM

## 2020-07-23 DIAGNOSIS — M5030 Other cervical disc degeneration, unspecified cervical region: Secondary | ICD-10-CM | POA: Insufficient documentation

## 2020-07-23 MED ORDER — GABAPENTIN 300 MG PO CAPS: ORAL_CAPSULE | ORAL | 3 refills | Status: DC

## 2020-07-23 NOTE — Progress Notes (Signed)
° ° °  Procedures performed today:    None.  Independent interpretation of notes and tests performed by another provider:   None.  Brief History, Exam, Impression, and Recommendations:    Numbness and tingling of upper and lower extremities of both sides Evelyn Sanchez is having worsening paresthesias in her hands and feet. We are going to do a metabolic work-up for peripheral neuropathy, I would also like an updated nerve conduction/EMG, the last one was in 2016 and fairly unrevealing. Adding gabapentin. Return to see me in 4 weeks.    ___________________________________________ Gwen Her. Dianah Field, M.D., ABFM., CAQSM. Primary Care and [place] Instructor of [place] of [facility] of Medicine

## 2020-07-23 NOTE — Assessment & Plan Note (Signed)
Floy is having worsening paresthesias in her hands and feet. We are going to do a metabolic work-up for peripheral neuropathy, I would also like an updated nerve conduction/EMG, the last one was in 2016 and fairly unrevealing. Adding gabapentin. Return to see me in 4 weeks.

## 2020-07-28 ENCOUNTER — Other Ambulatory Visit: Payer: Self-pay

## 2020-07-28 ENCOUNTER — Ambulatory Visit (INDEPENDENT_AMBULATORY_CARE_PROVIDER_SITE_OTHER): Payer: Medicare Other

## 2020-07-28 DIAGNOSIS — Z1231 Encounter for screening mammogram for malignant neoplasm of breast: Secondary | ICD-10-CM

## 2020-08-03 LAB — COMPREHENSIVE METABOLIC PANEL
AG Ratio: 2 (calc) (ref 1.0–2.5)
ALT: 17 U/L (ref 6–29)
AST: 22 U/L (ref 10–35)
Albumin: 4.6 g/dL (ref 3.6–5.1)
Alkaline phosphatase (APISO): 72 U/L (ref 31–125)
BUN/Creatinine Ratio: 20 (calc) (ref 6–22)
BUN: 24 mg/dL (ref 7–25)
CO2: 28 mmol/L (ref 20–32)
Calcium: 10 mg/dL (ref 8.6–10.2)
Chloride: 105 mmol/L (ref 98–110)
Creat: 1.23 mg/dL — ABNORMAL HIGH (ref 0.50–1.10)
Globulin: 2.3 g/dL (calc) (ref 1.9–3.7)
Glucose, Bld: 90 mg/dL (ref 65–139)
Potassium: 4.9 mmol/L (ref 3.5–5.3)
Sodium: 139 mmol/L (ref 135–146)
Total Bilirubin: 0.2 mg/dL (ref 0.2–1.2)
Total Protein: 6.9 g/dL (ref 6.1–8.1)

## 2020-08-03 LAB — CBC
HCT: 37.4 % (ref 35.0–45.0)
Hemoglobin: 12.4 g/dL (ref 11.7–15.5)
MCH: 29.5 pg (ref 27.0–33.0)
MCHC: 33.2 g/dL (ref 32.0–36.0)
MCV: 88.8 fL (ref 80.0–100.0)
MPV: 9.7 fL (ref 7.5–12.5)
Platelets: 320 10*3/uL (ref 140–400)
RBC: 4.21 10*6/uL (ref 3.80–5.10)
RDW: 14.2 % (ref 11.0–15.0)
WBC: 7 10*3/uL (ref 3.8–10.8)

## 2020-08-03 LAB — CHROMIUM LEVEL: Chromium: <0.5 mcg/L (ref <=1.2)

## 2020-08-03 LAB — HEMOGLOBIN A1C
Hgb A1c MFr Bld: 5.6 % of total Hgb (ref <5.7)
Mean Plasma Glucose: 114 (calc)
eAG (mmol/L): 6.3 (calc)

## 2020-08-03 LAB — PROTEIN ELECTROPHORESIS, SERUM, WITH REFLEX
Albumin ELP: 4.7 g/dL (ref 3.8–4.8)
Alpha 1: 0.3 g/dL (ref 0.2–0.3)
Alpha 2: 0.7 g/dL (ref 0.5–0.9)
Beta 2: 0.3 g/dL (ref 0.2–0.5)
Beta Globulin: 0.5 g/dL (ref 0.4–0.6)
Gamma Globulin: 0.9 g/dL (ref 0.8–1.7)
Total Protein: 7.3 g/dL (ref 6.1–8.1)

## 2020-08-03 LAB — HEAVY METALS PANEL, BLOOD
Arsenic: <3 mcg/L (ref <23)
Lead: 1 ug/dL (ref <5)
Mercury, B: <4 mcg/L (ref <=10)

## 2020-08-03 LAB — TSH: TSH: 2.49 mIU/L

## 2020-08-03 LAB — COBALT, SERUM/PLASMA: Cobalt, Serum/Plasma: 0.4 mcg/L (ref 0.1–0.4)

## 2020-08-03 LAB — VITAMIN B12: Vitamin B-12: 420 pg/mL (ref 200–1100)

## 2020-08-03 LAB — METHYLMALONIC ACID, SERUM: Methylmalonic Acid, Quant: 244 nmol/L (ref 87–318)

## 2020-08-15 ENCOUNTER — Other Ambulatory Visit: Payer: Self-pay | Admitting: Sports Medicine

## 2020-08-15 DIAGNOSIS — I10 Essential (primary) hypertension: Secondary | ICD-10-CM

## 2020-08-20 ENCOUNTER — Other Ambulatory Visit: Payer: Self-pay

## 2020-08-20 ENCOUNTER — Ambulatory Visit (INDEPENDENT_AMBULATORY_CARE_PROVIDER_SITE_OTHER): Payer: Medicare Other | Admitting: Sports Medicine

## 2020-08-20 DIAGNOSIS — M47816 Spondylosis without myelopathy or radiculopathy, lumbar region: Secondary | ICD-10-CM | POA: Diagnosis not present

## 2020-08-20 NOTE — Assessment & Plan Note (Signed)
Evelyn Sanchez returns, she is a pleasant 44 year old female with chronic multifactorial low back pain. She has had an extensive work-up and treatment thus far, MRIs that showed predominantly facet arthritis, discs looked really good, she has had facet injections, medial branch blocks, and radiofrequency ablation with persistent pain, she is working with pain management and is on duloxetine, tizanidine, hydrocodone, gabapentin. She has had formal physical therapy. Unfortunately continues to have axial back pain, not so much radicular. She is not interested in any more injections, she is not interested in a spinal cord stimulator, at this point I think we have reached the limit of modern medicine with regards to her back pain. I would like Dr. Zigmund Daniel to try some thoracolumbar osteopathic manipulation, she understands that his role will not be to provide any medication. She has an appointment for her nerve conduction/EMG set up in December, I like to see her back after maybe a month or 6 weeks of manipulations. Certainly acupuncture may also be an option in the future.

## 2020-08-20 NOTE — Progress Notes (Signed)
    Procedures performed today:    None.  Independent interpretation of notes and tests performed by another provider:   I personally reviewed her lumbar spine MRI which shows mild lower lumbar facet arthritis, left worse than right, discs look great.  Brief History, Exam, Impression, and Recommendations:    Lumbar facet arthropathy ([facility]) Evelyn Sanchez returns, she is a pleasant 44 year old female with chronic multifactorial low back pain. She has had an extensive work-up and treatment thus far, MRIs that showed predominantly facet arthritis, discs looked really good, she has had facet injections, medial branch blocks, and radiofrequency ablation with persistent pain, she is working with pain management and is on duloxetine, tizanidine, hydrocodone, gabapentin. She has had formal physical therapy. Unfortunately continues to have axial back pain, not so much radicular. She is not interested in any more injections, she is not interested in a spinal cord stimulator, at this point I think we have reached the limit of modern medicine with regards to her back pain. I would like Dr. Zigmund Daniel to try some thoracolumbar osteopathic manipulation, she understands that his role will not be to provide any medication. She has an appointment for her nerve conduction/EMG set up in December, I like to see her back after maybe a month or 6 weeks of manipulations. Certainly acupuncture may also be an option in the future.    ___________________________________________ Gwen Her. Dianah Field, M.D., ABFM., CAQSM. Primary Care and [place] Instructor of [place] of [facility] of Medicine

## 2020-08-25 ENCOUNTER — Ambulatory Visit (INDEPENDENT_AMBULATORY_CARE_PROVIDER_SITE_OTHER): Payer: Medicare Other | Admitting: Family Medicine

## 2020-08-25 ENCOUNTER — Encounter: Payer: Self-pay | Admitting: Family Medicine

## 2020-08-25 DIAGNOSIS — M9903 Segmental and somatic dysfunction of lumbar region: Secondary | ICD-10-CM | POA: Diagnosis not present

## 2020-08-25 DIAGNOSIS — M2170 Unequal limb length (acquired), unspecified site: Secondary | ICD-10-CM | POA: Insufficient documentation

## 2020-08-25 NOTE — Assessment & Plan Note (Signed)
Mild leg length discrepancy.  Recommend she add 1/4 inch heel lift to R shoe.

## 2020-08-25 NOTE — Patient Instructions (Signed)
Apply ice and drink plenty of fluids tonight.  Continue current medications Try using a 1/4" heel lift in your R shoe See me again in 1 week.

## 2020-08-25 NOTE — Assessment & Plan Note (Signed)
OMT treatment to lumbar spine completed today. I think she will benefit more from gentle manipulation rather than HVLA techniques for her chronic pain.  Discussed expectations and goals.  I let her know that this is unlikely to make her pain free, she expresses understanding.  Goal is to increase mobility and help with management of her pain.  We'll plan to see her again in 1 week for re-treatment.  I advised her to hydrate well and ice area tonight.

## 2020-08-25 NOTE — Progress Notes (Signed)
Evelyn Sanchez - 44 y.o. female MRN [id number]  Date of birth: 1976-06-21  Subjective Chief Complaint  Patient presents with  . Back Pain    HPI Evelyn Sanchez is a pleasant 44 y.o. female here today for OMT treatment of low back pain.  She was kindly referred by Dr. Dianah Field. She has tried numerous treatments for management of her chronic back pain.  Her back pain is more axial without radicular symptoms.  She recently had facet ablation but feels like pain was worsened after this.  She has not been interested in pursuing spinal stimulator.   ROS:  A comprehensive ROS was completed and negative except as noted per HPI   Allergies  Allergen Reactions  . Nickel Rash    Earrings cause redness and rash Earrings cause redness and rash   . Ambien [Zolpidem Tartrate] Other (See Comments)    hallucinations    Past Medical History:  Diagnosis Date  . Abnormal pap 1999   colpo  . Abnormal uterine bleeding 05/02/2012  . Anxiety   . Arthritis of both knees 2011  . Benign essential hypertension 10/22/2017  . Bilateral hand numbness 03/02/2016  . Bipolar I disorder, severe, current or most recent episode depressed, with rapid cycling ([place]) 06/11/2014   07/05/2018 PHQ 9 = 20, GAD7 = 15 08/02/2018 PHQ 9 = 14, GAD7 = 18 09/02/2018 PHQ 9 = 7, GAD7 = 7 09/30/2018 PHQ 9 = 8, GAD 7 = 8 06/10/2019 PHQ 9 = 8, GAD 7 = 16  . Chest pain, typical 09/30/2018  . Chlamydia   . Chronic pain syndrome 04/06/2016  . Depression   . Family history of ovarian cancer 05/02/2012  . Femoral neuropathy of left lower extremity 11/26/2014   NCV/EMG: Chronic neurogenic changes to left vastus lateralis, could be old L4 radiculopathy, femoral neuropathy, focal muscle damage.  No other evidence of radiculopathy.   . Fracture of coronoid process of ulna, left, closed 11/12/2014  . Ganglion cyst of flexor tendon sheath of finger, right 07/05/2018  . Ganglion cyst of wrist 01/01/2014  . GERD (gastroesophageal reflux disease)   . Hair  loss 06/10/2019  . Hypertension   . Lateral epicondylitis of right elbow 11/15/2016  . Lumbar facet arthropathy 03/08/2017  . Malignant melanoma ([place]) 11/19/2017  . Melanoma ([place])   . Migraine without status migrainosus, not intractable 03/19/2014   Overview:  Last Assessment & Plan:  Per patient request referral to neurology.  . Migraines   . Obesity 02/03/2014  . Ovarian cyst   . Periapical abscess 01/08/2019  . Pneumonia   . Post traumatic stress disorder 03/09/2012  . Premenstrual tension syndrome 02/10/2014   Overview:  Overview:  OCPs started 02/10/14  . Primary osteoarthritis of left knee 01/01/2014  . Severe acute respiratory syndrome coronavirus 2 (SARS-CoV-2) detected 10/14/2018  . Shivering 03/05/2019  . Trichomonas   . Vaginal Pap smear, abnormal   . Weakness of both legs 12/17/2012    Past Surgical History:  Procedure Laterality Date  . ABLATION    . APPENDECTOMY  1996  . KNEE ARTHROSCOPY  1991   Left Kneex2  . LYMPH NODE BIOPSY Left 12/11/2017   Procedure: SENTINEL LYMPH NODE MAPPING  BIOPSY;  Surgeon: Stark Klein, MD;  Location: [facility];  Service: General;  Laterality: Left;  Marland Kitchen MELANOMA EXCISION Left 12/11/2017   Procedure: EXCISION OF PRIOR SCAR LEFT SHOULDER MELANOMA WITH CLOSURE;  Surgeon: Stark Klein, MD;  Location: [facility];  Service: General;  Laterality: Left;  .  Tonsilectomy  1984  . TONSILLECTOMY    . TUBAL LIGATION  1999  . TUBAL LIGATION    . TUBOPLASTY / [place]   Four times between 1980-1990    Social History   Socioeconomic History  . Marital status: Married    Spouse name: Not on file  . Number of children: Not on file  . Years of education: Not on file  . Highest education level: Not on file  Occupational History  . Not on file  Tobacco Use  . Smoking status: Former Smoker    Packs/day: 0.50    Years: 10.00    Pack years: 5.00    Types: Cigarettes    Quit date: 11/08/2009    Years since quitting: 10.8  . Smokeless tobacco:  Never Used  Vaping Use  . Vaping Use: Never used  Substance and Sexual Activity  . Alcohol use: No  . Drug use: No  . Sexual activity: Yes    Partners: Male    Birth control/protection: Other-see comments    Comment: Tubal Ligation  Other Topics Concern  . Not on file  Social History Narrative  . Not on file   Social Determinants of Health   Financial Resource Strain:   . Difficulty of Paying Living Expenses: Not on file  Food Insecurity:   . Worried About Charity fundraiser in the Last Year: Not on file  . Ran Out of Food in the Last Year: Not on file  Transportation Needs:   . Lack of Transportation (Medical): Not on file  . Lack of Transportation (Non-Medical): Not on file  Physical Activity:   . Days of Exercise per Week: Not on file  . Minutes of Exercise per Session: Not on file  Stress:   . Feeling of Stress : Not on file  Social Connections:   . Frequency of Communication with Friends and Family: Not on file  . Frequency of Social Gatherings with Friends and Family: Not on file  . Attends Religious Services: Not on file  . Active Member of Clubs or Organizations: Not on file  . Attends Archivist Meetings: Not on file  . Marital Status: Not on file    Family History  Problem Relation Age of Onset  . Hypertension Maternal Grandfather   . Diabetes Maternal Grandfather   . Stroke Maternal Grandfather   . Hypertension Maternal Grandmother   . Cancer Maternal Grandmother   . Hypertension Paternal Grandfather   . Diabetes Paternal Grandfather   . Hypertension Paternal Grandmother   . Hypertension Mother   . Depression Mother   . Diabetes type II Mother   . Diabetes Mother   . Heart attack Father   . Hypertension Father   . Diabetes type II Father   . Diabetes Father   . Hyperlipidemia Father   . Depression Sister   . Diabetes type II Sister   . Diabetes Sister   . Hyperlipidemia Brother   . Cancer Maternal Aunt   . Alcohol abuse Maternal  Uncle   . Alcohol abuse Paternal Uncle     Health Maintenance  Topic Date Due  . Hepatitis C Screening  04/07/2021 (Originally 1976-06-13)  . HIV Screening  04/07/2021 (Originally 04/11/1990)  . COVID-19 Vaccine (1) 05/16/2021 (Originally 04/12/1987)  . PAP SMEAR-Modifier  06/16/2022  . TETANUS/TDAP  12/13/2026  . INFLUENZA VACCINE  Completed     ----------------------------------------------------------------------------------------------------------------------------------------------------------------------------------------------------------------- Physical Exam BP 122/72 (BP Location: Right Arm, Patient Position: Sitting, Cuff  Size: Large)   Pulse 64   Wt 191 lb (86.6 kg)   SpO2 100%   BMI 32.79 kg/m   Physical Exam Constitutional:      Appearance: Normal appearance.  Musculoskeletal:     Comments: Tightness of lumbar paraspinals with ttp.  L5 Neutral, Rotated Right, sidebent Left  Slight leg length discrepancy with L leg ~1/4 inch longer.    Neurological:     Mental Status: She is alert.    OMT:  Soft tissue mobilization and myofascial release performed to lumbar spine initially.  Muscle energy techniques applied to lumbar spine isolating L5 level with improvement in tissue texture and overlying tenderness.  She tolerated well and reported some mild improvement in pain.   ------------------------------------------------------------------------------------------------------------------------------------------------------------------------------------------------------------------- Assessment and Plan  Somatic dysfunction of spine, lumbar OMT treatment to lumbar spine completed today. I think she will benefit more from gentle manipulation rather than HVLA techniques for her chronic pain.  Discussed expectations and goals.  I let her know that this is unlikely to make her pain free, she expresses understanding.  Goal is to increase mobility and help with management of her  pain.  We'll plan to see her again in 1 week for re-treatment.  I advised her to hydrate well and ice area tonight.    Leg length discrepancy Mild leg length discrepancy.  Recommend she add 1/4 inch heel lift to R shoe.     No orders of the defined types were placed in this encounter.   Return in about 1 week (around 09/01/2020) for OMT-Spine.    This visit occurred during the SARS-CoV-2 public health emergency.  Safety protocols were in place, including screening questions prior to the visit, additional usage of staff PPE, and extensive cleaning of exam room while observing appropriate contact time as indicated for disinfecting solutions.

## 2020-09-01 ENCOUNTER — Other Ambulatory Visit: Payer: Self-pay | Admitting: Sports Medicine

## 2020-09-01 ENCOUNTER — Ambulatory Visit: Payer: Medicare Other | Admitting: Family Medicine

## 2020-09-07 ENCOUNTER — Other Ambulatory Visit: Payer: Self-pay

## 2020-09-07 ENCOUNTER — Ambulatory Visit (INDEPENDENT_AMBULATORY_CARE_PROVIDER_SITE_OTHER): Payer: Medicare Other | Admitting: Family Medicine

## 2020-09-07 ENCOUNTER — Encounter: Payer: Self-pay | Admitting: Family Medicine

## 2020-09-07 DIAGNOSIS — M2170 Unequal limb length (acquired), unspecified site: Secondary | ICD-10-CM | POA: Diagnosis not present

## 2020-09-07 DIAGNOSIS — M9903 Segmental and somatic dysfunction of lumbar region: Secondary | ICD-10-CM

## 2020-09-07 NOTE — Assessment & Plan Note (Signed)
OMT treatment to lumbar spine completed today. We'll plan to see her again in 1 week for re-treatment.  I advised her to hydrate well and ice area tonight.

## 2020-09-07 NOTE — Progress Notes (Signed)
Evelyn Sanchez - 44 y.o. female MRN [id number]  Date of birth: 26-Aug-1976  Subjective Chief Complaint  Patient presents with  . Back Pain    HPI Evelyn Sanchez is a 44 y.o. female with chronic low back pain here today for OMT treatment.  She had initial treatment about 2 weeks ago.  She noted some improvement initially but then cared for her grandchildren the following day.  Lifting them seemed to make this worse again.  She is having some radiation of pain into the legs today.    ROS:  A comprehensive ROS was completed and negative except as noted per HPI  Allergies  Allergen Reactions  . Nickel Rash    Earrings cause redness and rash Earrings cause redness and rash   . Ambien [Zolpidem Tartrate] Other (See Comments)    hallucinations    Past Medical History:  Diagnosis Date  . Abnormal pap 1999   colpo  . Abnormal uterine bleeding 05/02/2012  . Anxiety   . Arthritis of both knees 2011  . Benign essential hypertension 10/22/2017  . Bilateral hand numbness 03/02/2016  . Bipolar I disorder, severe, current or most recent episode depressed, with rapid cycling ([place]) 06/11/2014   07/05/2018 PHQ 9 = 20, GAD7 = 15 08/02/2018 PHQ 9 = 14, GAD7 = 18 09/02/2018 PHQ 9 = 7, GAD7 = 7 09/30/2018 PHQ 9 = 8, GAD 7 = 8 06/10/2019 PHQ 9 = 8, GAD 7 = 16  . Chest pain, typical 09/30/2018  . Chlamydia   . Chronic pain syndrome 04/06/2016  . Depression   . Family history of ovarian cancer 05/02/2012  . Femoral neuropathy of left lower extremity 11/26/2014   NCV/EMG: Chronic neurogenic changes to left vastus lateralis, could be old L4 radiculopathy, femoral neuropathy, focal muscle damage.  No other evidence of radiculopathy.   . Fracture of coronoid process of ulna, left, closed 11/12/2014  . Ganglion cyst of flexor tendon sheath of finger, right 07/05/2018  . Ganglion cyst of wrist 01/01/2014  . GERD (gastroesophageal reflux disease)   . Hair loss 06/10/2019  . Hypertension   . Lateral epicondylitis of right  elbow 11/15/2016  . Lumbar facet arthropathy 03/08/2017  . Malignant melanoma ([place]) 11/19/2017  . Melanoma ([place])   . Migraine without status migrainosus, not intractable 03/19/2014   Overview:  Last Assessment & Plan:  Per patient request referral to neurology.  . Migraines   . Obesity 02/03/2014  . Ovarian cyst   . Periapical abscess 01/08/2019  . Pneumonia   . Post traumatic stress disorder 03/09/2012  . Premenstrual tension syndrome 02/10/2014   Overview:  Overview:  OCPs started 02/10/14  . Primary osteoarthritis of left knee 01/01/2014  . Severe acute respiratory syndrome coronavirus 2 (SARS-CoV-2) detected 10/14/2018  . Shivering 03/05/2019  . Trichomonas   . Vaginal Pap smear, abnormal   . Weakness of both legs 12/17/2012    Past Surgical History:  Procedure Laterality Date  . ABLATION    . APPENDECTOMY  1996  . KNEE ARTHROSCOPY  1991   Left Kneex2  . LYMPH NODE BIOPSY Left 12/11/2017   Procedure: SENTINEL LYMPH NODE MAPPING  BIOPSY;  Surgeon: Stark Klein, MD;  Location: [facility];  Service: General;  Laterality: Left;  Marland Kitchen MELANOMA EXCISION Left 12/11/2017   Procedure: EXCISION OF PRIOR SCAR LEFT SHOULDER MELANOMA WITH CLOSURE;  Surgeon: Stark Klein, MD;  Location: [facility];  Service: General;  Laterality: Left;  . Tonsilectomy  1984  . TONSILLECTOMY    .  TUBAL LIGATION  1999  . TUBAL LIGATION    . TUBOPLASTY / [place]   Four times between 1980-1990    Social History   Socioeconomic History  . Marital status: Married    Spouse name: Not on file  . Number of children: Not on file  . Years of education: Not on file  . Highest education level: Not on file  Occupational History  . Not on file  Tobacco Use  . Smoking status: Former Smoker    Packs/day: 0.50    Years: 10.00    Pack years: 5.00    Types: Cigarettes    Quit date: 11/08/2009    Years since quitting: 10.8  . Smokeless tobacco: Never Used  Vaping Use  . Vaping Use: Never used  Substance and  Sexual Activity  . Alcohol use: No  . Drug use: No  . Sexual activity: Yes    Partners: Male    Birth control/protection: Other-see comments    Comment: Tubal Ligation  Other Topics Concern  . Not on file  Social History Narrative  . Not on file   Social Determinants of Health   Financial Resource Strain:   . Difficulty of Paying Living Expenses: Not on file  Food Insecurity:   . Worried About Charity fundraiser in the Last Year: Not on file  . Ran Out of Food in the Last Year: Not on file  Transportation Needs:   . Lack of Transportation (Medical): Not on file  . Lack of Transportation (Non-Medical): Not on file  Physical Activity:   . Days of Exercise per Week: Not on file  . Minutes of Exercise per Session: Not on file  Stress:   . Feeling of Stress : Not on file  Social Connections:   . Frequency of Communication with Friends and Family: Not on file  . Frequency of Social Gatherings with Friends and Family: Not on file  . Attends Religious Services: Not on file  . Active Member of Clubs or Organizations: Not on file  . Attends Archivist Meetings: Not on file  . Marital Status: Not on file    Family History  Problem Relation Age of Onset  . Hypertension Maternal Grandfather   . Diabetes Maternal Grandfather   . Stroke Maternal Grandfather   . Hypertension Maternal Grandmother   . Cancer Maternal Grandmother   . Hypertension Paternal Grandfather   . Diabetes Paternal Grandfather   . Hypertension Paternal Grandmother   . Hypertension Mother   . Depression Mother   . Diabetes type II Mother   . Diabetes Mother   . Heart attack Father   . Hypertension Father   . Diabetes type II Father   . Diabetes Father   . Hyperlipidemia Father   . Depression Sister   . Diabetes type II Sister   . Diabetes Sister   . Hyperlipidemia Brother   . Cancer Maternal Aunt   . Alcohol abuse Maternal Uncle   . Alcohol abuse Paternal Uncle     Health Maintenance   Topic Date Due  . Hepatitis C Screening  04/07/2021 (Originally Sep 15, 1976)  . HIV Screening  04/07/2021 (Originally 04/11/1990)  . COVID-19 Vaccine (1) 05/16/2021 (Originally 04/12/1987)  . PAP SMEAR-Modifier  06/16/2022  . TETANUS/TDAP  12/13/2026  . INFLUENZA VACCINE  Completed     ----------------------------------------------------------------------------------------------------------------------------------------------------------------------------------------------------------------- Physical Exam BP 127/76   Pulse 76   Ht 5' 4"  (1.626 m)   Wt 188 lb (85.3 kg)  SpO2 98%   BMI 32.27 kg/m   Physical Exam Constitutional:      Appearance: Normal appearance.  Musculoskeletal:     Cervical back: Neck supple.     Comments:   Comments: Tightness of lumbar paraspinals with ttp.  L5 Neutral, Rotated Right, sidebent Left  Tight hamstrings  Slight leg length discrepancy with L leg ~1/4 inch longer.      Neurological:     Mental Status: She is alert.   OMT:  Soft tissue mobilization and myofascial release performed to lumbar spine initially.  Muscle energy techniques applied to lumbar spine isolating L5 level with improvement in tissue texture and overlying tenderness.  She tolerated well and reported some mild improvement in pain.   ------------------------------------------------------------------------------------------------------------------------------------------------------------------------------------------------------------------- Assessment and Plan  Somatic dysfunction of spine, lumbar OMT treatment to lumbar spine completed today. We'll plan to see her again in 1 week for re-treatment.  I advised her to hydrate well and ice area tonight.    Leg length discrepancy Provided with heel lift today.    No orders of the defined types were placed in this encounter.   No follow-ups on file.    This visit occurred during the SARS-CoV-2 public health emergency.   Safety protocols were in place, including screening questions prior to the visit, additional usage of staff PPE, and extensive cleaning of exam room while observing appropriate contact time as indicated for disinfecting solutions.

## 2020-09-07 NOTE — Assessment & Plan Note (Signed)
Provided with heel lift today.

## 2020-09-15 ENCOUNTER — Other Ambulatory Visit: Payer: Self-pay | Admitting: Sports Medicine

## 2020-09-15 DIAGNOSIS — E781 Pure hyperglyceridemia: Secondary | ICD-10-CM

## 2020-09-17 ENCOUNTER — Other Ambulatory Visit: Payer: Self-pay | Admitting: Sports Medicine

## 2020-09-17 ENCOUNTER — Ambulatory Visit: Payer: Medicare Other | Admitting: Family Medicine

## 2020-09-17 DIAGNOSIS — E781 Pure hyperglyceridemia: Secondary | ICD-10-CM

## 2020-09-22 ENCOUNTER — Encounter: Payer: Self-pay | Admitting: Family Medicine

## 2020-09-22 ENCOUNTER — Ambulatory Visit (INDEPENDENT_AMBULATORY_CARE_PROVIDER_SITE_OTHER): Payer: Medicare Other | Admitting: Family Medicine

## 2020-09-22 ENCOUNTER — Other Ambulatory Visit: Payer: Self-pay

## 2020-09-22 DIAGNOSIS — M9903 Segmental and somatic dysfunction of lumbar region: Secondary | ICD-10-CM | POA: Diagnosis not present

## 2020-09-22 NOTE — Patient Instructions (Signed)
Push fluids.  Try to stay active.  See me again in about 1 week.

## 2020-09-22 NOTE — Assessment & Plan Note (Signed)
OMT treatment to lumbar spine completed today. We'll plan to see her again in 1 week for re-treatment.  I advised her to hydrate well and ice area tonight.  If she is not having any improvement at that point I don't think she will benefit from continued treatment.

## 2020-09-22 NOTE — Progress Notes (Signed)
Evelyn Sanchez - 44 y.o. female MRN [id number]  Date of birth: Feb 22, 1976  Subjective Chief Complaint  Patient presents with  . Fall    HPI Evelyn Sanchez is a 44 y.o. female here today for follow-up OMT treatment for chronic low back pain.  Since her last visit she did have a fall after getting tangled with her dogs.  She did land on her back.  This seemed to exacerbate her pain again.  She does report that pain in her back seems to be a little bit worse after treatments and she is not sure she is getting a lot of benefit from this.  She would like to continue for couple more sessions to see if this may help.  She does feel like the insert for her shoe for leg length discrepancy has been helpful.  ROS:  A comprehensive ROS was completed and negative except as noted per HPI  Allergies  Allergen Reactions  . Nickel Rash    Earrings cause redness and rash Earrings cause redness and rash   . Ambien [Zolpidem Tartrate] Other (See Comments)    hallucinations    Past Medical History:  Diagnosis Date  . Abnormal pap 1999   colpo  . Abnormal uterine bleeding 05/02/2012  . Anxiety   . Arthritis of both knees 2011  . Benign essential hypertension 10/22/2017  . Bilateral hand numbness 03/02/2016  . Bipolar I disorder, severe, current or most recent episode depressed, with rapid cycling ([place]) 06/11/2014   07/05/2018 PHQ 9 = 20, GAD7 = 15 08/02/2018 PHQ 9 = 14, GAD7 = 18 09/02/2018 PHQ 9 = 7, GAD7 = 7 09/30/2018 PHQ 9 = 8, GAD 7 = 8 06/10/2019 PHQ 9 = 8, GAD 7 = 16  . Chest pain, typical 09/30/2018  . Chlamydia   . Chronic pain syndrome 04/06/2016  . Depression   . Family history of ovarian cancer 05/02/2012  . Femoral neuropathy of left lower extremity 11/26/2014   NCV/EMG: Chronic neurogenic changes to left vastus lateralis, could be old L4 radiculopathy, femoral neuropathy, focal muscle damage.  No other evidence of radiculopathy.   . Fracture of coronoid process of ulna, left, closed 11/12/2014  .  Ganglion cyst of flexor tendon sheath of finger, right 07/05/2018  . Ganglion cyst of wrist 01/01/2014  . GERD (gastroesophageal reflux disease)   . Hair loss 06/10/2019  . Hypertension   . Lateral epicondylitis of right elbow 11/15/2016  . Lumbar facet arthropathy 03/08/2017  . Malignant melanoma ([place]) 11/19/2017  . Melanoma ([employer])   . Migraine without status migrainosus, not intractable 03/19/2014   Overview:  Last Assessment & Plan:  Per patient request referral to neurology.  . Migraines   . Obesity 02/03/2014  . Ovarian cyst   . Periapical abscess 01/08/2019  . Pneumonia   . Post traumatic stress disorder 03/09/2012  . Premenstrual tension syndrome 02/10/2014   Overview:  Overview:  OCPs started 02/10/14  . Primary osteoarthritis of left knee 01/01/2014  . Severe acute respiratory syndrome coronavirus 2 (SARS-CoV-2) detected 10/14/2018  . Shivering 03/05/2019  . Trichomonas   . Vaginal Pap smear, abnormal   . Weakness of both legs 12/17/2012    Past Surgical History:  Procedure Laterality Date  . ABLATION    . APPENDECTOMY  1996  . KNEE ARTHROSCOPY  1991   Left Kneex2  . LYMPH NODE BIOPSY Left 12/11/2017   Procedure: SENTINEL LYMPH NODE MAPPING  BIOPSY;  Surgeon: Stark Klein, MD;  Location: [facility];  Service: General;  Laterality: Left;  Marland Kitchen MELANOMA EXCISION Left 12/11/2017   Procedure: EXCISION OF PRIOR SCAR LEFT SHOULDER MELANOMA WITH CLOSURE;  Surgeon: Stark Klein, MD;  Location: [facility];  Service: General;  Laterality: Left;  . Tonsilectomy  1984  . TONSILLECTOMY    . TUBAL LIGATION  1999  . TUBAL LIGATION    . TUBOPLASTY / [place]   Four times between 1980-1990    Social History   Socioeconomic History  . Marital status: Married    Spouse name: Not on file  . Number of children: Not on file  . Years of education: Not on file  . Highest education level: Not on file  Occupational History  . Not on file  Tobacco Use  . Smoking status: Former Smoker     Packs/day: 0.50    Years: 10.00    Pack years: 5.00    Types: Cigarettes    Quit date: 11/08/2009    Years since quitting: 10.8  . Smokeless tobacco: Never Used  Vaping Use  . Vaping Use: Never used  Substance and Sexual Activity  . Alcohol use: No  . Drug use: No  . Sexual activity: Yes    Partners: Male    Birth control/protection: Other-see comments    Comment: Tubal Ligation  Other Topics Concern  . Not on file  Social History Narrative  . Not on file   Social Determinants of Health   Financial Resource Strain:   . Difficulty of Paying Living Expenses: Not on file  Food Insecurity:   . Worried About Charity fundraiser in the Last Year: Not on file  . Ran Out of Food in the Last Year: Not on file  Transportation Needs:   . Lack of Transportation (Medical): Not on file  . Lack of Transportation (Non-Medical): Not on file  Physical Activity:   . Days of Exercise per Week: Not on file  . Minutes of Exercise per Session: Not on file  Stress:   . Feeling of Stress : Not on file  Social Connections:   . Frequency of Communication with Friends and Family: Not on file  . Frequency of Social Gatherings with Friends and Family: Not on file  . Attends Religious Services: Not on file  . Active Member of Clubs or Organizations: Not on file  . Attends Archivist Meetings: Not on file  . Marital Status: Not on file    Family History  Problem Relation Age of Onset  . Hypertension Maternal Grandfather   . Diabetes Maternal Grandfather   . Stroke Maternal Grandfather   . Hypertension Maternal Grandmother   . Cancer Maternal Grandmother   . Hypertension Paternal Grandfather   . Diabetes Paternal Grandfather   . Hypertension Paternal Grandmother   . Hypertension Mother   . Depression Mother   . Diabetes type II Mother   . Diabetes Mother   . Heart attack Father   . Hypertension Father   . Diabetes type II Father   . Diabetes Father   . Hyperlipidemia Father    . Depression Sister   . Diabetes type II Sister   . Diabetes Sister   . Hyperlipidemia Brother   . Cancer Maternal Aunt   . Alcohol abuse Maternal Uncle   . Alcohol abuse Paternal Uncle     Health Maintenance  Topic Date Due  . Hepatitis C Screening  04/07/2021 (Originally 08/24/1976)  . HIV Screening  04/07/2021 (Originally 04/11/1990)  .  COVID-19 Vaccine (1) 05/16/2021 (Originally 04/12/1987)  . PAP SMEAR-Modifier  06/16/2022  . TETANUS/TDAP  12/13/2026  . INFLUENZA VACCINE  Completed     ----------------------------------------------------------------------------------------------------------------------------------------------------------------------------------------------------------------- Physical Exam BP 116/66 (BP Location: Right Arm, Patient Position: Sitting, Cuff Size: Large)   Pulse 72   Wt 194 lb (88 kg)   SpO2 100%   BMI 33.30 kg/m   Physical Exam Constitutional:      Appearance: Normal appearance.  Musculoskeletal:     Comments: Tightness of lumbar paraspinals with ttp.  L5 Neutral, Rotated Right, sidebent Left  Neurological:     Mental Status: She is alert.    OMT: Soft tissue mobilization and myofascial release performed to lumbar spine initially. Muscle energy techniques applied to lumbar spine isolating L5 level with improvement in tissue texture and overlying tenderness. She tolerated well.   ------------------------------------------------------------------------------------------------------------------------------------------------------------------------------------------------------------------- Assessment and Plan  Somatic dysfunction of spine, lumbar OMT treatment to lumbar spine completed today. We'll plan to see her again in 1 week for re-treatment.  I advised her to hydrate well and ice area tonight.  If she is not having any improvement at that point I don't think she will benefit from continued treatment.    No orders of the defined  types were placed in this encounter.   No follow-ups on file.    This visit occurred during the SARS-CoV-2 public health emergency.  Safety protocols were in place, including screening questions prior to the visit, additional usage of staff PPE, and extensive cleaning of exam room while observing appropriate contact time as indicated for disinfecting solutions.

## 2020-09-24 ENCOUNTER — Telehealth: Payer: Self-pay | Admitting: Sports Medicine

## 2020-09-24 NOTE — Telephone Encounter (Signed)
Meta Hatchet with [facility] called. Pt has Multiple fall risks and she is a high falls risk.  Thank you

## 2020-10-01 ENCOUNTER — Ambulatory Visit: Payer: Medicare Other | Admitting: Family Medicine

## 2020-10-01 ENCOUNTER — Ambulatory Visit: Payer: Medicare Other | Admitting: Sports Medicine

## 2020-10-04 ENCOUNTER — Other Ambulatory Visit: Payer: Self-pay | Admitting: Sports Medicine

## 2020-10-04 DIAGNOSIS — G43909 Migraine, unspecified, not intractable, without status migrainosus: Secondary | ICD-10-CM

## 2020-10-07 ENCOUNTER — Ambulatory Visit (INDEPENDENT_AMBULATORY_CARE_PROVIDER_SITE_OTHER): Payer: Medicare Other | Admitting: Neurology

## 2020-10-07 ENCOUNTER — Encounter: Payer: Self-pay | Admitting: Neurology

## 2020-10-07 VITALS — BP 124/74 | HR 60 | Ht 64.0 in | Wt 194.6 lb

## 2020-10-07 DIAGNOSIS — G629 Polyneuropathy, unspecified: Secondary | ICD-10-CM

## 2020-10-07 DIAGNOSIS — R202 Paresthesia of skin: Secondary | ICD-10-CM

## 2020-10-07 DIAGNOSIS — M79672 Pain in left foot: Secondary | ICD-10-CM

## 2020-10-07 DIAGNOSIS — M79671 Pain in right foot: Secondary | ICD-10-CM | POA: Diagnosis not present

## 2020-10-07 DIAGNOSIS — R200 Anesthesia of skin: Secondary | ICD-10-CM | POA: Diagnosis not present

## 2020-10-07 NOTE — Patient Instructions (Addendum)
Peripheral Neuropathy Peripheral neuropathy is a type of nerve damage. It affects nerves that carry signals between the spinal cord and the arms, legs, and the rest of the body (peripheral nerves). It does not affect nerves in the spinal cord or brain. In peripheral neuropathy, one nerve or a group of nerves may be damaged. Peripheral neuropathy is a broad category that includes many specific nerve disorders, like diabetic neuropathy, hereditary neuropathy, and carpal tunnel syndrome. What are the causes? This condition may be caused by:  Diabetes. This is the most common cause of peripheral neuropathy.  Nerve injury.  Pressure or stress on a nerve that lasts a long time.  Lack (deficiency) of B vitamins. This can result from alcoholism, poor diet, or a restricted diet.  Infections.  Autoimmune diseases, such as rheumatoid arthritis and systemic lupus erythematosus.  Nerve diseases that are passed from parent to child (inherited).  Some medicines, such as cancer medicines (chemotherapy).  Poisonous (toxic) substances, such as lead and mercury.  Too little blood flowing to the legs.  Kidney disease.  Thyroid disease. In some cases, the cause of this condition is not known. What are the signs or symptoms? Symptoms of this condition depend on which of your nerves is damaged. Common symptoms include:  Loss of feeling (numbness) in the feet, hands, or both.  Tingling in the feet, hands, or both.  Burning pain.  Very sensitive skin.  Weakness.  Not being able to move a part of the body (paralysis).  Muscle twitching.  Clumsiness or poor coordination.  Loss of balance.  Not being able to control your bladder.  Feeling dizzy.  Sexual problems. How is this diagnosed? Diagnosing and finding the cause of peripheral neuropathy can be difficult. Your health care provider will take your medical history and do a physical exam. A neurological exam will also be done. This  involves checking things that are affected by your brain, spinal cord, and nerves (nervous system). For example, your health care provider will check your reflexes, how you move, and what you can feel. You may have other tests, such as:  Blood tests.  Electromyogram (EMG) and nerve conduction tests. These tests check nerve function and how well the nerves are controlling the muscles.  Imaging tests, such as CT scans or MRI to rule out other causes of your symptoms.  Removing a small piece of nerve to be examined in a lab (nerve biopsy). This is rare.  Removing and examining a small amount of the fluid that surrounds the brain and spinal cord (lumbar puncture). This is rare. How is this treated? Treatment for this condition may involve:  Treating the underlying cause of the neuropathy, such as diabetes, kidney disease, or vitamin deficiencies.  Stopping medicines that can cause neuropathy, such as chemotherapy.  Medicine to relieve pain. Medicines may include: ? Prescription or over-the-counter pain medicine. ? Antiseizure medicine. ? Antidepressants. ? Pain-relieving patches that are applied to painful areas of skin.  Surgery to relieve pressure on a nerve or to destroy a nerve that is causing pain.  Physical therapy to help improve movement and balance.  Devices to help you move around (assistive devices). Follow these instructions at home: Medicines  Take over-the-counter and prescription medicines only as told by your health care provider. Do not take any other medicines without first asking your health care provider.  Do not drive or use heavy machinery while taking prescription pain medicine. Lifestyle   Do not use any products that  contain nicotine or tobacco, such as cigarettes and e-cigarettes. Smoking keeps blood from reaching damaged nerves. If you need help quitting, ask your health care provider.  Avoid or limit alcohol. Too much alcohol can cause a vitamin B  deficiency, and vitamin B is needed for healthy nerves.  Eat a healthy diet. This includes: ? Eating foods that are high in fiber, such as fresh fruits and vegetables, whole grains, and beans. ? Limiting foods that are high in fat and processed sugars, such as fried or sweet foods. General instructions   If you have diabetes, work closely with your health care provider to keep your blood sugar under control.  If you have numbness in your feet: ? Check every day for signs of injury or infection. Watch for redness, warmth, and swelling. ? Wear padded socks and comfortable shoes. These help protect your feet.  Develop a good support system. Living with peripheral neuropathy can be stressful. Consider talking with a mental health specialist or joining a support group.  Use assistive devices and attend physical therapy as told by your health care provider. This may include using a walker or a cane.  Keep all follow-up visits as told by your health care provider. This is important. Contact a health care provider if:  You have new signs or symptoms of peripheral neuropathy.  You are struggling emotionally from dealing with peripheral neuropathy.  Your pain is not well-controlled. Get help right away if:  You have an injury or infection that is not healing normally.  You develop new weakness in an arm or leg.  You fall frequently. Summary  Peripheral neuropathy is when the nerves in the arms, or legs are damaged, resulting in numbness, weakness, or pain.  There are many causes of peripheral neuropathy, including diabetes, pinched nerves, vitamin deficiencies, autoimmune disease, and hereditary conditions.  Diagnosing and finding the cause of peripheral neuropathy can be difficult. Your health care provider will take your medical history, do a physical exam, and do tests, including blood tests and nerve function tests.  Treatment involves treating the underlying cause of the  neuropathy and taking medicines to help control pain. Physical therapy and assistive devices may also help. This information is not intended to replace advice given to you by your health care provider. Make sure you discuss any questions you have with your health care provider. Document Revised: 09/14/2017 Document Reviewed: 12/11/2016 Elsevier Patient Education  [place] Syndrome  Carpal tunnel syndrome is a condition that causes pain in your hand and arm. The carpal tunnel is a narrow area that is on the palm side of your wrist. Repeated wrist motion or certain diseases may cause swelling in the tunnel. This swelling can pinch the main nerve in the wrist (median nerve). What are the causes? This condition may be caused by:  Repeated wrist motions.  Wrist injuries.  Arthritis.  A sac of fluid (cyst) or abnormal growth (tumor) in the carpal tunnel.  Fluid buildup during pregnancy. Sometimes the cause is not known. What increases the risk? The following factors may make you more likely to develop this condition:  Having a job in which you move your wrist in the same way many times. This includes jobs like being a Software engineer or a Scientist, water quality.  Being a woman.  Having other health conditions, such as: ? Diabetes. ? Obesity. ? A thyroid gland that is not active enough (hypothyroidism). ? Kidney failure. What are the signs or symptoms? Symptoms of  this condition include:  A tingling feeling in your fingers.  Tingling or a loss of feeling (numbness) in your hand.  Pain in your entire arm. This pain may get worse when you bend your wrist and elbow for a long time.  Pain in your wrist that goes up your arm to your shoulder.  Pain that goes down into your palm or fingers.  A weak feeling in your hands. You may find it hard to grab and hold items. You may feel worse at night. How is this diagnosed? This condition is diagnosed with a medical history and  physical exam. You may also have tests, such as:  Electromyogram (EMG). This test checks the signals that the nerves send to the muscles.  Nerve conduction study. This test checks how well signals pass through your nerves.  Imaging tests, such as X-rays, ultrasound, and MRI. These tests check for what might be the cause of your condition. How is this treated? This condition may be treated with:  Lifestyle changes. You will be asked to stop or change the activity that caused your problem.  Doing exercise and activities that make bones and muscles stronger (physical therapy).  Learning how to use your hand again (occupational therapy).  Medicines for pain and swelling (inflammation). You may have injections in your wrist.  A wrist splint.  Surgery. Follow these instructions at home: If you have a splint:  Wear the splint as told by your doctor. Remove it only as told by your doctor.  Loosen the splint if your fingers: ? Tingle. ? Lose feeling (become numb). ? Turn cold and blue.  Keep the splint clean.  If the splint is not waterproof: ? Do not let it get wet. ? Cover it with a watertight covering when you take a bath or a shower. Managing pain, stiffness, and swelling   If told, put ice on the painful area: ? If you have a removable splint, remove it as told by your doctor. ? Put ice in a plastic bag. ? Place a towel between your skin and the bag. ? Leave the ice on for 20 minutes, 2-3 times per day. General instructions  Take over-the-counter and prescription medicines only as told by your doctor.  Rest your wrist from any activity that may cause pain. If needed, talk with your boss at work about changes that can help your wrist heal.  Do any exercises as told by your doctor, physical therapist, or occupational therapist.  Keep all follow-up visits as told by your doctor. This is important. Contact a doctor if:  You have new symptoms.  Medicine does not help  your pain.  Your symptoms get worse. Get help right away if:  You have very bad numbness or tingling in your wrist or hand. Summary  Carpal tunnel syndrome is a condition that causes pain in your hand and arm.  It is often caused by repeated wrist motions.  Lifestyle changes and medicines are used to treat this problem. Surgery may help in very bad cases.  Follow your doctor's instructions about wearing a splint, resting your wrist, keeping follow-up visits, and calling for help. This information is not intended to replace advice given to you by your health care provider. Make sure you discuss any questions you have with your health care provider. Document Revised: 02/08/2018 Document Reviewed: 02/08/2018 Elsevier Patient Education  2020 [place] is a test to check how well your muscles and nerves are working. This  procedure includes the combined use of electromyogram (EMG) and nerve conduction study (NCS). EMG is used to look for muscular disorders. NCS, which is also called electroneurogram, measures how well your nerves are controlling your muscles. The procedures are usually done together to check if your muscles and nerves are healthy. If the results of the tests are abnormal, this may indicate disease or injury, such as a neuromuscular disease or peripheral nerve damage. Tell a health care provider about:  Any allergies you have.  All medicines you are taking, including vitamins, herbs, eye drops, creams, and over-the-counter medicines.  Any problems you or family members have had with anesthetic medicines.  Any blood disorders you have.  Any surgeries you have had.  Any medical conditions you have.  If you have a pacemaker.  Whether you are pregnant or may be pregnant. What are the risks? Generally, this is a safe procedure. However, problems may occur, including:  Infection where the electrodes were  inserted.  Bleeding. What happens before the procedure? Medicines Ask your health care provider about:  Changing or stopping your regular medicines. This is especially important if you are taking diabetes medicines or blood thinners.  Taking medicines such as aspirin and ibuprofen. These medicines can thin your blood. Do not take these medicines unless your health care provider tells you to take them.  Taking over-the-counter medicines, vitamins, herbs, and supplements. General instructions  Your health care provider may ask you to avoid: ? Beverages that have caffeine, such as coffee and tea. ? Any products that contain nicotine or tobacco. These products include cigarettes, e-cigarettes, and chewing tobacco. If you need help quitting, ask your health care provider.  Do not use lotions or creams on the same day that you will be having the procedure. What happens during the procedure? For EMG   Your health care provider will ask you to stay in a position so that he or she can access the muscle that will be studied. You may be standing, sitting, or lying down.  You may be given a medicine that numbs the area (local anesthetic).  A very thin needle that has an electrode will be inserted into your muscle.  Another small electrode will be placed on your skin near the muscle.  Your health care provider will ask you to continue to remain still.  The electrodes will send a signal that tells about the electrical activity of your muscles. You may see this on a monitor or hear it in the room.  After your muscles have been studied at rest, your health care provider will ask you to contract or flex your muscles. The electrodes will send a signal that tells about the electrical activity of your muscles.  Your health care provider will remove the electrodes and the electrode needles when the procedure is finished. The procedure may vary among health care providers and hospitals. For  NCS   An electrode that records your nerve activity (recording electrode) will be placed on your skin by the muscle that is being studied.  An electrode that is used as a reference (reference electrode) will be placed near the recording electrode.  A paste or gel will be applied to your skin between the recording electrode and the reference electrode.  Your nerve will be stimulated with a mild shock. Your health care provider will measure how much time it takes for your muscle to react.  Your health care provider will remove the electrodes and the gel when the procedure  is finished. The procedure may vary among health care providers and hospitals. What happens after the procedure?  It is up to you to get the results of your procedure. Ask your health care provider, or the department that is doing the procedure, when your results will be ready.  Your health care provider may: ? Give you medicines for any pain. ? Monitor the insertion sites to make sure that bleeding stops. Summary  Electromyoneurogram is a test to check how well your muscles and nerves are working.  If the results of the tests are abnormal, this may indicate disease or injury.  This is a safe procedure. However, problems may occur, such as bleeding and infection.  Your health care provider will do two tests to complete this procedure. One checks your muscles (EMG) and another checks your nerves (NCS).  It is up to you to get the results of your procedure. Ask your health care provider, or the department that is doing the procedure, when your results will be ready. This information is not intended to replace advice given to you by your health care provider. Make sure you discuss any questions you have with your health care provider. Document Revised: 06/18/2018 Document Reviewed: 05/31/2018 Elsevier Patient Education  [place].

## 2020-10-07 NOTE — Progress Notes (Signed)
[facility]    Provider:  Dr Jaynee Eagles Requesting Provider: Silverio Decamp,* Primary Care Provider:  Silverio Decamp, MD  CC:  Neuropathy in feet  HPI:  Evelyn Sanchez is a 44 y.o. female here as requested by Silverio Decamp,* for neuropathy in the feet. PMHx migraine, chronic pain, HTN, femoral neuropathy left, PTSD, chronic low back pain, fhx amyloidosis.  She is here with her husband. Started about a year ago noticing tingling in her feet more in the arch. She would feel it more in bed. Over time she feels like a hot needle poking her in the bottom of her feet and it is sharp and burning. She notices it more with walking. She also notices it in bed. She notices it in the heel on the left foot. Symmetrical. Worsening. She has a lot of back problems, she thinks it is associated with the feet. She used to be a toe walker as a child. She has had lots of back problems, had multiple procedures, ESI, she has pain in her back continuously and the more walking she does the pain in her back gets worse and the foot pain gets worse. No alcohol use. Father had neuropathy, he was diabetic, he had ALS and parkinson's disease. Her mother has parkinson's disease they are older and her sister is going through similar with her feet but not as severe.   Hand started a few years ago, possibly not related to the feet, it is the whole hand, the 5th digit may not be involved. Worse with use. She gets it a lot when sleeping. She used to do coloring or writing and it would worsen. No neck pain or radiculopathy.  Reviewed notes, labs and imaging from outside physicians, which showed:  Right leg weakness.  EXAM: MRI LUMBAR SPINE WITHOUT CONTRAST  TECHNIQUE: Multiplanar, multisequence MR imaging of the lumbar spine was performed. No intravenous contrast was administered.  COMPARISON:  06/22/2015  FINDINGS: Segmentation:  Standard.  Alignment:  Normal.  Vertebrae: No  fracture, suspicious osseous lesion, or significant marrow edema.  Conus medullaris and cauda equina: Conus extends to the upper L2 level. Conus and cauda equina appear normal.  Paraspinal and other soft tissues: Unremarkable.  Disc levels:  Mild disc desiccation throughout the lumbar and included lower thoracic spine.  T12-L1 through L3-4: Negative.  L4-5: Mild disc bulging and moderate right greater than left facet arthrosis result in borderline to mild bilateral neural foraminal stenosis without spinal stenosis. Facet arthrosis has mildly progressed. There is a trace left facet joint effusion.  L5-S1: Mild right and moderate left facet arthrosis, mildly progressed from prior. No disc herniation or stenosis.  IMPRESSION: 1. Mildly progressive facet arthrosis at L4-5 and L5-S1. 2. Unchanged mild disc bulging and borderline to mild bilateral neural foraminal stenosis at L4-5.  Review of Systems: Patient complains of symptoms per HPI as well as the following symptoms: low back pain, numbness and tingling. Pertinent negatives and positives per HPI. All others negative.   Social History   Socioeconomic History  . Marital status: Married    Spouse name: Not on file  . Number of children: Not on file  . Years of education: Not on file  . Highest education level: High school graduate  Occupational History  . Not on file  Tobacco Use  . Smoking status: Former Smoker    Packs/day: 0.50    Years: 10.00    Pack years: 5.00    Types: Cigarettes  Quit date: 11/08/2009    Years since quitting: 10.9  . Smokeless tobacco: Never Used  Vaping Use  . Vaping Use: Never used  Substance and Sexual Activity  . Alcohol use: No  . Drug use: No  . Sexual activity: Yes    Partners: Male    Birth control/protection: Other-see comments    Comment: Tubal Ligation  Other Topics Concern  . Not on file  Social History Narrative   Lives at home with husband   Right handed    Caffeine: none    Social Determinants of Health   Financial Resource Strain: Not on file  Food Insecurity: Not on file  Transportation Needs: Not on file  Physical Activity: Not on file  Stress: Not on file  Social Connections: Not on file  Intimate Partner Violence: Not on file    Family History  Problem Relation Age of Onset  . Hypertension Maternal Grandfather   . Diabetes Maternal Grandfather   . Stroke Maternal Grandfather   . Stomach cancer Maternal Grandfather   . Hypertension Maternal Grandmother   . Cancer Maternal Grandmother        cervical  . Hypertension Paternal Grandfather   . Diabetes Paternal Grandfather   . Hypertension Paternal Grandmother   . Aneurysm Paternal Grandmother   . Cerebral aneurysm Paternal Grandmother   . Hypertension Mother   . Depression Mother   . Diabetes type II Mother   . Diabetes Mother   . Parkinson's disease Mother   . Heart attack Father   . Hypertension Father   . Diabetes type II Father   . Diabetes Father   . Hyperlipidemia Father   . Prostate cancer Father   . ALS Father   . Heart disease Father   . Parkinson's disease Father   . Depression Sister   . Diabetes type II Sister   . Diabetes Sister   . High blood pressure Sister   . Hyperlipidemia Brother   . Cancer Maternal Aunt   . Alcohol abuse Maternal Uncle   . Cerebral aneurysm Maternal Uncle   . Alcohol abuse Paternal Uncle     Past Medical History:  Diagnosis Date  . Abnormal pap 1999   colpo  . Abnormal uterine bleeding 05/02/2012  . Anxiety   . Arthritis of both knees 2011  . Back pain   . Benign essential hypertension 10/22/2017  . Bilateral hand numbness 03/02/2016  . Bipolar I disorder, severe, current or most recent episode depressed, with rapid cycling ([place]) 06/11/2014   07/05/2018 PHQ 9 = 20, GAD7 = 15 08/02/2018 PHQ 9 = 14, GAD7 = 18 09/02/2018 PHQ 9 = 7, GAD7 = 7 09/30/2018 PHQ 9 = 8, GAD 7 = 8 06/10/2019 PHQ 9 = 8, GAD 7 = 16  . Chest pain, typical  09/30/2018  . Chlamydia   . Chronic pain   . Chronic pain syndrome 04/06/2016  . Depression   . Family history of ovarian cancer 05/02/2012  . Femoral neuropathy of left lower extremity 11/26/2014   NCV/EMG: Chronic neurogenic changes to left vastus lateralis, could be old L4 radiculopathy, femoral neuropathy, focal muscle damage.  No other evidence of radiculopathy.   . Fracture of coronoid process of ulna, left, closed 11/12/2014  . Ganglion cyst of flexor tendon sheath of finger, right 07/05/2018  . Ganglion cyst of wrist 01/01/2014  . GERD (gastroesophageal reflux disease)   . Hair loss 06/10/2019  . Hypertension   . Lateral epicondylitis of right elbow 11/15/2016  .  Lumbar facet arthropathy 03/08/2017  . Malignant melanoma ([place]) 11/19/2017  . Melanoma ([place])   . Migraine without status migrainosus, not intractable 03/19/2014   Overview:  Last Assessment & Plan:  Per patient request referral to neurology.  . Migraines   . Obesity 02/03/2014  . Ovarian cyst   . Periapical abscess 01/08/2019  . Pneumonia   . Post traumatic stress disorder 03/09/2012  . Premenstrual tension syndrome 02/10/2014   Overview:  Overview:  OCPs started 02/10/14  . Primary osteoarthritis of left knee 01/01/2014  . PTSD (post-traumatic stress disorder)   . Severe acute respiratory syndrome coronavirus 2 (SARS-CoV-2) detected 10/14/2018  . Shivering 03/05/2019  . Trichomonas   . Vaginal Pap smear, abnormal   . Weakness of both legs 12/17/2012    Patient Active Problem List   Diagnosis Date Noted  . Somatic dysfunction of spine, lumbar 08/25/2020  . Leg length discrepancy 08/25/2020  . Numbness and tingling of upper and lower extremities of both sides 07/23/2020  . Insomnia due to medical condition 04/07/2020  . Chronic pain of left ankle 03/12/2020  . AKI (acute kidney injury) ([place]) 01/09/2020  . Hypertriglyceridemia 09/24/2019  . Family history of amyloidosis 06/26/2019  . Hair loss 06/10/2019  . Periapical abscess  01/08/2019  . Ganglion cyst of flexor tendon sheath of finger, right 07/05/2018  . Malignant melanoma ([place]) 11/19/2017  . Annual physical exam 10/22/2017  . Benign essential hypertension 10/22/2017  . Lumbar facet arthropathy 03/08/2017  . Lateral epicondylitis of right elbow 11/15/2016  . Chronic pain syndrome 04/06/2016  . Bilateral hand numbness 03/02/2016  . Femoral neuropathy of left lower extremity 11/26/2014  . Fracture of coronoid process of ulna, left, closed 11/12/2014  . Bipolar I disorder, severe, current or most recent episode depressed, with rapid cycling ([place]) 06/11/2014  . Migraine without status migrainosus, not intractable 03/19/2014  . Premenstrual tension syndrome 02/10/2014  . Obesity 02/03/2014  . Left knee revision arthroplasty secondary to infection 01/01/2014  . Ganglion cyst of wrist 01/01/2014  . Abnormal uterine bleeding 05/02/2012  . Family history of ovarian cancer 05/02/2012  . Post traumatic stress disorder 03/09/2012    Past Surgical History:  Procedure Laterality Date  . ABLATION    . ADENOIDECTOMY    . APPENDECTOMY  1996  . DILATION AND CURETTAGE OF UTERUS  1998  . edometrial ablation    . KNEE ARTHROSCOPY  1991   Left Kneex2  . LYMPH NODE BIOPSY Left 12/11/2017   Procedure: SENTINEL LYMPH NODE MAPPING  BIOPSY;  Surgeon: Stark Klein, MD;  Location: [place];  Service: General;  Laterality: Left;  Marland Kitchen MELANOMA EXCISION Left 12/11/2017   Procedure: EXCISION OF PRIOR SCAR LEFT SHOULDER MELANOMA WITH CLOSURE;  Surgeon: Stark Klein, MD;  Location: [place] [facility] [place];  Service: General;  Laterality: Left;  . Tonsilectomy  1984  . TONSILLECTOMY    . TOTAL KNEE ARTHROPLASTY  12/18/2019  . TOTAL KNEE REVISION     x2 01/09/20 and 01/20/20  . TUBAL LIGATION  1999  . TUBAL LIGATION    . tubes in ears     4x  . TUBOPLASTY / TUBOTUBAL ANASTOMOSIS  1980   Four times between 1980-1990  . WISDOM TOOTH EXTRACTION      Current Outpatient Medications  Medication Sig  Dispense Refill  . acetaminophen (TYLENOL) 650 MG CR tablet Take 2 tablets (1,300 mg total) by mouth every 8 (eight) hours as needed for pain. 90 tablet 3  . albuterol (PROVENTIL) (2.5 MG/3ML) 0.083%  nebulizer solution TAKE 3 MLS (2.5 MG TOTAL) BY NEBULIZATION EVERY 4 (FOUR) HOURS AS NEEDED FOR WHEEZING OR SHORTNESS OF BREATH (PLEASE INCLUDE NEBULIZER MACHINE, HOSES, AND MASK IF NEEDED.). 75 mL 6  . albuterol (VENTOLIN HFA) 108 (90 Base) MCG/ACT inhaler TAKE 2 PUFFS BY MOUTH EVERY 6 HOURS AS NEEDED FOR WHEEZE 36 g 11  . amLODipine (NORVASC) 5 MG tablet TAKE 1 TABLET BY MOUTH EVERY DAY 90 tablet 1  . aspirin EC 81 MG tablet Take 1 tablet (81 mg total) by mouth daily. 90 tablet 3  . busPIRone (BUSPAR) 15 MG tablet TAKE 1 TABLET BY MOUTH 3 TIMES A DAY 270 tablet 2  . DULoxetine (CYMBALTA) 60 MG capsule TAKE 2 CAPSULES BY MOUTH EVERY DAY 180 capsule 2  . fenofibrate 160 MG tablet TAKE 1 TABLET BY MOUTH EVERY DAY 90 tablet 3  . gabapentin (NEURONTIN) 300 MG capsule One tab PO qHS for a week, then BID for a week, then TID. May double weekly to a max of 3,625m/day 90 capsule 3  . HYDROcodone-acetaminophen (NORCO) 10-325 MG tablet Take 1 tablet by mouth 4 (four) times daily as needed.    .Marland Kitchenibuprofen (ADVIL) 800 MG tablet Take 800 mg by mouth 3 (three) times daily.    .Marland Kitchenlisinopril-hydrochlorothiazide (ZESTORETIC) 20-25 MG tablet TAKE 1/2 TABLET BY MOUTH EVERY DAY 45 tablet 1  . Menthol, Topical Analgesic, (BENGAY EX) Apply 1 application topically 2 (two) times daily as needed (for pain).    . Multiple Vitamins-Minerals (MULTIVITAMIN PO) Take 1 tablet by mouth daily.    . nitroGLYCERIN (NITROSTAT) 0.4 MG SL tablet PLEASE SEE ATTACHED FOR DETAILED DIRECTIONS 25 tablet 0  . omega-3 acid ethyl esters (LOVAZA) 1 g capsule TAKE 2 CAPSULES (2 G TOTAL) BY MOUTH 2 (TWO) TIMES DAILY. 360 capsule 3  . Respiratory Therapy Supplies (NEBULIZER AIR TUBE/PLUGS) MISC Mask and tubing for use as needed 1 each 0  .  rizatriptan (MAXALT) 10 MG tablet TAKE 1 TABLET BY MOUTH AS NEEDED FOR MIGRAINE. MAY REPEAT IN 2 HOURS IF NEEDED 10 tablet 5  . senna-docusate (SENOKOT-S) 8.6-50 MG tablet TAKE 2 TABLETS BY MOUTH 2 TIMES DAILY. WHILE TAKING NARCOTIC PAIN MEDICATION FOR THE PREVENTION OF CONSTIPATION. HOLD FOR DIARRHEA.    .Marland KitchentiZANidine (ZANAFLEX) 4 MG tablet Take by mouth.    . topiramate (TOPAMAX) 50 MG tablet TAKE 1/2 TAB BY MOUTH NIGHTLY FOR 1 WEEK, THEN 1 TAB NIGHTLY FOR 1 WEEK,THEN 1 TAB TWICE DAILY 180 tablet 1  . valACYclovir (VALTREX) 1000 MG tablet Take 1 tablet (1,000 mg total) by mouth daily. Take for 5 days 5 tablet 2  . zaleplon (SONATA) 10 MG capsule Take 1-2 capsules (10-20 mg total) by mouth at bedtime as needed for sleep. 30 capsule 3   No current facility-administered medications for this visit.    Allergies as of 10/07/2020 - Review Complete 10/07/2020  Allergen Reaction Noted  . Nickel Rash 12/08/2019  . Ambien [zolpidem tartrate] Other (See Comments) 07/16/2017    Vitals: BP 124/74 (BP Location: Right Arm, Patient Position: Sitting)   Pulse 60   Ht 5' 4"  (1.626 m)   Wt 194 lb 9.6 oz (88.3 kg)   BMI 33.40 kg/m  Last Weight:  Wt Readings from Last 1 Encounters:  10/07/20 194 lb 9.6 oz (88.3 kg)   Last Height:   Ht Readings from Last 1 Encounters:  10/07/20 5' 4"  (1.626 m)     Physical exam: Exam: Gen: NAD, conversant, well nourised,  obese, well groomed                     CV: RRR, no MRG. No Carotid Bruits. No peripheral edema, warm, nontender Eyes: Conjunctivae clear without exudates or hemorrhage  Neuro: Detailed Neurologic Exam  Speech:    Speech is normal; fluent and spontaneous with normal comprehension.  Cognition:    The patient is oriented to person, place, and time;     recent and remote memory intact;     language fluent;     normal attention, concentration,     fund of knowledge Cranial Nerves:    The pupils are equal, round, and reactive to light.  Pupils too small to visualize fundi. Visual fields are full to finger confrontation. Extraocular movements are intact. Trigeminal sensation is intact and the muscles of mastication are normal. The face is symmetric. The palate elevates in the midline. Hearing intact. Voice is normal. Shoulder shrug is normal. The tongue has normal motion without fasciculations.   Coordination:    No dysmetria or ataxia  Gait:    Difficulty with heel walk (very tight achilles tendons)  Motor Observation:    No asymmetry, no atrophy, and no involuntary movements noted. Tone:    Normal muscle tone.    Posture:    Posture is normal. normal erect    Strength:    Strength is V/V in the upper and lower limbs.      Sensation: decreased pin prick to mid calf, intact vibration     Reflex Exam:  DTR's:    Deep tendon reflexes in the upper and lower extremities are brisk bilaterally.   Toes:    The toes are downgoing bilaterally.   Clonus:    Clonus is absent.    Assessment/Plan:  44 y.o. female here as requested by Silverio Decamp,* for neuropathy in the feet. PMHx migraine, chronic pain, HTN, femoral neuropathy left, PTSD, chronic low back pain, fhx amyloidosis.  Unusual for neuropathy to start in the arch. It is also in the heel on the left. Usually peripheral neuropathy starts closer to the toes. She was a toe walker,difficulty with heel walk due to very tight achilles, could this be mechanical? Plantar fasciitis or other structiural etiology? We will perform thorough evaluation. May consider skin biopsy including amyloid due to family history.  EMG/NCS: Both feet are symmetric but the left hand it worse so do the left hanf and left leg. And if the left hand is affected with CTS we will have to check the right hand as well.   Orders Placed This Encounter  Procedures  . ANA, IFA (with reflex)  . Sjogren's syndrome antibods(ssa + ssb)  . Rheumatoid factor  . Sedimentation rate  . Vitamin B1   . Vitamin B6  . NCV with EMG(electromyography)   No orders of the defined types were placed in this encounter.   Cc: Jonette Mate, MD  Sarina Ill, MD  [facility] [address] [place], [place] [address]  Phone [phone] Fax [phone]

## 2020-10-10 ENCOUNTER — Encounter: Payer: Self-pay | Admitting: Neurology

## 2020-10-20 LAB — ANTINUCLEAR ANTIBODIES, IFA: ANA Titer 1: NEGATIVE

## 2020-10-20 LAB — RHEUMATOID FACTOR: Rheumatoid fact SerPl-aCnc: 18.5 IU/mL — ABNORMAL HIGH (ref <14.0)

## 2020-10-20 LAB — VITAMIN B6: Vitamin B6: 5.7 ug/L (ref 2.0–32.8)

## 2020-10-20 LAB — SEDIMENTATION RATE: Sed Rate: 2 mm/hr (ref 0–32)

## 2020-10-20 LAB — VITAMIN B1: Thiamine: 174.1 nmol/L (ref 66.5–200.0)

## 2020-10-20 LAB — SJOGREN'S SYNDROME ANTIBODS(SSA + SSB)
ENA SSA (RO) Ab: <0.2 AI (ref 0.0–0.9)
ENA SSB (LA) Ab: <0.2 AI (ref 0.0–0.9)

## 2020-10-27 NOTE — Progress Notes (Unsigned)
Full Name: Annelie Boak Gender: Female MRN #: [id number] Date of Birth: [birth year]    Visit Date: 10/28/2020 08:50 Age: 45 Years Examining Physician: Sarina Ill, MD  Referring Physician: Silverio Decamp, MD    History: 45 y.o. female here as requested by Silverio Decamp,* for neuropathy in the feet and left hand. PMHx migraine, chronic pain, HTN, femoral neuropathy left, PTSD, chronic low back pain, fhx amyloidosis. Pending genetic testing and then may consider skin biopsy including amyloid due to family history.   Summary: EMG/NCS was performed on the left upper and left lower extremities. All nerves and muscles (as indicated in the following tables) were within normal limits.    Conclusion: This is a normal study.   Sarina Ill, M.D.  [facility] [place], [place] [address] Tel: [phone] Fax: [phone]  Verbal informed consent was obtained from the patient, patient was informed of potential risk of procedure, including bruising, bleeding, hematoma formation, infection, muscle weakness, muscle pain, numbness, among others.         [place]    Nerve / Sites Muscle Latency Ref. Amplitude Ref. Rel Amp Segments Distance Velocity Ref. Area    ms ms mV mV %  cm m/s m/s mVms  L Median - APB     Wrist APB 2.9 ?4.4 7.5 ?4.0 100 Wrist - APB 7   24.9     Upper arm APB 6.6  6.8  91.2 Upper arm - Wrist 20 55 ?49 24.0  L Ulnar - ADM     Wrist ADM 2.4 ?3.3 9.6 ?6.0 100 Wrist - ADM 7   27.6     B.Elbow ADM 5.6  9.4  97.3 B.Elbow - Wrist 19 59 ?49 28.0     A.Elbow ADM 7.4  9.0  96 A.Elbow - B.Elbow 10 54 ?49 27.3         A.Elbow - Wrist      L Peroneal - EDB     Ankle EDB 4.6 ?6.5 4.7 ?2.0 100 Ankle - EDB 9   19.8     Fib head EDB 10.5  4.3  90.4 Fib head - Ankle 27 46 ?44 17.4     Pop fossa EDB 12.6  4.3  102 Pop fossa - Fib head 10 46 ?44 18.7         Pop fossa - Ankle      L Tibial - AH     Ankle AH 3.4 ?5.8 10.8 ?4.0 100 Ankle -  AH 9   21.2     Pop fossa AH 11.2  9.1  83.6 Pop fossa - Ankle 33 43 ?41 22.6             SSR    Nerve / Sites Latency   s  L Sympathetic - Foot     Foot 1.96       SNC    Nerve / Sites Rec. Site Peak Lat Ref.  Amp Ref. Segments Distance Peak Diff Ref.    ms ms V V  cm ms ms  L Sural - Ankle (Calf)     Calf Ankle 3.8 ?4.4 8 ?6 Calf - Ankle 14    L Superficial peroneal - Ankle     Lat leg Ankle 4.0 ?4.4 6 ?6 Lat leg - Ankle 14    L Median, Ulnar - Transcarpal comparison     Median Palm Wrist 2.0 ?2.2 107 ?35 Median Palm - Wrist 8  Ulnar Palm Wrist 1.8 ?2.2 29 ?12 Ulnar Palm - Wrist 8          Median Palm - Ulnar Palm  0.2 ?0.4  L Median - Orthodromic (Dig II, Mid palm)     Dig II Wrist 2.7 ?3.4 21 ?10 Dig II - Wrist 13    L Ulnar - Orthodromic, (Dig V, Mid palm)     Dig V Wrist 2.2 ?3.1 12 ?5 Dig V - Wrist 50                 F  Wave    Nerve F Lat Ref.   ms ms  L Tibial - AH 47.5 ?56.0  L Ulnar - ADM 26.1 ?32.0         EMG Summary Table    Spontaneous MUAP Recruitment  Muscle IA Fib PSW Fasc Other Amp Dur. Poly Pattern  L. Biceps femoris (long head) Normal None None None _______ Normal Normal Normal Normal  L. Deltoid Normal None None None _______ Normal Normal Normal Normal  L. Triceps brachii Normal None None None _______ Normal Normal Normal Normal  L. Opponens pollicis Normal None None None _______ Normal Normal Normal Normal  L. Pronator teres Normal None None None _______ Normal Normal Normal Normal  L. First dorsal interosseous Normal None None None _______ Normal Normal Normal Normal  L. Vastus medialis Normal None None None _______ Normal Normal Normal Normal  L. Tibialis anterior Normal None None None _______ Normal Normal Normal Normal  L. Abductor hallucis Normal None None None _______ Normal Normal Normal Normal  L. Gastrocnemius (Medial head) Normal None None None _______ Normal Normal Normal Normal  L. Gluteus maximus Normal None None None _______  Normal Normal Normal Normal  L. Gluteus medius Normal None None None _______ Normal Normal Normal Normal  L. Cervical paraspinals (low) Normal None None None _______ Normal Normal Normal Normal  L. Lumbar paraspinals (low) Normal None None None _______ Normal Normal Normal Normal

## 2020-10-28 ENCOUNTER — Other Ambulatory Visit: Payer: Self-pay

## 2020-10-28 ENCOUNTER — Telehealth: Payer: Self-pay | Admitting: *Deleted

## 2020-10-28 ENCOUNTER — Ambulatory Visit (INDEPENDENT_AMBULATORY_CARE_PROVIDER_SITE_OTHER): Payer: Medicare Other | Admitting: Neurology

## 2020-10-28 DIAGNOSIS — R200 Anesthesia of skin: Secondary | ICD-10-CM

## 2020-10-28 DIAGNOSIS — R202 Paresthesia of skin: Secondary | ICD-10-CM

## 2020-10-28 DIAGNOSIS — Z0289 Encounter for other administrative examinations: Secondary | ICD-10-CM

## 2020-10-28 DIAGNOSIS — M79671 Pain in right foot: Secondary | ICD-10-CM

## 2020-10-28 DIAGNOSIS — M79672 Pain in left foot: Secondary | ICD-10-CM

## 2020-10-28 DIAGNOSIS — G629 Polyneuropathy, unspecified: Secondary | ICD-10-CM

## 2020-10-28 NOTE — Procedures (Signed)
Full Name: Evelyn Sanchez Gender: Female MRN #: [id number] Date of Birth: January 03, 1976    Visit Date: 10/28/2020 08:50 Age: 45 Years Examining Physician: Sarina Ill, MD  Referring Physician: Silverio Decamp, MD    History: 45 y.o. female here as requested by Silverio Decamp,* for neuropathy in the feet and left hand. PMHx migraine, chronic pain, HTN, femoral neuropathy left, PTSD, chronic low back pain, fhx amyloidosis. Pending genetic testing and then may consider skin biopsy including amyloid due to family history.   Summary: EMG/NCS was performed on the left upper and left lower extremities. All nerves and muscles (as indicated in the following tables) were within normal limits.    Conclusion: This is a normal study.   Sarina Ill, M.D.  [facility] [place], [place] [address] Tel: [phone] Fax: [phone]  Verbal informed consent was obtained from the patient, patient was informed of potential risk of procedure, including bruising, bleeding, hematoma formation, infection, muscle weakness, muscle pain, numbness, among others.         [place]    Nerve / Sites Muscle Latency Ref. Amplitude Ref. Rel Amp Segments Distance Velocity Ref. Area    ms ms mV mV %  cm m/s m/s mVms  L Median - APB     Wrist APB 2.9 ?4.4 7.5 ?4.0 100 Wrist - APB 7   24.9     Upper arm APB 6.6  6.8  91.2 Upper arm - Wrist 20 55 ?49 24.0  L Ulnar - ADM     Wrist ADM 2.4 ?3.3 9.6 ?6.0 100 Wrist - ADM 7   27.6     B.Elbow ADM 5.6  9.4  97.3 B.Elbow - Wrist 19 59 ?49 28.0     A.Elbow ADM 7.4  9.0  96 A.Elbow - B.Elbow 10 54 ?49 27.3         A.Elbow - Wrist      L Peroneal - EDB     Ankle EDB 4.6 ?6.5 4.7 ?2.0 100 Ankle - EDB 9   19.8     Fib head EDB 10.5  4.3  90.4 Fib head - Ankle 27 46 ?44 17.4     Pop fossa EDB 12.6  4.3  102 Pop fossa - Fib head 10 46 ?44 18.7         Pop fossa - Ankle      L Tibial - AH     Ankle AH 3.4 ?5.8 10.8 ?4.0 100 Ankle -  AH 9   21.2     Pop fossa AH 11.2  9.1  83.6 Pop fossa - Ankle 33 43 ?41 22.6             SSR    Nerve / Sites Latency   s  L Sympathetic - Foot     Foot 1.96       SNC    Nerve / Sites Rec. Site Peak Lat Ref.  Amp Ref. Segments Distance Peak Diff Ref.    ms ms V V  cm ms ms  L Sural - Ankle (Calf)     Calf Ankle 3.8 ?4.4 8 ?6 Calf - Ankle 14    L Superficial peroneal - Ankle     Lat leg Ankle 4.0 ?4.4 6 ?6 Lat leg - Ankle 14    L Median, Ulnar - Transcarpal comparison     Median Palm Wrist 2.0 ?2.2 107 ?35 Median Palm - Wrist 8  Ulnar Palm Wrist 1.8 ?2.2 29 ?12 Ulnar Palm - Wrist 8          Median Palm - Ulnar Palm  0.2 ?0.4  L Median - Orthodromic (Dig II, Mid palm)     Dig II Wrist 2.7 ?3.4 21 ?10 Dig II - Wrist 13    L Ulnar - Orthodromic, (Dig V, Mid palm)     Dig V Wrist 2.2 ?3.1 12 ?5 Dig V - Wrist 45                 F  Wave    Nerve F Lat Ref.   ms ms  L Tibial - AH 47.5 ?56.0  L Ulnar - ADM 26.1 ?32.0         EMG Summary Table    Spontaneous MUAP Recruitment  Muscle IA Fib PSW Fasc Other Amp Dur. Poly Pattern  L. Biceps femoris (long head) Normal None None None _______ Normal Normal Normal Normal  L. Deltoid Normal None None None _______ Normal Normal Normal Normal  L. Triceps brachii Normal None None None _______ Normal Normal Normal Normal  L. Opponens pollicis Normal None None None _______ Normal Normal Normal Normal  L. Pronator teres Normal None None None _______ Normal Normal Normal Normal  L. First dorsal interosseous Normal None None None _______ Normal Normal Normal Normal  L. Vastus medialis Normal None None None _______ Normal Normal Normal Normal  L. Tibialis anterior Normal None None None _______ Normal Normal Normal Normal  L. Abductor hallucis Normal None None None _______ Normal Normal Normal Normal  L. Gastrocnemius (Medial head) Normal None None None _______ Normal Normal Normal Normal  L. Gluteus maximus Normal None None None _______  Normal Normal Normal Normal  L. Gluteus medius Normal None None None _______ Normal Normal Normal Normal  L. Cervical paraspinals (low) Normal None None None _______ Normal Normal Normal Normal  L. Lumbar paraspinals (low) Normal None None None _______ Normal Normal Normal Normal

## 2020-10-28 NOTE — Progress Notes (Signed)
See procedure note.

## 2020-10-28 NOTE — Telephone Encounter (Signed)
Completed the [facility] genetic testing referral form for neuropathy (selected comprehensive panel option + TTR per Dr Jaynee Eagles). Order form signed by Dr Jaynee Eagles ad faxed to [facility]. Received a receipt of confirmation.

## 2020-11-15 ENCOUNTER — Other Ambulatory Visit: Payer: Self-pay | Admitting: Sports Medicine

## 2020-11-15 DIAGNOSIS — G4701 Insomnia due to medical condition: Secondary | ICD-10-CM

## 2020-11-25 DIAGNOSIS — G4700 Insomnia, unspecified: Secondary | ICD-10-CM | POA: Diagnosis not present

## 2020-11-25 DIAGNOSIS — R0683 Snoring: Secondary | ICD-10-CM | POA: Diagnosis not present

## 2020-11-25 DIAGNOSIS — R5382 Chronic fatigue, unspecified: Secondary | ICD-10-CM | POA: Diagnosis not present

## 2020-11-25 DIAGNOSIS — G478 Other sleep disorders: Secondary | ICD-10-CM | POA: Diagnosis not present

## 2020-11-25 DIAGNOSIS — G894 Chronic pain syndrome: Secondary | ICD-10-CM | POA: Diagnosis not present

## 2020-11-25 DIAGNOSIS — I10 Essential (primary) hypertension: Secondary | ICD-10-CM | POA: Diagnosis not present

## 2020-11-30 DIAGNOSIS — R0683 Snoring: Secondary | ICD-10-CM | POA: Diagnosis not present

## 2020-12-04 ENCOUNTER — Other Ambulatory Visit: Payer: Self-pay | Admitting: Sports Medicine

## 2020-12-04 DIAGNOSIS — F331 Major depressive disorder, recurrent, moderate: Secondary | ICD-10-CM

## 2020-12-04 DIAGNOSIS — I10 Essential (primary) hypertension: Secondary | ICD-10-CM

## 2020-12-20 DIAGNOSIS — D225 Melanocytic nevi of trunk: Secondary | ICD-10-CM | POA: Diagnosis not present

## 2020-12-20 DIAGNOSIS — D1801 Hemangioma of skin and subcutaneous tissue: Secondary | ICD-10-CM | POA: Diagnosis not present

## 2020-12-20 DIAGNOSIS — D2262 Melanocytic nevi of left upper limb, including shoulder: Secondary | ICD-10-CM | POA: Diagnosis not present

## 2020-12-20 DIAGNOSIS — Z85820 Personal history of malignant melanoma of skin: Secondary | ICD-10-CM | POA: Diagnosis not present

## 2020-12-20 DIAGNOSIS — D2261 Melanocytic nevi of right upper limb, including shoulder: Secondary | ICD-10-CM | POA: Diagnosis not present

## 2020-12-20 DIAGNOSIS — D485 Neoplasm of uncertain behavior of skin: Secondary | ICD-10-CM | POA: Diagnosis not present

## 2020-12-29 ENCOUNTER — Telehealth: Payer: Self-pay | Admitting: Neurology

## 2020-12-29 NOTE — Telephone Encounter (Signed)
[facility] test results mailed to pt per her request.

## 2020-12-29 NOTE — Telephone Encounter (Signed)
Copy of results sent to medical records for scanning.

## 2020-12-29 NOTE — Telephone Encounter (Signed)
[facility] genetic studies resulted with variants of uncertain significance in 2 genes that can be associated with neuropathy.  As we discussed in the office, genetic testing is not always definitive and sometimes we find abnormalities in genes that we do not know are causing pathology however both of these genes are involved in Charcot-Marie-Tooth which is a neuropathic disease and can cause numbness and tingling in the feet.  The next thing to do would be to call the [facility] genetic counselors and discuss with them, we will send you a copy.  It was negative for transthyretin mediated amyloidosis test.

## 2020-12-30 NOTE — Telephone Encounter (Signed)
Have her come back if symptoms significantly worsen.

## 2021-01-09 DIAGNOSIS — R202 Paresthesia of skin: Secondary | ICD-10-CM

## 2021-01-09 DIAGNOSIS — R200 Anesthesia of skin: Secondary | ICD-10-CM

## 2021-01-10 DIAGNOSIS — Z96653 Presence of artificial knee joint, bilateral: Secondary | ICD-10-CM | POA: Diagnosis not present

## 2021-01-10 DIAGNOSIS — Z8614 Personal history of Methicillin resistant Staphylococcus aureus infection: Secondary | ICD-10-CM | POA: Diagnosis not present

## 2021-01-10 DIAGNOSIS — T8454XD Infection and inflammatory reaction due to internal left knee prosthesis, subsequent encounter: Secondary | ICD-10-CM | POA: Diagnosis not present

## 2021-01-10 DIAGNOSIS — M25562 Pain in left knee: Secondary | ICD-10-CM | POA: Diagnosis not present

## 2021-01-10 DIAGNOSIS — T8454XS Infection and inflammatory reaction due to internal left knee prosthesis, sequela: Secondary | ICD-10-CM | POA: Diagnosis not present

## 2021-01-10 DIAGNOSIS — Z96652 Presence of left artificial knee joint: Secondary | ICD-10-CM | POA: Diagnosis not present

## 2021-01-10 DIAGNOSIS — T8450XD Infection and inflammatory reaction due to unspecified internal joint prosthesis, subsequent encounter: Secondary | ICD-10-CM | POA: Diagnosis not present

## 2021-01-10 DIAGNOSIS — Z471 Aftercare following joint replacement surgery: Secondary | ICD-10-CM | POA: Diagnosis not present

## 2021-01-10 DIAGNOSIS — Z9049 Acquired absence of other specified parts of digestive tract: Secondary | ICD-10-CM | POA: Diagnosis not present

## 2021-01-10 MED ORDER — GABAPENTIN 300 MG PO CAPS
ORAL_CAPSULE | ORAL | 3 refills | Status: DC
Start: 2021-01-10 — End: 2022-04-12

## 2021-01-13 DIAGNOSIS — M5136 Other intervertebral disc degeneration, lumbar region: Secondary | ICD-10-CM | POA: Diagnosis not present

## 2021-01-13 DIAGNOSIS — G894 Chronic pain syndrome: Secondary | ICD-10-CM | POA: Diagnosis not present

## 2021-01-13 DIAGNOSIS — M170 Bilateral primary osteoarthritis of knee: Secondary | ICD-10-CM | POA: Diagnosis not present

## 2021-02-01 ENCOUNTER — Other Ambulatory Visit: Payer: Self-pay | Admitting: Sports Medicine

## 2021-02-01 DIAGNOSIS — I10 Essential (primary) hypertension: Secondary | ICD-10-CM

## 2021-02-23 DIAGNOSIS — Z888 Allergy status to other drugs, medicaments and biological substances status: Secondary | ICD-10-CM | POA: Diagnosis not present

## 2021-02-23 DIAGNOSIS — M25562 Pain in left knee: Secondary | ICD-10-CM | POA: Diagnosis not present

## 2021-02-23 DIAGNOSIS — Z96652 Presence of left artificial knee joint: Secondary | ICD-10-CM | POA: Diagnosis not present

## 2021-02-23 DIAGNOSIS — Z471 Aftercare following joint replacement surgery: Secondary | ICD-10-CM | POA: Diagnosis not present

## 2021-04-08 ENCOUNTER — Other Ambulatory Visit: Payer: Self-pay | Admitting: Sports Medicine

## 2021-04-08 DIAGNOSIS — G43909 Migraine, unspecified, not intractable, without status migrainosus: Secondary | ICD-10-CM

## 2021-04-11 ENCOUNTER — Other Ambulatory Visit: Payer: Self-pay

## 2021-04-11 ENCOUNTER — Ambulatory Visit (INDEPENDENT_AMBULATORY_CARE_PROVIDER_SITE_OTHER): Payer: Medicare Other

## 2021-04-11 ENCOUNTER — Ambulatory Visit (INDEPENDENT_AMBULATORY_CARE_PROVIDER_SITE_OTHER): Payer: Medicare Other | Admitting: Sports Medicine

## 2021-04-11 VITALS — BP 114/73 | HR 72 | Ht 64.0 in | Wt 193.0 lb

## 2021-04-11 DIAGNOSIS — M5126 Other intervertebral disc displacement, lumbar region: Secondary | ICD-10-CM | POA: Diagnosis not present

## 2021-04-11 DIAGNOSIS — Z1211 Encounter for screening for malignant neoplasm of colon: Secondary | ICD-10-CM

## 2021-04-11 DIAGNOSIS — Z0000 Encounter for general adult medical examination without abnormal findings: Secondary | ICD-10-CM | POA: Diagnosis not present

## 2021-04-11 DIAGNOSIS — R32 Unspecified urinary incontinence: Secondary | ICD-10-CM

## 2021-04-11 DIAGNOSIS — J90 Pleural effusion, not elsewhere classified: Secondary | ICD-10-CM | POA: Diagnosis not present

## 2021-04-11 DIAGNOSIS — I10 Essential (primary) hypertension: Secondary | ICD-10-CM | POA: Diagnosis not present

## 2021-04-11 DIAGNOSIS — M47817 Spondylosis without myelopathy or radiculopathy, lumbosacral region: Secondary | ICD-10-CM | POA: Diagnosis not present

## 2021-04-11 NOTE — Progress Notes (Signed)
Subjective:    CC: Annual Physical Exam  HPI:  This patient is here for their annual physical  I reviewed the past medical history, family history, social history, surgical history, and allergies today and no changes were needed.  Please see the problem list section below in epic for further details.  Past Medical History: Past Medical History:  Diagnosis Date   Abnormal pap 1999   colpo   Abnormal uterine bleeding 05/02/2012   Anxiety    Arthritis of both knees 2011   Back pain    Benign essential hypertension 10/22/2017   Bilateral hand numbness 03/02/2016   Bipolar I disorder, severe, current or most recent episode depressed, with rapid cycling ([place]) 06/11/2014   07/05/2018 PHQ 9 = 20, GAD7 = 15 08/02/2018 PHQ 9 = 14, GAD7 = 18 09/02/2018 PHQ 9 = 7, GAD7 = 7 09/30/2018 PHQ 9 = 8, GAD 7 = 8 06/10/2019 PHQ 9 = 8, GAD 7 = 16   Chest pain, typical 09/30/2018   Chlamydia    Chronic pain    Chronic pain syndrome 04/06/2016   Depression    Family history of ovarian cancer 05/02/2012   Femoral neuropathy of left lower extremity 11/26/2014   NCV/EMG: Chronic neurogenic changes to left vastus lateralis, could be old L4 radiculopathy, femoral neuropathy, focal muscle damage.  No other evidence of radiculopathy.    Fracture of coronoid process of ulna, left, closed 11/12/2014   Ganglion cyst of flexor tendon sheath of finger, right 07/05/2018   Ganglion cyst of wrist 01/01/2014   GERD (gastroesophageal reflux disease)    Hair loss 06/10/2019   Hypertension    Lateral epicondylitis of right elbow 11/15/2016   Lumbar facet arthropathy 03/08/2017   Malignant melanoma ([place]) 11/19/2017   Melanoma ([place])    Migraine without status migrainosus, not intractable 03/19/2014   Overview:  Last Assessment & Plan:  Per patient request referral to neurology.   Migraines    Obesity 02/03/2014   Ovarian cyst    Periapical abscess 01/08/2019   Pneumonia    Post traumatic stress disorder 03/09/2012   Premenstrual  tension syndrome 02/10/2014   Overview:  Overview:  OCPs started 02/10/14   Primary osteoarthritis of left knee 01/01/2014   PTSD (post-traumatic stress disorder)    Severe acute respiratory syndrome coronavirus 2 (SARS-CoV-2) detected 10/14/2018   Shivering 03/05/2019   Trichomonas    Vaginal Pap smear, abnormal    Weakness of both legs 12/17/2012   Past Surgical History: Past Surgical History:  Procedure Laterality Date   ABLATION     ADENOIDECTOMY     APPENDECTOMY  1996   DILATION AND CURETTAGE OF UTERUS  1998   edometrial ablation     KNEE ARTHROSCOPY  1991   Left Kneex2   LYMPH NODE BIOPSY Left 12/11/2017   Procedure: SENTINEL LYMPH NODE MAPPING  BIOPSY;  Surgeon: Stark Klein, MD;  Location: [place];  Service: General;  Laterality: Left;   MELANOMA EXCISION Left 12/11/2017   Procedure: EXCISION OF PRIOR SCAR LEFT SHOULDER MELANOMA WITH CLOSURE;  Surgeon: Stark Klein, MD;  Location: [place];  Service: General;  Laterality: Left;   Tonsilectomy  1984   TONSILLECTOMY     TOTAL KNEE ARTHROPLASTY  12/18/2019   TOTAL KNEE REVISION     x2 01/09/20 and 01/20/20   TUBAL LIGATION  1999   TUBAL LIGATION     tubes in ears     4x   TUBOPLASTY / [place]  Four times between 1980-1990   WISDOM TOOTH EXTRACTION     Social History: Social History   Socioeconomic History   Marital status: Married    Spouse name: Not on file   Number of children: Not on file   Years of education: Not on file   Highest education level: High school graduate  Occupational History   Not on file  Tobacco Use   Smoking status: Former    Packs/day: 0.50    Years: 10.00    Pack years: 5.00    Types: Cigarettes    Quit date: 11/08/2009    Years since quitting: 11.4   Smokeless tobacco: Never  Vaping Use   Vaping Use: Never used  Substance and Sexual Activity   Alcohol use: No   Drug use: No   Sexual activity: Yes    Partners: Male    Birth control/protection: Other-see comments     Comment: Tubal Ligation  Other Topics Concern   Not on file  Social History Narrative   Lives at home with husband   Right handed   Caffeine: none    Social Determinants of Health   Financial Resource Strain: Not on file  Food Insecurity: Not on file  Transportation Needs: Not on file  Physical Activity: Not on file  Stress: Not on file  Social Connections: Not on file   Family History: Family History  Problem Relation Age of Onset   Hypertension Maternal Grandfather    Diabetes Maternal Grandfather    Stroke Maternal Grandfather    Stomach cancer Maternal Grandfather    Hypertension Maternal Grandmother    Cancer Maternal Grandmother        cervical   Hypertension Paternal Grandfather    Diabetes Paternal Grandfather    Hypertension Paternal Grandmother    Aneurysm Paternal Grandmother    Cerebral aneurysm Paternal Grandmother    Hypertension Mother    Depression Mother    Diabetes type II Mother    Diabetes Mother    Parkinson's disease Mother    Heart attack Father    Hypertension Father    Diabetes type II Father    Diabetes Father    Hyperlipidemia Father    Prostate cancer Father    ALS Father    Heart disease Father    Parkinson's disease Father    Depression Sister    Diabetes type II Sister    Diabetes Sister    High blood pressure Sister    Hyperlipidemia Brother    Cancer Maternal Aunt    Alcohol abuse Maternal Uncle    Cerebral aneurysm Maternal Uncle    Alcohol abuse Paternal Uncle    Allergies: Allergies  Allergen Reactions   Nickel Rash    Earrings cause redness and rash Earrings cause redness and rash    Ambien [Zolpidem Tartrate] Other (See Comments)    hallucinations   Medications: See med rec.  Review of Systems: No headache, visual changes, nausea, vomiting, diarrhea, constipation, dizziness, abdominal pain, skin rash, fevers, chills, night sweats, swollen lymph nodes, weight loss, chest pain, body aches, joint swelling, muscle  aches, shortness of breath, mood changes, visual or auditory hallucinations.  Objective:    General: Well Developed, well nourished, and in no acute distress.  Neuro: Alert and oriented x3, extra-ocular muscles intact, sensation grossly intact. Cranial nerves II through XII are intact, motor, sensory, and coordinative functions are all intact. HEENT: Normocephalic, atraumatic, pupils equal round reactive to light, neck supple, no masses, no lymphadenopathy, thyroid  nonpalpable. Oropharynx, nasopharynx, external ear canals are unremarkable. Skin: Warm and dry, no rashes noted.  Cardiac: Regular rate and rhythm, no murmurs rubs or gallops.  Respiratory: Clear to auscultation bilaterally. Not using accessory muscles, speaking in full sentences.  Abdominal: Soft, nontender, nondistended, positive bowel sounds, no masses, no organomegaly.  Musculoskeletal: Shoulder, elbow, wrist, hip, knee, ankle stable, and with full range of motion.  Impression and Recommendations:    The patient was counselled, risk factors were discussed, anticipatory guidance given.  Annual physical exam Annual physical exam, up-to-date on screening measures with the exception of colon cancer screening, adding Cologuard testing.   Back pain and urinary incontinence History of stress incontinence, now complaining of complete incontinence and back pain, she did have a lumbar spine MRI in 2019 that was for the most part unremarkable, considering her current symptomatology we will going get another lumbar spine MRI today, adding x-rays/MRI. Certainly we need to rule out cauda equina syndrome. I would also like a urinalysis, I think this is going to pan out to be mixed urge/stress incontinence.   ___________________________________________ Gwen Her. Dianah Field, M.D., ABFM., CAQSM. Primary Care and Sports Medicine [facility]  Adjunct Professor of [employer] [facility] [employer]

## 2021-04-11 NOTE — Assessment & Plan Note (Signed)
History of stress incontinence, now complaining of complete incontinence and back pain, she did have a lumbar spine MRI in 2019 that was for the most part unremarkable, considering her current symptomatology we will going get another lumbar spine MRI today, adding x-rays/MRI. Certainly we need to rule out cauda equina syndrome. I would also like a urinalysis, I think this is going to pan out to be mixed urge/stress incontinence.

## 2021-04-11 NOTE — Assessment & Plan Note (Signed)
Annual physical exam, up-to-date on screening measures with the exception of colon cancer screening, adding Cologuard testing.

## 2021-04-12 LAB — URINALYSIS W MICROSCOPIC + REFLEX CULTURE
Bacteria, UA: NONE SEEN /HPF
Bilirubin Urine: NEGATIVE
Glucose, UA: NEGATIVE
Hgb urine dipstick: NEGATIVE
Ketones, ur: NEGATIVE
Leukocyte Esterase: NEGATIVE
Nitrites, Initial: NEGATIVE
Protein, ur: NEGATIVE
RBC / HPF: NONE SEEN /HPF (ref 0–2)
Specific Gravity, Urine: 1.013 (ref 1.001–1.035)
WBC, UA: NONE SEEN /HPF (ref 0–5)
pH: 5.5 (ref 5.0–8.0)

## 2021-04-12 LAB — NO CULTURE INDICATED

## 2021-04-13 ENCOUNTER — Telehealth (INDEPENDENT_AMBULATORY_CARE_PROVIDER_SITE_OTHER): Payer: Medicare Other | Admitting: Sports Medicine

## 2021-04-13 DIAGNOSIS — M47816 Spondylosis without myelopathy or radiculopathy, lumbar region: Secondary | ICD-10-CM

## 2021-04-13 NOTE — Progress Notes (Signed)
   Virtual Visit via Telephone   I connected with  [name]  on 04/13/21 by telephone/telehealth and verified that I am speaking with the correct person using two identifiers.   I discussed the limitations, risks, security and privacy concerns of performing an evaluation and management service by telephone, including the higher likelihood of inaccurate diagnosis and treatment, and the availability of in person appointments.  We also discussed the likely need of an additional face to face encounter for complete and high quality delivery of care.  I also discussed with the patient that there may be a patient responsible charge related to this service. The patient expressed understanding and wishes to proceed.  Provider location is in medical facility. Patient location is at their home, different from provider location. People involved in care of the patient during this telehealth encounter were myself, my nurse/medical assistant, and my front office/scheduling team member.  Review of Systems: No fevers, chills, night sweats, weight loss, chest pain, or shortness of breath.   Objective Findings:    General: Speaking full sentences, no audible heavy breathing.  Sounds alert and appropriately interactive.    Independent interpretation of tests performed by another provider:   MRI personally reviewed, there is multilevel lumbar facet arthritis however dominant finding is a very large right L4-L5 facet synovial cyst protruding into the spinal canal.  Brief History, Exam, Impression, and Recommendations:    Lumbar facet arthropathy (HCC) This pleasant 45 year old female returns to see me regarding her back pain, at the last visit she was having some bowel and bladder dysfunction so we got an urgent MRI, no evidence of cauda equina syndrome but she does have a fairly large right-sided L4-L5 facet synovial cyst in the spinal canal, considering this finding I think we should proceed with a right  L4-L5 facet synovial cyst and facet joint aspiration and injection, if she gets good relief from this were done, if not we will try multilevel lumbar facet joint injections from L3-S1 bilaterally.   I discussed the above assessment and treatment plan with the patient. The patient was provided an opportunity to ask questions and all were answered. The patient agreed with the plan and demonstrated an understanding of the instructions.   The patient was advised to call back or seek an in-person evaluation if the symptoms worsen or if the condition fails to improve as anticipated.   I provided 30 minutes of verbal and non-verbal time during this encounter date, time was needed to gather information, review chart, records, communicate/coordinate with staff remotely, as well as complete documentation.  Specifically we discussed the anatomical findings on the MRI and expectations of the procedure.   ___________________________________________ Gwen Her. Dianah Field, M.D., ABFM., CAQSM. Primary Care and Sports Medicine [facility]  Adjunct Professor of [place] [employer] [facility] [employer]

## 2021-04-13 NOTE — Assessment & Plan Note (Signed)
This pleasant 45 year old female returns to see me regarding her back pain, at the last visit she was having some bowel and bladder dysfunction so we got an urgent MRI, no evidence of cauda equina syndrome but she does have a fairly large right-sided L4-L5 facet synovial cyst in the spinal canal, considering this finding I think we should proceed with a right L4-L5 facet synovial cyst and facet joint aspiration and injection, if she gets good relief from this were done, if not we will try multilevel lumbar facet joint injections from L3-S1 bilaterally.

## 2021-04-14 ENCOUNTER — Ambulatory Visit: Payer: Medicare Other | Admitting: Obstetrics and Gynecology

## 2021-04-19 DIAGNOSIS — Z79891 Long term (current) use of opiate analgesic: Secondary | ICD-10-CM | POA: Diagnosis not present

## 2021-04-19 DIAGNOSIS — M170 Bilateral primary osteoarthritis of knee: Secondary | ICD-10-CM | POA: Diagnosis not present

## 2021-04-19 DIAGNOSIS — M5136 Other intervertebral disc degeneration, lumbar region: Secondary | ICD-10-CM | POA: Diagnosis not present

## 2021-04-19 DIAGNOSIS — G894 Chronic pain syndrome: Secondary | ICD-10-CM | POA: Diagnosis not present

## 2021-04-19 DIAGNOSIS — M5416 Radiculopathy, lumbar region: Secondary | ICD-10-CM | POA: Diagnosis not present

## 2021-04-20 ENCOUNTER — Ambulatory Visit
Admission: RE | Admit: 2021-04-20 | Discharge: 2021-04-20 | Disposition: A | Discharge status: Home / Self Care | Payer: Medicare Other | Source: Ambulatory Visit | Attending: Sports Medicine | Admitting: Sports Medicine

## 2021-04-20 ENCOUNTER — Other Ambulatory Visit: Payer: Medicare Other

## 2021-04-20 DIAGNOSIS — R7303 Prediabetes: Secondary | ICD-10-CM | POA: Diagnosis not present

## 2021-04-20 DIAGNOSIS — I10 Essential (primary) hypertension: Secondary | ICD-10-CM | POA: Diagnosis not present

## 2021-04-20 DIAGNOSIS — M47816 Spondylosis without myelopathy or radiculopathy, lumbar region: Secondary | ICD-10-CM

## 2021-04-20 DIAGNOSIS — M7138 Other bursal cyst, other site: Secondary | ICD-10-CM | POA: Diagnosis not present

## 2021-04-20 MED ORDER — IOPAMIDOL (ISOVUE-M 200) INJECTION 41%: 1.0000 mL | Freq: Once | INTRAMUSCULAR | Status: DC

## 2021-04-20 MED ORDER — METHYLPREDNISOLONE ACETATE 40 MG/ML INJ SUSP (RADIOLOG
80.0000 mg | Freq: Once | INTRAMUSCULAR | Status: AC
  Administered 2021-04-20: 80 mg via INTRA_ARTICULAR

## 2021-04-20 NOTE — Discharge Instructions (Signed)

## 2021-04-21 LAB — COMPREHENSIVE METABOLIC PANEL
AG Ratio: 1.9 (calc) (ref 1.0–2.5)
ALT: 19 U/L (ref 6–29)
AST: 20 U/L (ref 10–35)
Albumin: 4.4 g/dL (ref 3.6–5.1)
Alkaline phosphatase (APISO): 69 U/L (ref 31–125)
BUN/Creatinine Ratio: 20 (calc) (ref 6–22)
BUN: 23 mg/dL (ref 7–25)
CO2: 25 mmol/L (ref 20–32)
Calcium: 9.2 mg/dL (ref 8.6–10.2)
Chloride: 107 mmol/L (ref 98–110)
Creat: 1.13 mg/dL — ABNORMAL HIGH (ref 0.50–1.10)
Globulin: 2.3 g/dL (calc) (ref 1.9–3.7)
Glucose, Bld: 107 mg/dL — ABNORMAL HIGH (ref 65–99)
Potassium: 4.2 mmol/L (ref 3.5–5.3)
Sodium: 138 mmol/L (ref 135–146)
Total Bilirubin: 0.5 mg/dL (ref 0.2–1.2)
Total Protein: 6.7 g/dL (ref 6.1–8.1)

## 2021-04-21 LAB — CBC
HCT: 41.7 % (ref 35.0–45.0)
Hemoglobin: 13.2 g/dL (ref 11.7–15.5)
MCH: 27 pg (ref 27.0–33.0)
MCHC: 31.7 g/dL — ABNORMAL LOW (ref 32.0–36.0)
MCV: 85.3 fL (ref 80.0–100.0)
MPV: 10 fL (ref 7.5–12.5)
Platelets: 277 10*3/uL (ref 140–400)
RBC: 4.89 10*6/uL (ref 3.80–5.10)
RDW: 13.1 % (ref 11.0–15.0)
WBC: 5.8 10*3/uL (ref 3.8–10.8)

## 2021-04-21 LAB — LIPID PANEL
Cholesterol: 140 mg/dL (ref <200)
HDL: 45 mg/dL — ABNORMAL LOW (ref >=50)
LDL Cholesterol (Calc): 72 mg/dL (calc)
Non-HDL Cholesterol (Calc): 95 mg/dL (calc) (ref <130)
Total CHOL/HDL Ratio: 3.1 (calc) (ref <5.0)
Triglycerides: 148 mg/dL (ref <150)

## 2021-04-21 LAB — HEMOGLOBIN A1C
Hgb A1c MFr Bld: 6.1 % of total Hgb — ABNORMAL HIGH (ref <5.7)
Mean Plasma Glucose: 128 mg/dL
eAG (mmol/L): 7.1 mmol/L

## 2021-04-21 LAB — TSH: TSH: 1.77 mIU/L

## 2021-04-22 DIAGNOSIS — Z1211 Encounter for screening for malignant neoplasm of colon: Secondary | ICD-10-CM | POA: Diagnosis not present

## 2021-04-23 LAB — COLOGUARD: Cologuard: NEGATIVE

## 2021-04-25 ENCOUNTER — Ambulatory Visit (INDEPENDENT_AMBULATORY_CARE_PROVIDER_SITE_OTHER): Payer: Medicare Other | Admitting: Obstetrics & Gynecology

## 2021-04-25 ENCOUNTER — Encounter: Payer: Self-pay | Admitting: Obstetrics & Gynecology

## 2021-04-25 ENCOUNTER — Other Ambulatory Visit: Payer: Self-pay

## 2021-04-25 VITALS — BP 117/71 | HR 58 | Resp 16 | Ht 64.0 in | Wt 193.0 lb

## 2021-04-25 DIAGNOSIS — N6452 Nipple discharge: Secondary | ICD-10-CM

## 2021-04-25 DIAGNOSIS — N951 Menopausal and female climacteric states: Secondary | ICD-10-CM

## 2021-04-25 DIAGNOSIS — N898 Other specified noninflammatory disorders of vagina: Secondary | ICD-10-CM

## 2021-04-25 LAB — FOLLICLE STIMULATING HORMONE: FSH: 56 m[IU]/mL

## 2021-04-25 LAB — PROLACTIN: Prolactin: 4.7 ng/mL

## 2021-04-26 NOTE — Progress Notes (Signed)
   Subjective:    Patient ID: Evelyn Sanchez, female    DOB: 06/19/1976, 45 y.o.   MRN: [id number]  HPI  45 yo female presents for c/o menopausal symptoms, vaginal dryness, nipple discharge and return of menses after ablation.  Pt had no mneses for about 10 years after the ablation.  Patient started to have some spotting.  She had spotting in June and then several months before that had more spotting.  It is not occurring every month.  Evelyn Sanchez is going to keep a menstrual calendar.  If spotting becomes frequent or she goes a whole year and then has vaginal bleeding she is to call us.  Patient is most likely entering menopause given her symptoms and vaginal dryness.  Patient also complaining of a milky discharge from her breasts.  There is no blood.  Patient had a normal mammogram in October 2021.  Review of Systems  Constitutional: Negative.   Respiratory: Negative.    Cardiovascular: Negative.   Gastrointestinal: Negative.   Genitourinary: Negative.        Vaginal dryness  Psychiatric/Behavioral: Negative.        Objective:   Physical Exam Vitals reviewed.  Constitutional:      General: She is not in acute distress.    Appearance: She is well-developed.  HENT:     Head: Normocephalic and atraumatic.  Eyes:     Conjunctiva/sclera: Conjunctivae normal.  Cardiovascular:     Rate and Rhythm: Normal rate.  Pulmonary:     Effort: Pulmonary effort is normal.  Skin:    General: Skin is warm and dry.  Neurological:     Mental Status: She is alert and oriented to person, place, and time.  Psychiatric:        Mood and Affect: Mood normal.  Vitals:   04/25/21 1026  BP: 117/71  Pulse: (!) 58  Resp: 16  Weight: 193 lb (87.5 kg)  Height: 5' 4"  (1.626 m)      Assessment & Plan:  Evelyn Sanchez is a 45 year old female with perimenopausal symptoms.  Patient is going to keep a menstrual calendar on her phone to track spotting.  Irregular spotting several times a month is abnormal.  Heavy bleeding  would be abnormal as well.  Patient [name] is elevated today.  Patient is most likely entering menopause. Prolactin today is normal as well as TSH. If nipple discharge becomes bloody patient is to call us and we will get her an appointment with a breast surgeon. Patient to use Replens and Astra glide for vaginal dryness Patient to report her menstrual calendar [place] via MyChart in the next 3 to 4 months.  20 minutes spent with patient during exam and counseling.  I also reviewed records and documentation

## 2021-04-27 LAB — EXTERNAL GENERIC LAB PROCEDURE: COLOGUARD: NEGATIVE

## 2021-04-27 LAB — COLOGUARD: COLOGUARD: NEGATIVE

## 2021-05-04 DIAGNOSIS — M25462 Effusion, left knee: Secondary | ICD-10-CM | POA: Diagnosis not present

## 2021-05-04 DIAGNOSIS — G8929 Other chronic pain: Secondary | ICD-10-CM | POA: Diagnosis not present

## 2021-05-04 DIAGNOSIS — Z96652 Presence of left artificial knee joint: Secondary | ICD-10-CM | POA: Diagnosis not present

## 2021-05-04 DIAGNOSIS — Z888 Allergy status to other drugs, medicaments and biological substances status: Secondary | ICD-10-CM | POA: Diagnosis not present

## 2021-05-04 DIAGNOSIS — M25562 Pain in left knee: Secondary | ICD-10-CM | POA: Diagnosis not present

## 2021-05-11 DIAGNOSIS — M25562 Pain in left knee: Secondary | ICD-10-CM | POA: Diagnosis not present

## 2021-05-11 DIAGNOSIS — Z96652 Presence of left artificial knee joint: Secondary | ICD-10-CM | POA: Diagnosis not present

## 2021-05-11 DIAGNOSIS — G8929 Other chronic pain: Secondary | ICD-10-CM | POA: Diagnosis not present

## 2021-05-25 DIAGNOSIS — M25562 Pain in left knee: Secondary | ICD-10-CM | POA: Diagnosis not present

## 2021-05-25 DIAGNOSIS — T7849XS Other allergy, sequela: Secondary | ICD-10-CM | POA: Diagnosis not present

## 2021-05-25 DIAGNOSIS — T84033A Mechanical loosening of internal left knee prosthetic joint, initial encounter: Secondary | ICD-10-CM | POA: Diagnosis not present

## 2021-05-26 ENCOUNTER — Other Ambulatory Visit: Payer: Self-pay | Admitting: Sports Medicine

## 2021-05-31 ENCOUNTER — Other Ambulatory Visit: Payer: Self-pay | Admitting: Sports Medicine

## 2021-05-31 DIAGNOSIS — I10 Essential (primary) hypertension: Secondary | ICD-10-CM

## 2021-06-24 ENCOUNTER — Other Ambulatory Visit: Payer: Self-pay | Admitting: Sports Medicine

## 2021-06-24 DIAGNOSIS — G4701 Insomnia due to medical condition: Secondary | ICD-10-CM

## 2021-07-22 DIAGNOSIS — M5416 Radiculopathy, lumbar region: Secondary | ICD-10-CM | POA: Diagnosis not present

## 2021-07-22 DIAGNOSIS — M5136 Other intervertebral disc degeneration, lumbar region: Secondary | ICD-10-CM | POA: Diagnosis not present

## 2021-07-22 DIAGNOSIS — M170 Bilateral primary osteoarthritis of knee: Secondary | ICD-10-CM | POA: Diagnosis not present

## 2021-07-22 DIAGNOSIS — G894 Chronic pain syndrome: Secondary | ICD-10-CM | POA: Diagnosis not present

## 2021-08-08 ENCOUNTER — Ambulatory Visit (INDEPENDENT_AMBULATORY_CARE_PROVIDER_SITE_OTHER): Payer: Medicare Other | Admitting: Sports Medicine

## 2021-08-08 DIAGNOSIS — Z96652 Presence of left artificial knee joint: Secondary | ICD-10-CM | POA: Diagnosis not present

## 2021-08-08 DIAGNOSIS — Z471 Aftercare following joint replacement surgery: Secondary | ICD-10-CM | POA: Diagnosis not present

## 2021-08-08 DIAGNOSIS — Z0000 Encounter for general adult medical examination without abnormal findings: Secondary | ICD-10-CM

## 2021-08-08 DIAGNOSIS — Z1231 Encounter for screening mammogram for malignant neoplasm of breast: Secondary | ICD-10-CM

## 2021-08-08 DIAGNOSIS — R7303 Prediabetes: Secondary | ICD-10-CM | POA: Diagnosis not present

## 2021-08-08 DIAGNOSIS — T84033D Mechanical loosening of internal left knee prosthetic joint, subsequent encounter: Secondary | ICD-10-CM | POA: Diagnosis not present

## 2021-08-08 NOTE — Progress Notes (Signed)
MEDICARE ANNUAL WELLNESS VISIT  08/08/2021  Telephone Visit Disclaimer This Medicare AWV was conducted by telephone due to national recommendations for restrictions regarding the COVID-19 Pandemic (e.g. social distancing).  I verified, using two identifiers, that I am speaking with Evelyn Sanchez or their authorized healthcare agent. I discussed the limitations, risks, security, and privacy concerns of performing an evaluation and management service by telephone and the potential availability of an in-person appointment in the future. The patient expressed understanding and agreed to proceed.  Location of Patient: Home Location of Provider (nurse):  Provider home  Subjective:    Evelyn Sanchez is a 45 y.o. female patient of Thekkekandam, Evelyn Her, MD who had a Medicare Annual Wellness Visit today via telephone. [name] is Legally disabled and lives with their son. she has 2 children. she reports that she is socially active and does interact with friends/family regularly. she is minimally physically active and enjoys adult coloring and creating rugs.  Patient Care Team: Silverio Decamp, MD as PCP - General (Sports Medicine) Nahser, Wonda Cheng, MD as PCP - Cardiology (Cardiology)  Advanced Directives 08/08/2021 12/11/2017 07/29/2012 07/22/2012  Does Patient Have a Medical Advance Directive? No No - Patient does not have advance directive  Would patient like information on creating a medical advance directive? No - Patient declined - - -  Pre-existing out of facility DNR order (yellow form or pink MOST form) - - No -    Hospital Utilization Over the Past 12 Months: # of hospitalizations or ER visits: 0 # of surgeries: 0  Review of Systems    Patient reports that Sanchez overall health is unchanged compared to last year.  History obtained from chart review and the patient  Patient Reported Readings (BP, Pulse, CBG, Weight, etc) none  Pain Assessment Pain : No/denies pain      Current Medications & Allergies (verified) Allergies as of 08/08/2021       Reactions   Nickel Rash   Earrings cause redness and rash Earrings cause redness and rash   Ambien [zolpidem Tartrate] Other (See Comments)   hallucinations        Medication List        Accurate as of August 08, 2021 12:26 PM. If you have any questions, ask your nurse or doctor.          acetaminophen 650 MG CR tablet Commonly known as: TYLENOL Take 2 tablets (1,300 mg total) by mouth every 8 (eight) hours as needed for pain.   albuterol (2.5 MG/3ML) 0.083% nebulizer solution Commonly known as: PROVENTIL TAKE 3 MLS (2.5 MG TOTAL) BY NEBULIZATION EVERY 4 (FOUR) HOURS AS NEEDED FOR WHEEZING OR SHORTNESS OF BREATH (PLEASE INCLUDE NEBULIZER MACHINE, HOSES, AND MASK IF NEEDED.).   albuterol 108 (90 Base) MCG/ACT inhaler Commonly known as: VENTOLIN HFA INHALE 2 PUFFS BY MOUTH EVERY 6 HOURS AS NEEDED FOR WHEEZE   amLODipine 5 MG tablet Commonly known as: NORVASC TAKE 1 TABLET BY MOUTH EVERY DAY   aspirin EC 81 MG tablet Take 1 tablet (81 mg total) by mouth daily.   BENGAY EX Apply 1 application topically 2 (two) times daily as needed (for pain).   busPIRone 15 MG tablet Commonly known as: BUSPAR TAKE 1 TABLET BY MOUTH 3 TIMES A DAY   celecoxib 200 MG capsule Commonly known as: CELEBREX Take 200 mg by mouth daily.   DULoxetine 60 MG capsule Commonly known as: CYMBALTA TAKE 2 CAPSULES BY MOUTH EVERY DAY   fenofibrate 160  MG tablet TAKE 1 TABLET BY MOUTH EVERY DAY   gabapentin 300 MG capsule Commonly known as: NEURONTIN 1 capsule in the morning, 2 capsules in the evening   HYDROcodone-acetaminophen 10-325 MG tablet Commonly known as: NORCO Take 1 tablet by mouth 4 (four) times daily as needed.   ibuprofen 800 MG tablet Commonly known as: ADVIL Take 800 mg by mouth 3 (three) times daily.   lisinopril-hydrochlorothiazide 20-25 MG tablet Commonly known as: ZESTORETIC TAKE 1/2  TABLET BY MOUTH EVERY DAY   MULTIVITAMIN PO Take 1 tablet by mouth daily.   Nebulizer Air Tube/Plugs Misc Mask and tubing for use as needed   nitroGLYCERIN 0.4 MG SL tablet Commonly known as: NITROSTAT PLEASE SEE ATTACHED FOR DETAILED DIRECTIONS   omega-3 acid ethyl esters 1 g capsule Commonly known as: LOVAZA TAKE 2 CAPSULES (2 G TOTAL) BY MOUTH 2 (TWO) TIMES DAILY. What changed:  how much to take how to take this when to take this additional instructions   rizatriptan 10 MG tablet Commonly known as: MAXALT TAKE 1 TABLET BY MOUTH AS NEEDED FOR MIGRAINE. MAY REPEAT IN 2 HOURS IF NEEDED   tiZANidine 4 MG tablet Commonly known as: ZANAFLEX Take by mouth.   topiramate 50 MG tablet Commonly known as: TOPAMAX TAKE 1/2 TAB BY MOUTH NIGHTLY FOR 1 WEEK, THEN 1 TAB NIGHTLY FOR 1 WEEK,THEN 1 TAB TWICE DAILY   traMADol 100 MG 24 hr tablet Commonly known as: ULTRAM-ER Take 100 mg by mouth daily.   valACYclovir 1000 MG tablet Commonly known as: VALTREX Take 1 tablet (1,000 mg total) by mouth daily. Take for 5 days   zaleplon 10 MG capsule Commonly known as: SONATA TAKE 1 TO 2 CAPSULES BY MOUTH AT BEDTIME AS NEEDED FOR SLEEP        History (reviewed): Past Medical History:  Diagnosis Date   Abnormal pap 1999   colpo   Abnormal uterine bleeding 05/02/2012   Anxiety    Arthritis of both knees 2011   Back pain    Benign essential hypertension 10/22/2017   Bilateral hand numbness 03/02/2016   Bipolar I disorder, severe, current or most recent episode depressed, with rapid cycling ([place]) 06/11/2014   07/05/2018 PHQ 9 = 20, GAD7 = 15 08/02/2018 PHQ 9 = 14, GAD7 = 18 09/02/2018 PHQ 9 = 7, GAD7 = 7 09/30/2018 PHQ 9 = 8, GAD 7 = 8 06/10/2019 PHQ 9 = 8, GAD 7 = 16   Chest pain, typical 09/30/2018   Chlamydia    Chronic pain    Chronic pain syndrome 04/06/2016   Depression    Family history of ovarian cancer 05/02/2012   Femoral neuropathy of left lower extremity 11/26/2014    NCV/EMG: Chronic neurogenic changes to left vastus lateralis, could be old L4 radiculopathy, femoral neuropathy, focal muscle damage.  No other evidence of radiculopathy.    Fracture of coronoid process of ulna, left, closed 11/12/2014   Ganglion cyst of flexor tendon sheath of finger, right 07/05/2018   Ganglion cyst of wrist 01/01/2014   GERD (gastroesophageal reflux disease)    Hair loss 06/10/2019   Hypertension    Lateral epicondylitis of right elbow 11/15/2016   Lumbar facet arthropathy 03/08/2017   Malignant melanoma ([place]) 11/19/2017   Melanoma ([place])    Migraine without status migrainosus, not intractable 03/19/2014   Overview:  Last Assessment & Plan:  Per patient request referral to neurology.   Migraines    Obesity 02/03/2014   Ovarian cyst  Periapical abscess 01/08/2019   Pneumonia    Post traumatic stress disorder 03/09/2012   Premenstrual tension syndrome 02/10/2014   Overview:  Overview:  OCPs started 02/10/14   Primary osteoarthritis of left knee 01/01/2014   PTSD (post-traumatic stress disorder)    Severe acute respiratory syndrome coronavirus 2 (SARS-CoV-2) detected 10/14/2018   Shivering 03/05/2019   Trichomonas    Vaginal Pap smear, abnormal    Weakness of both legs 12/17/2012   Past Surgical History:  Procedure Laterality Date   ABLATION     ADENOIDECTOMY     APPENDECTOMY  1996   DILATION AND CURETTAGE OF UTERUS  1998   edometrial ablation     KNEE ARTHROSCOPY  1991   Left Kneex2   LYMPH NODE BIOPSY Left 12/11/2017   Procedure: SENTINEL LYMPH NODE MAPPING  BIOPSY;  Surgeon: Stark Klein, MD;  Location: [facility];  Service: General;  Laterality: Left;   MELANOMA EXCISION Left 12/11/2017   Procedure: EXCISION OF PRIOR SCAR LEFT SHOULDER MELANOMA WITH CLOSURE;  Surgeon: Stark Klein, MD;  Location: [facility];  Service: General;  Laterality: Left;   Tonsilectomy  1984   TONSILLECTOMY     TOTAL KNEE ARTHROPLASTY  12/18/2019   TOTAL KNEE REVISION     x2 01/09/20 and 01/20/20   TUBAL  LIGATION  1999   TUBAL LIGATION     tubes in ears     4x   TUBOPLASTY / TUBOTUBAL ANASTOMOSIS  1980   Four times between 1980-1990   WISDOM TOOTH EXTRACTION     Family History  Problem Relation Age of Onset   Hypertension Maternal Grandfather    Diabetes Maternal Grandfather    Stroke Maternal Grandfather    Stomach cancer Maternal Grandfather    Hypertension Maternal Grandmother    Cancer Maternal Grandmother        cervical   Hypertension Paternal Grandfather    Diabetes Paternal Grandfather    Hypertension Paternal Grandmother    Aneurysm Paternal Grandmother    Cerebral aneurysm Paternal Grandmother    Hypertension Mother    Depression Mother    Diabetes type II Mother    Diabetes Mother    Parkinson's disease Mother    Heart attack Father    Hypertension Father    Diabetes type II Father    Diabetes Father    Hyperlipidemia Father    Prostate cancer Father    ALS Father    Heart disease Father    Parkinson's disease Father    Depression Sister    Diabetes type II Sister    Diabetes Sister    High blood pressure Sister    Hyperlipidemia Brother    Cancer Maternal Aunt    Alcohol abuse Maternal Uncle    Cerebral aneurysm Maternal Uncle    Alcohol abuse Paternal Uncle    Social History   Socioeconomic History   Marital status: Legally Separated    Spouse name: Rosaria Ferries   Number of children: 2   Years of education: 12   Highest education level: High school graduate  Occupational History   Occupation: Legally disabled.  Tobacco Use   Smoking status: Former    Packs/day: 0.50    Years: 10.00    Pack years: 5.00    Types: Cigarettes    Quit date: 11/08/2009    Years since quitting: 11.7   Smokeless tobacco: Never  Vaping Use   Vaping Use: Never used  Substance and Sexual Activity   Alcohol use: No  Drug use: No   Sexual activity: Yes    Partners: Male    Birth control/protection: Other-see comments    Comment: Tubal Ligation  Other Topics Concern    Not on file  Social History Narrative   Lives with Sanchez son. She has been separated from Sanchez husband for over a year. She enjoys coloring and enjoys creating rugs.   Social Determinants of Health   Financial Resource Strain: Low Risk    Difficulty of Paying Living Expenses: Not hard at all  Food Insecurity: No Food Insecurity   Worried About Charity fundraiser in the Last Year: Never true   [place] in the Last Year: Never true  Transportation Needs: No Transportation Needs   Lack of Transportation (Medical): No   Lack of Transportation (Non-Medical): No  Physical Activity: Inactive   Days of Exercise per Week: 0 days   Minutes of Exercise per Session: 0 min  Stress: Stress Concern Present   Feeling of Stress : Rather much  Social Connections: Socially Isolated   Frequency of Communication with Friends and Family: Twice a week   Frequency of Social Gatherings with Friends and Family: Three times a week   Attends Religious Services: Never   Active Member of Clubs or Organizations: No   Attends Archivist Meetings: Never   Marital Status: Separated    Activities of Daily Living In your present state of health, do you have any difficulty performing the following activities: 08/08/2021  Hearing? Y  Comment has noticed difficulty in left ear.  Vision? Y  Comment has a hard time reading small print.  Difficulty concentrating or making decisions? Y  Comment has had some difficulty with all of this.  Walking or climbing stairs? Y  Comment yes due to Sanchez knee replacement. She is due to have surgery next week.  Dressing or bathing? Y  Comment needs assistance getting out of the shower.  Doing errands, shopping? Y  Comment She doesn't drive so she usually goes with someone.  Preparing Food and eating ? N  Using the Toilet? N  In the past six months, have you accidently leaked urine? Y  Comment has had some incontinence.  Do you have problems with loss of bowel  control? N  Managing your Medications? N  Managing your Finances? N  Housekeeping or managing your Housekeeping? N  Some recent data might be hidden    Patient Education/ Literacy How often do you need to have someone help you when you read instructions, pamphlets, or other written materials from your doctor or pharmacy?: 1 - Never What is the last grade level you completed in school?: 12th grade  Exercise Current Exercise Habits: The patient does not participate in regular exercise at present, Exercise limited by: orthopedic condition(s)  Diet Patient reports consuming 2 meals a day and 2 snack(s) a day Patient reports that Sanchez primary diet is: Regular Patient reports that she does have regular access to food.   Depression Screen PHQ 2/9 Scores 08/08/2021 04/11/2021 04/07/2020 07/01/2019 06/10/2019 09/30/2018 09/02/2018  PHQ - 2 Score 2 3 1 3 3 1 2   PHQ- 9 Score 10 10 6 13 8 8 7      Fall Risk Fall Risk  08/08/2021 04/07/2020  Falls in the past year? 1 1  Number falls in past yr: 1 0  Injury with Fall? 1 0  Risk for fall due to : History of fall(s);Impaired balance/gait;Impaired mobility -  Follow up Falls  evaluation completed;Education provided;Falls prevention discussed -     Objective:  Romi Rathel seemed alert and oriented and she participated appropriately during our telephone visit.  Blood Pressure Weight BMI  BP Readings from Last 3 Encounters:  04/25/21 117/71  04/20/21 138/80  04/11/21 114/73   Wt Readings from Last 3 Encounters:  04/25/21 193 lb (87.5 kg)  04/11/21 193 lb (87.5 kg)  10/07/20 194 lb 9.6 oz (88.3 kg)   BMI Readings from Last 1 Encounters:  04/25/21 33.13 kg/m    *Unable to obtain current vital signs, weight, and BMI due to telephone visit type  Hearing/Vision  Dasie did not seem to have difficulty with hearing/understanding during the telephone conversation Reports that she has not had a formal eye exam by an eye care professional within the  past year Reports that she has not had a formal hearing evaluation within the past year *Unable to fully assess hearing and vision during telephone visit type  Cognitive Function: 6CIT Screen 08/08/2021  What Year? 0 points  What month? 0 points  What time? 0 points  Count back from 20 0 points  Months in reverse 2 points  Repeat phrase 0 points  Total Score 2   (Normal:0-7, Significant for Dysfunction: >8)  Normal Cognitive Function Screening: Yes   Immunization & Health Maintenance Record Immunization History  Administered Date(s) Administered   Influenza Split 08/24/2011   Influenza, Seasonal, Injecte, Preservative Fre 07/22/2015, 08/31/2018   Influenza,inj,Quad PF,6+ Mos 11/15/2016, 10/22/2017, 08/31/2018, 06/29/2019, 07/23/2020   Influenza,trivalent, recombinat, inj, PF 08/24/2011   Influenza-Unspecified 07/22/2015   PFIZER Comirnaty(Gray Top)Covid-19 Tri-Sucrose Vaccine 12/06/2020, 12/27/2020   Tdap 12/13/2016    Health Maintenance  Topic Date Due   Pneumococcal Vaccine 66-46 Years old (1 - PCV) Never done   HIV Screening  Never done   Hepatitis C Screening  Never done   COLONOSCOPY (Pts 45-73yr Insurance coverage will need to be confirmed)  Never done   COVID-19 Vaccine (3 - Pfizer risk series) 01/24/2021   INFLUENZA VACCINE  05/16/2021   PAP SMEAR-Modifier  06/16/2022   TETANUS/TDAP  12/13/2026   HPV VACCINES  Aged Out       Assessment  This is a routine wellness examination for [employer]  Health Maintenance: Due or Overdue Health Maintenance Due  Topic Date Due   Pneumococcal Vaccine 17264Years old (1 - PCV) Never done   HIV Screening  Never done   Hepatitis C Screening  Never done   COLONOSCOPY (Pts 45-483yrInsurance coverage will need to be confirmed)  Never done   COVID-19 Vaccine (3 - Pfizer risk series) 01/24/2021   INFLUENZA VACCINE  05/16/2021    [place] not need a referral for Community Assistance: Care  Management:   no Social Work:    no Prescription Assistance:  no Nutrition/Diabetes Education:  no   Plan:  Personalized Goals  Goals Addressed               This Visit's Progress     Patient Stated (pt-stated)        08/08/2021 AWV Goal: Exercise for General Health  Patient will verbalize understanding of the benefits of increased physical activity: Exercising regularly is important. It will improve your overall fitness, flexibility, and endurance. Regular exercise also will improve your overall health. It can help you control your weight, reduce stress, and improve your bone density. Over the next year, patient will increase physical activity as tolerated with a goal of at least 150  minutes of moderate physical activity per week.  You can tell that you are exercising at a moderate intensity if your heart starts beating faster and you start breathing faster but can still hold a conversation. Moderate-intensity exercise ideas include: Walking 1 mile (1.6 km) in about 15 minutes Biking Hiking Golfing Dancing Water aerobics Patient will verbalize understanding of everyday activities that increase physical activity by providing examples like the following: Yard work, such as: Sales promotion account executive Gardening Washing windows or floors Patient will be able to explain general safety guidelines for exercising:  Before you start a new exercise program, talk with your health care provider. Do not exercise so much that you hurt yourself, feel dizzy, or get very short of breath. Wear comfortable clothes and wear shoes with good support. Drink plenty of water while you exercise to prevent dehydration or heat stroke. Work out until your breathing and your heartbeat get faster.        Personalized Health Maintenance & Screening Recommendations  Pneumococcal vaccine  Influenza vaccine Screening  mammography  Lung Cancer Screening Recommended: no (Low Dose CT Chest recommended if Age 42-80 years, 30 pack-year currently smoking OR have quit w/in past 15 years) Hepatitis C Screening recommended: yes HIV Screening recommended: yes  Advanced Directives: Written information was not prepared per patient's request.  Referrals & Orders No orders of the defined types were placed in this encounter.   Follow-up Plan Follow-up with Silverio Decamp, MD as planned Schedule your nurse visit for your flu and pneumonia vaccine (after discussing it with Dr. Darene Lamer).  Mammogram referral has been sent and they will call you to schedule.  Medicare wellness visit in one year.  Patient will access AVS on my chart.   I have personally reviewed and noted the following in the patient's chart:   Medical and social history Use of alcohol, tobacco or illicit drugs  Current medications and supplements Functional ability and status Nutritional status Physical activity Advanced directives List of other physicians Hospitalizations, surgeries, and ER visits in previous 12 months Vitals Screenings to include cognitive, depression, and falls Referrals and appointments  In addition, I have reviewed and discussed with Evelyn Sanchez certain preventive protocols, quality metrics, and best practice recommendations. A written personalized care plan for preventive services as well as general preventive health recommendations is available and can be mailed to the patient at Sanchez request.      Tinnie Gens, RN  08/08/2021

## 2021-08-08 NOTE — Patient Instructions (Addendum)
[place] Maintenance Summary and Written Plan of Care  Ms. Sprowl ,  Thank you for allowing me to perform your Medicare Annual Wellness Visit and for your ongoing commitment to your health.   Health Maintenance & Immunization History Health Maintenance  Topic Date Due  . COVID-19 Vaccine (3 - Pfizer risk series) 08/24/2021 (Originally 01/24/2021)  . INFLUENZA VACCINE  01/13/2022 (Originally 05/16/2021)  . Pneumococcal Vaccine 35-46 Years old (1 - PCV) 08/08/2022 (Originally 04/11/1981)  . MAMMOGRAM  08/08/2022 (Originally 07/28/2021)  . Hepatitis C Screening  08/08/2022 (Originally 04/11/1993)  . HIV Screening  08/08/2022 (Originally 04/11/1990)  . PAP SMEAR-Modifier  06/16/2022  . TETANUS/TDAP  12/13/2026  . HPV VACCINES  Aged Out   Immunization History  Administered Date(s) Administered  . Influenza Split 08/24/2011  . Influenza, Seasonal, Injecte, Preservative Fre 07/22/2015, 08/31/2018  . Influenza,inj,Quad PF,6+ Mos 11/15/2016, 10/22/2017, 08/31/2018, 06/29/2019, 07/23/2020  . Influenza,trivalent, recombinat, inj, PF 08/24/2011  . Influenza-Unspecified 07/22/2015  . PFIZER Comirnaty(Gray Top)Covid-19 Tri-Sucrose Vaccine 12/06/2020, 12/27/2020  . Tdap 12/13/2016    These are the patient goals that we discussed:  Goals Addressed              This Visit's Progress   .  Patient Stated (pt-stated)        08/08/2021 AWV Goal: Exercise for General Health  Patient will verbalize understanding of the benefits of increased physical activity: Exercising regularly is important. It will improve your overall fitness, flexibility, and endurance. Regular exercise also will improve your overall health. It can help you control your weight, reduce stress, and improve your bone density. Over the next year, patient will increase physical activity as tolerated with a goal of at least 150 minutes of moderate physical activity per week.  You can tell that you are  exercising at a moderate intensity if your heart starts beating faster and you start breathing faster but can still hold a conversation. Moderate-intensity exercise ideas include: Walking 1 mile (1.6 km) in about 15 minutes Biking Hiking Golfing Dancing Water aerobics Patient will verbalize understanding of everyday activities that increase physical activity by providing examples like the following: Yard work, such as: Sales promotion account executive Gardening Washing windows or floors Patient will be able to explain general safety guidelines for exercising:  Before you start a new exercise program, talk with your health care provider. Do not exercise so much that you hurt yourself, feel dizzy, or get very short of breath. Wear comfortable clothes and wear shoes with good support. Drink plenty of water while you exercise to prevent dehydration or heat stroke. Work out until your breathing and your heartbeat get faster.         This is a list of Health Maintenance Items that are overdue or due now: Pneumococcal vaccine  Influenza vaccine Screening mammography  Orders/Referrals Placed Today: Orders Placed This Encounter  Procedures  . Mammogram 3D SCREEN BREAST BILATERAL    Standing Status:   Future    Standing Expiration Date:   08/08/2022    Scheduling Instructions:     Please call patient to schedule.    Order Specific Question:   Reason for Exam (SYMPTOM  OR DIAGNOSIS REQUIRED)    Answer:   Breast cancer screening    Order Specific Question:   Is the patient pregnant?    Answer:   No    Order Specific Question:   Preferred imaging  location?    Answer:   [facility]    (Contact our referral department at [phone] if you have not spoken with someone about your referral appointment within the next 5 days)    Follow-up Plan Follow-up with Silverio Decamp, MD as planned Schedule  your nurse visit for your flu and pneumonia vaccine (after discussing it with Dr. Darene Lamer).  Mammogram referral has been sent and they will call you to schedule.  Medicare wellness visit in one year.  Patient will access AVS on my chart.      Health Maintenance, Female Adopting a healthy lifestyle and getting preventive care are important in promoting health and wellness. Ask your health care provider about: The right schedule for you to have regular tests and exams. Things you can do on your own to prevent diseases and keep yourself healthy. What should I know about diet, weight, and exercise? Eat a healthy diet  Eat a diet that includes plenty of vegetables, fruits, low-fat dairy products, and lean protein. Do not eat a lot of foods that are high in solid fats, added sugars, or sodium. Maintain a healthy weight Body mass index (BMI) is used to identify weight problems. It estimates body fat based on height and weight. Your health care provider can help determine your BMI and help you achieve or maintain a healthy weight. Get regular exercise Get regular exercise. This is one of the most important things you can do for your health. Most adults should: Exercise for at least 150 minutes each week. The exercise should increase your heart rate and make you sweat (moderate-intensity exercise). Do strengthening exercises at least twice a week. This is in addition to the moderate-intensity exercise. Spend less time sitting. Even light physical activity can be beneficial. Watch cholesterol and blood lipids Have your blood tested for lipids and cholesterol at 46 years of age, then have this test every 5 years. Have your cholesterol levels checked more often if: Your lipid or cholesterol levels are high. You are older than 46 years of age. You are at high risk for heart disease. What should I know about cancer screening? Depending on your health history and family history, you may need to have cancer  screening at various ages. This may include screening for: Breast cancer. Cervical cancer. Colorectal cancer. Skin cancer. Lung cancer. What should I know about heart disease, diabetes, and high blood pressure? Blood pressure and heart disease High blood pressure causes heart disease and increases the risk of stroke. This is more likely to develop in people who have high blood pressure readings, are of African descent, or are overweight. Have your blood pressure checked: Every 3-5 years if you are 16-77 years of age. Every year if you are 84 years old or older. Diabetes Have regular diabetes screenings. This checks your fasting blood sugar level. Have the screening done: Once every three years after age 32 if you are at a normal weight and have a low risk for diabetes. More often and at a younger age if you are overweight or have a high risk for diabetes. What should I know about preventing infection? Hepatitis B If you have a higher risk for hepatitis B, you should be screened for this virus. Talk with your health care provider to find out if you are at risk for hepatitis B infection. Hepatitis C Testing is recommended for: Everyone born from [birth year] through 1966. Anyone with known risk factors for hepatitis C. Sexually transmitted infections (STIs) Get screened  for STIs, including gonorrhea and chlamydia, if: You are sexually active and are younger than 46 years of age. You are older than 46 years of age and your health care provider tells you that you are at risk for this type of infection. Your sexual activity has changed since you were last screened, and you are at increased risk for chlamydia or gonorrhea. Ask your health care provider if you are at risk. Ask your health care provider about whether you are at high risk for HIV. Your health care provider may recommend a prescription medicine to help prevent HIV infection. If you choose to take medicine to prevent HIV, you should first  get tested for HIV. You should then be tested every 3 months for as long as you are taking the medicine. Pregnancy If you are about to stop having your period (premenopausal) and you may become pregnant, seek counseling before you get pregnant. Take 400 to 800 micrograms (mcg) of folic acid every day if you become pregnant. Ask for birth control (contraception) if you want to prevent pregnancy. Osteoporosis and menopause Osteoporosis is a disease in which the bones lose minerals and strength with aging. This can result in bone fractures. If you are 44 years old or older, or if you are at risk for osteoporosis and fractures, ask your health care provider if you should: Be screened for bone loss. Take a calcium or vitamin D supplement to lower your risk of fractures. Be given hormone replacement therapy (HRT) to treat symptoms of menopause. Follow these instructions at home: Lifestyle Do not use any products that contain nicotine or tobacco, such as cigarettes, e-cigarettes, and chewing tobacco. If you need help quitting, ask your health care provider. Do not use street drugs. Do not share needles. Ask your health care provider for help if you need support or information about quitting drugs. Alcohol use Do not drink alcohol if: Your health care provider tells you not to drink. You are pregnant, may be pregnant, or are planning to become pregnant. If you drink alcohol: Limit how much you use to 0-1 drink a day. Limit intake if you are breastfeeding. Be aware of how much alcohol is in your drink. In the [place] one drink equals one 12 oz bottle of beer (355 mL), one 5 oz glass of wine (148 mL), or one 1 oz glass of hard liquor (44 mL). General instructions Schedule regular health, dental, and eye exams. Stay current with your vaccines. Tell your health care provider if: You often feel depressed. You have ever been abused or do not feel safe at home. Summary Adopting a healthy lifestyle and  getting preventive care are important in promoting health and wellness. Follow your health care provider's instructions about healthy diet, exercising, and getting tested or screened for diseases. Follow your health care provider's instructions on monitoring your cholesterol and blood pressure. This information is not intended to replace advice given to you by your health care provider. Make sure you discuss any questions you have with your health care provider. Document Revised: 12/10/2020 Document Reviewed: 09/25/2018 Elsevier Patient Education  2022 [employer].

## 2021-08-11 ENCOUNTER — Other Ambulatory Visit: Payer: Self-pay | Admitting: Sports Medicine

## 2021-08-11 DIAGNOSIS — I10 Essential (primary) hypertension: Secondary | ICD-10-CM

## 2021-08-30 ENCOUNTER — Other Ambulatory Visit: Payer: Self-pay | Admitting: Sports Medicine

## 2021-08-30 DIAGNOSIS — F314 Bipolar disorder, current episode depressed, severe, without psychotic features: Secondary | ICD-10-CM

## 2021-09-01 ENCOUNTER — Other Ambulatory Visit: Payer: Self-pay

## 2021-09-01 ENCOUNTER — Ambulatory Visit (INDEPENDENT_AMBULATORY_CARE_PROVIDER_SITE_OTHER): Payer: Medicare Other

## 2021-09-01 DIAGNOSIS — Z0000 Encounter for general adult medical examination without abnormal findings: Secondary | ICD-10-CM

## 2021-09-01 DIAGNOSIS — Z1231 Encounter for screening mammogram for malignant neoplasm of breast: Secondary | ICD-10-CM | POA: Diagnosis not present

## 2021-09-01 DIAGNOSIS — M25562 Pain in left knee: Secondary | ICD-10-CM | POA: Diagnosis not present

## 2021-09-01 DIAGNOSIS — G8929 Other chronic pain: Secondary | ICD-10-CM | POA: Diagnosis not present

## 2021-09-01 DIAGNOSIS — T84033D Mechanical loosening of internal left knee prosthetic joint, subsequent encounter: Secondary | ICD-10-CM | POA: Diagnosis not present

## 2021-09-01 DIAGNOSIS — R262 Difficulty in walking, not elsewhere classified: Secondary | ICD-10-CM | POA: Diagnosis not present

## 2021-09-01 DIAGNOSIS — R29898 Other symptoms and signs involving the musculoskeletal system: Secondary | ICD-10-CM | POA: Diagnosis not present

## 2021-09-01 DIAGNOSIS — M25662 Stiffness of left knee, not elsewhere classified: Secondary | ICD-10-CM | POA: Diagnosis not present

## 2021-09-02 ENCOUNTER — Other Ambulatory Visit: Payer: Self-pay | Admitting: Sports Medicine

## 2021-09-02 DIAGNOSIS — F331 Major depressive disorder, recurrent, moderate: Secondary | ICD-10-CM

## 2021-09-02 DIAGNOSIS — R928 Other abnormal and inconclusive findings on diagnostic imaging of breast: Secondary | ICD-10-CM

## 2021-09-03 ENCOUNTER — Encounter: Payer: Self-pay | Admitting: Sports Medicine

## 2021-09-20 DIAGNOSIS — Z91048 Other nonmedicinal substance allergy status: Secondary | ICD-10-CM | POA: Diagnosis not present

## 2021-09-20 DIAGNOSIS — I10 Essential (primary) hypertension: Secondary | ICD-10-CM | POA: Diagnosis not present

## 2021-09-20 DIAGNOSIS — T8454XA Infection and inflammatory reaction due to internal left knee prosthesis, initial encounter: Secondary | ICD-10-CM | POA: Diagnosis not present

## 2021-09-20 DIAGNOSIS — Z96652 Presence of left artificial knee joint: Secondary | ICD-10-CM | POA: Diagnosis not present

## 2021-09-20 DIAGNOSIS — Z85820 Personal history of malignant melanoma of skin: Secondary | ICD-10-CM | POA: Diagnosis not present

## 2021-09-20 DIAGNOSIS — D62 Acute posthemorrhagic anemia: Secondary | ICD-10-CM | POA: Diagnosis not present

## 2021-09-20 DIAGNOSIS — Z79899 Other long term (current) drug therapy: Secondary | ICD-10-CM | POA: Diagnosis not present

## 2021-09-20 DIAGNOSIS — Z8614 Personal history of Methicillin resistant Staphylococcus aureus infection: Secondary | ICD-10-CM | POA: Diagnosis not present

## 2021-09-20 DIAGNOSIS — G43909 Migraine, unspecified, not intractable, without status migrainosus: Secondary | ICD-10-CM | POA: Diagnosis not present

## 2021-09-20 DIAGNOSIS — E785 Hyperlipidemia, unspecified: Secondary | ICD-10-CM | POA: Diagnosis not present

## 2021-09-20 DIAGNOSIS — G8918 Other acute postprocedural pain: Secondary | ICD-10-CM | POA: Diagnosis not present

## 2021-09-20 DIAGNOSIS — G4733 Obstructive sleep apnea (adult) (pediatric): Secondary | ICD-10-CM | POA: Diagnosis not present

## 2021-09-20 DIAGNOSIS — G8929 Other chronic pain: Secondary | ICD-10-CM | POA: Diagnosis not present

## 2021-09-20 DIAGNOSIS — Z87891 Personal history of nicotine dependence: Secondary | ICD-10-CM | POA: Diagnosis not present

## 2021-09-20 DIAGNOSIS — Z8616 Personal history of COVID-19: Secondary | ICD-10-CM | POA: Diagnosis not present

## 2021-09-20 DIAGNOSIS — Z472 Encounter for removal of internal fixation device: Secondary | ICD-10-CM | POA: Diagnosis not present

## 2021-09-20 DIAGNOSIS — M65862 Other synovitis and tenosynovitis, left lower leg: Secondary | ICD-10-CM | POA: Diagnosis not present

## 2021-09-20 DIAGNOSIS — T84033A Mechanical loosening of internal left knee prosthetic joint, initial encounter: Secondary | ICD-10-CM | POA: Diagnosis not present

## 2021-09-20 DIAGNOSIS — M25562 Pain in left knee: Secondary | ICD-10-CM | POA: Diagnosis not present

## 2021-09-20 DIAGNOSIS — Z888 Allergy status to other drugs, medicaments and biological substances status: Secondary | ICD-10-CM | POA: Diagnosis not present

## 2021-09-24 ENCOUNTER — Encounter: Payer: Self-pay | Admitting: Sports Medicine

## 2021-09-25 ENCOUNTER — Encounter: Payer: Self-pay | Admitting: Sports Medicine

## 2021-09-25 DIAGNOSIS — Z789 Other specified health status: Secondary | ICD-10-CM | POA: Diagnosis not present

## 2021-09-25 DIAGNOSIS — R7303 Prediabetes: Secondary | ICD-10-CM | POA: Diagnosis not present

## 2021-09-25 DIAGNOSIS — T8450XA Infection and inflammatory reaction due to unspecified internal joint prosthesis, initial encounter: Secondary | ICD-10-CM | POA: Diagnosis not present

## 2021-09-25 DIAGNOSIS — R7989 Other specified abnormal findings of blood chemistry: Secondary | ICD-10-CM | POA: Diagnosis not present

## 2021-09-25 DIAGNOSIS — D649 Anemia, unspecified: Secondary | ICD-10-CM | POA: Diagnosis not present

## 2021-09-25 DIAGNOSIS — I10 Essential (primary) hypertension: Secondary | ICD-10-CM | POA: Diagnosis not present

## 2021-09-30 ENCOUNTER — Encounter: Payer: Self-pay | Admitting: Sports Medicine

## 2021-09-30 ENCOUNTER — Telehealth (INDEPENDENT_AMBULATORY_CARE_PROVIDER_SITE_OTHER): Payer: Medicare Other | Admitting: Sports Medicine

## 2021-09-30 DIAGNOSIS — G971 Other reaction to spinal and lumbar puncture: Secondary | ICD-10-CM

## 2021-09-30 NOTE — Assessment & Plan Note (Signed)
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 She did have spinal anesthesia for this surgery, unfortunately when she woke up she had a severe headache and this headache has persisted except when laying flat. This does not feel like her typical migraines, no focal neurologic symptoms though signs cannot be assessed on a virtual visit. I think she has a post spinal puncture headache. She will take the Dilaudid prescribed by her surgical team on a scheduled basis 2-3 times a day, considering the severity of the headache as well as being on blood thinners we will do a CT head, as well as an MRI lumbar spine to ensure no CSF leak as she has having some back pain.

## 2021-09-30 NOTE — Progress Notes (Signed)
° °  Virtual Visit via Telephone   I connected with  Evelyn Sanchez  on 09/30/21 by telephone/telehealth and verified that I am speaking with the correct person using two identifiers.   I discussed the limitations, risks, security and privacy concerns of performing an evaluation and management service by telephone, including the higher likelihood of inaccurate diagnosis and treatment, and the availability of in person appointments.  We also discussed the likely need of an additional face to face encounter for complete and high quality delivery of care.  I also discussed with the patient that there may be a patient responsible charge related to this service. The patient expressed understanding and wishes to proceed.  Provider location is in medical facility. Patient location is at their home, different from provider location. People involved in care of the patient during this telehealth encounter were myself, my nurse/medical assistant, and my front office/scheduling team member.  Review of Systems: No fevers, chills, night sweats, weight loss, chest pain, or shortness of breath.   Objective Findings:    General: Speaking full sentences, no audible heavy breathing.  Sounds alert and appropriately interactive.    Independent interpretation of tests performed by another provider:   None.  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 She did have spinal anesthesia for this surgery, unfortunately when she woke up she had a severe headache and this headache has persisted except when laying flat. This does not feel like her typical migraines, no focal neurologic symptoms though signs cannot be assessed on a virtual visit. I think she has a post spinal puncture headache. She will take  the Dilaudid prescribed by her surgical team on a scheduled basis 2-3 times a day, considering the severity of the headache as well as being on blood thinners we will do a CT head, as well as an MRI lumbar spine to ensure no CSF leak as she has having some back pain.   I discussed the above assessment and treatment plan with the patient. The patient was provided an opportunity to ask questions and all were answered. The patient agreed with the plan and demonstrated an understanding of the instructions.   The patient was advised to call back or seek an in-person evaluation if the symptoms worsen or if the condition fails to improve as anticipated.   I provided 30 minutes of verbal and non-verbal time during this encounter date, time was needed to gather information, review chart, records, communicate/coordinate with staff remotely, as well as complete documentation.   ___________________________________________ Gwen Her. Dianah Field, M.D., ABFM., CAQSM. Primary Care and Sports Medicine [facility]  Adjunct Professor of [employer]

## 2021-10-01 DIAGNOSIS — T8450XA Infection and inflammatory reaction due to unspecified internal joint prosthesis, initial encounter: Secondary | ICD-10-CM | POA: Diagnosis not present

## 2021-10-02 ENCOUNTER — Other Ambulatory Visit: Payer: Self-pay

## 2021-10-02 ENCOUNTER — Ambulatory Visit (INDEPENDENT_AMBULATORY_CARE_PROVIDER_SITE_OTHER): Payer: Medicare Other

## 2021-10-02 DIAGNOSIS — Z96652 Presence of left artificial knee joint: Secondary | ICD-10-CM | POA: Diagnosis not present

## 2021-10-02 DIAGNOSIS — Z79899 Other long term (current) drug therapy: Secondary | ICD-10-CM | POA: Diagnosis not present

## 2021-10-02 DIAGNOSIS — M79605 Pain in left leg: Secondary | ICD-10-CM

## 2021-10-02 DIAGNOSIS — G8929 Other chronic pain: Secondary | ICD-10-CM

## 2021-10-02 DIAGNOSIS — G971 Other reaction to spinal and lumbar puncture: Secondary | ICD-10-CM

## 2021-10-02 DIAGNOSIS — T8454XA Infection and inflammatory reaction due to internal left knee prosthesis, initial encounter: Secondary | ICD-10-CM | POA: Diagnosis not present

## 2021-10-02 DIAGNOSIS — M545 Low back pain, unspecified: Secondary | ICD-10-CM | POA: Diagnosis not present

## 2021-10-02 DIAGNOSIS — R519 Headache, unspecified: Secondary | ICD-10-CM | POA: Diagnosis not present

## 2021-10-03 ENCOUNTER — Other Ambulatory Visit: Payer: Self-pay

## 2021-10-03 DIAGNOSIS — Z792 Long term (current) use of antibiotics: Secondary | ICD-10-CM | POA: Diagnosis not present

## 2021-10-03 DIAGNOSIS — Z09 Encounter for follow-up examination after completed treatment for conditions other than malignant neoplasm: Secondary | ICD-10-CM | POA: Diagnosis not present

## 2021-10-03 DIAGNOSIS — T8450XA Infection and inflammatory reaction due to unspecified internal joint prosthesis, initial encounter: Secondary | ICD-10-CM | POA: Diagnosis not present

## 2021-10-03 DIAGNOSIS — G43909 Migraine, unspecified, not intractable, without status migrainosus: Secondary | ICD-10-CM

## 2021-10-03 DIAGNOSIS — Z79899 Other long term (current) drug therapy: Secondary | ICD-10-CM | POA: Diagnosis not present

## 2021-10-03 DIAGNOSIS — B372 Candidiasis of skin and nail: Secondary | ICD-10-CM | POA: Diagnosis not present

## 2021-10-03 DIAGNOSIS — Z471 Aftercare following joint replacement surgery: Secondary | ICD-10-CM | POA: Diagnosis not present

## 2021-10-03 DIAGNOSIS — T8454XD Infection and inflammatory reaction due to internal left knee prosthesis, subsequent encounter: Secondary | ICD-10-CM | POA: Diagnosis not present

## 2021-10-03 DIAGNOSIS — Z96652 Presence of left artificial knee joint: Secondary | ICD-10-CM | POA: Diagnosis not present

## 2021-10-03 DIAGNOSIS — T8542XD Displacement of breast prosthesis and implant, subsequent encounter: Secondary | ICD-10-CM | POA: Diagnosis not present

## 2021-10-03 DIAGNOSIS — A4902 Methicillin resistant Staphylococcus aureus infection, unspecified site: Secondary | ICD-10-CM | POA: Diagnosis not present

## 2021-10-03 MED ORDER — TOPIRAMATE 50 MG PO TABS: 50.0000 mg | ORAL_TABLET | Freq: Two times a day (BID) | ORAL | 1 refills | Status: DC

## 2021-10-05 ENCOUNTER — Ambulatory Visit
Admission: RE | Admit: 2021-10-05 | Discharge: 2021-10-05 | Disposition: A | Discharge status: Home / Self Care | Payer: Medicare Other | Source: Ambulatory Visit | Attending: Sports Medicine | Admitting: Sports Medicine

## 2021-10-05 DIAGNOSIS — R928 Other abnormal and inconclusive findings on diagnostic imaging of breast: Secondary | ICD-10-CM

## 2021-10-05 DIAGNOSIS — R922 Inconclusive mammogram: Secondary | ICD-10-CM | POA: Diagnosis not present

## 2021-10-08 DIAGNOSIS — T8450XA Infection and inflammatory reaction due to unspecified internal joint prosthesis, initial encounter: Secondary | ICD-10-CM | POA: Diagnosis not present

## 2021-10-10 DIAGNOSIS — T8450XA Infection and inflammatory reaction due to unspecified internal joint prosthesis, initial encounter: Secondary | ICD-10-CM | POA: Diagnosis not present

## 2021-10-11 DIAGNOSIS — M25562 Pain in left knee: Secondary | ICD-10-CM | POA: Diagnosis not present

## 2021-10-16 DIAGNOSIS — T8450XA Infection and inflammatory reaction due to unspecified internal joint prosthesis, initial encounter: Secondary | ICD-10-CM | POA: Diagnosis not present

## 2021-10-17 DIAGNOSIS — T8450XA Infection and inflammatory reaction due to unspecified internal joint prosthesis, initial encounter: Secondary | ICD-10-CM | POA: Diagnosis not present

## 2021-10-18 DIAGNOSIS — G8929 Other chronic pain: Secondary | ICD-10-CM | POA: Diagnosis not present

## 2021-10-18 DIAGNOSIS — T84033D Mechanical loosening of internal left knee prosthetic joint, subsequent encounter: Secondary | ICD-10-CM | POA: Diagnosis not present

## 2021-10-18 DIAGNOSIS — R262 Difficulty in walking, not elsewhere classified: Secondary | ICD-10-CM | POA: Diagnosis not present

## 2021-10-18 DIAGNOSIS — M25562 Pain in left knee: Secondary | ICD-10-CM | POA: Diagnosis not present

## 2021-10-18 DIAGNOSIS — R29898 Other symptoms and signs involving the musculoskeletal system: Secondary | ICD-10-CM | POA: Diagnosis not present

## 2021-10-18 DIAGNOSIS — M25662 Stiffness of left knee, not elsewhere classified: Secondary | ICD-10-CM | POA: Diagnosis not present

## 2021-10-18 DIAGNOSIS — Z96652 Presence of left artificial knee joint: Secondary | ICD-10-CM | POA: Diagnosis not present

## 2021-10-21 DIAGNOSIS — M25562 Pain in left knee: Secondary | ICD-10-CM | POA: Diagnosis not present

## 2021-10-21 DIAGNOSIS — G8929 Other chronic pain: Secondary | ICD-10-CM | POA: Diagnosis not present

## 2021-10-21 DIAGNOSIS — M170 Bilateral primary osteoarthritis of knee: Secondary | ICD-10-CM | POA: Diagnosis not present

## 2021-10-21 DIAGNOSIS — M5136 Other intervertebral disc degeneration, lumbar region: Secondary | ICD-10-CM | POA: Diagnosis not present

## 2021-10-22 ENCOUNTER — Encounter: Payer: Self-pay | Admitting: Sports Medicine

## 2021-10-23 DIAGNOSIS — T8450XA Infection and inflammatory reaction due to unspecified internal joint prosthesis, initial encounter: Secondary | ICD-10-CM | POA: Diagnosis not present

## 2021-10-24 DIAGNOSIS — T8450XA Infection and inflammatory reaction due to unspecified internal joint prosthesis, initial encounter: Secondary | ICD-10-CM | POA: Diagnosis not present

## 2021-10-24 DIAGNOSIS — M25562 Pain in left knee: Secondary | ICD-10-CM | POA: Diagnosis not present

## 2021-10-24 MED ORDER — RIZATRIPTAN BENZOATE 10 MG PO TABS: 10.0000 mg | ORAL_TABLET | ORAL | 5 refills | Status: DC | PRN

## 2021-10-30 DIAGNOSIS — T8450XA Infection and inflammatory reaction due to unspecified internal joint prosthesis, initial encounter: Secondary | ICD-10-CM | POA: Diagnosis not present

## 2021-11-01 DIAGNOSIS — T8450XA Infection and inflammatory reaction due to unspecified internal joint prosthesis, initial encounter: Secondary | ICD-10-CM | POA: Diagnosis not present

## 2021-11-04 DIAGNOSIS — Z471 Aftercare following joint replacement surgery: Secondary | ICD-10-CM | POA: Diagnosis not present

## 2021-11-04 DIAGNOSIS — Z96652 Presence of left artificial knee joint: Secondary | ICD-10-CM | POA: Diagnosis not present

## 2021-11-04 DIAGNOSIS — T8454XD Infection and inflammatory reaction due to internal left knee prosthesis, subsequent encounter: Secondary | ICD-10-CM | POA: Diagnosis not present

## 2021-11-04 DIAGNOSIS — M25462 Effusion, left knee: Secondary | ICD-10-CM | POA: Diagnosis not present

## 2021-11-10 DIAGNOSIS — T84033A Mechanical loosening of internal left knee prosthetic joint, initial encounter: Secondary | ICD-10-CM | POA: Diagnosis not present

## 2021-11-10 DIAGNOSIS — Z01812 Encounter for preprocedural laboratory examination: Secondary | ICD-10-CM | POA: Diagnosis not present

## 2021-11-10 DIAGNOSIS — Z96652 Presence of left artificial knee joint: Secondary | ICD-10-CM | POA: Diagnosis not present

## 2021-11-10 DIAGNOSIS — D62 Acute posthemorrhagic anemia: Secondary | ICD-10-CM | POA: Diagnosis not present

## 2021-11-17 ENCOUNTER — Encounter: Payer: Self-pay | Admitting: Sports Medicine

## 2021-11-18 DIAGNOSIS — Z96652 Presence of left artificial knee joint: Secondary | ICD-10-CM | POA: Diagnosis not present

## 2021-11-18 DIAGNOSIS — T8454XD Infection and inflammatory reaction due to internal left knee prosthesis, subsequent encounter: Secondary | ICD-10-CM | POA: Diagnosis not present

## 2021-11-18 DIAGNOSIS — Z01818 Encounter for other preprocedural examination: Secondary | ICD-10-CM | POA: Diagnosis not present

## 2021-11-18 DIAGNOSIS — L905 Scar conditions and fibrosis of skin: Secondary | ICD-10-CM | POA: Diagnosis not present

## 2021-11-18 DIAGNOSIS — T84033D Mechanical loosening of internal left knee prosthetic joint, subsequent encounter: Secondary | ICD-10-CM | POA: Diagnosis not present

## 2021-11-18 DIAGNOSIS — R7309 Other abnormal glucose: Secondary | ICD-10-CM | POA: Diagnosis not present

## 2021-11-22 DIAGNOSIS — Z472 Encounter for removal of internal fixation device: Secondary | ICD-10-CM | POA: Diagnosis not present

## 2021-11-22 DIAGNOSIS — G8918 Other acute postprocedural pain: Secondary | ICD-10-CM | POA: Diagnosis not present

## 2021-11-22 DIAGNOSIS — Z96652 Presence of left artificial knee joint: Secondary | ICD-10-CM | POA: Diagnosis not present

## 2021-11-22 DIAGNOSIS — T8454XA Infection and inflammatory reaction due to internal left knee prosthesis, initial encounter: Secondary | ICD-10-CM | POA: Diagnosis not present

## 2021-11-22 DIAGNOSIS — M62838 Other muscle spasm: Secondary | ICD-10-CM | POA: Diagnosis not present

## 2021-11-22 DIAGNOSIS — M25562 Pain in left knee: Secondary | ICD-10-CM | POA: Diagnosis not present

## 2021-11-22 DIAGNOSIS — T8450XD Infection and inflammatory reaction due to unspecified internal joint prosthesis, subsequent encounter: Secondary | ICD-10-CM | POA: Diagnosis not present

## 2021-11-22 DIAGNOSIS — M67862 Other specified disorders of synovium, left knee: Secondary | ICD-10-CM | POA: Diagnosis not present

## 2021-11-22 DIAGNOSIS — Z471 Aftercare following joint replacement surgery: Secondary | ICD-10-CM | POA: Diagnosis not present

## 2021-11-22 DIAGNOSIS — M009 Pyogenic arthritis, unspecified: Secondary | ICD-10-CM | POA: Diagnosis not present

## 2021-11-22 DIAGNOSIS — M1712 Unilateral primary osteoarthritis, left knee: Secondary | ICD-10-CM | POA: Diagnosis not present

## 2021-11-22 DIAGNOSIS — B9562 Methicillin resistant Staphylococcus aureus infection as the cause of diseases classified elsewhere: Secondary | ICD-10-CM | POA: Diagnosis not present

## 2021-11-22 DIAGNOSIS — G8929 Other chronic pain: Secondary | ICD-10-CM | POA: Diagnosis not present

## 2021-11-22 DIAGNOSIS — Z96651 Presence of right artificial knee joint: Secondary | ICD-10-CM | POA: Diagnosis not present

## 2021-11-22 DIAGNOSIS — I10 Essential (primary) hypertension: Secondary | ICD-10-CM | POA: Diagnosis not present

## 2021-11-28 DIAGNOSIS — Z96652 Presence of left artificial knee joint: Secondary | ICD-10-CM | POA: Diagnosis not present

## 2021-11-28 DIAGNOSIS — Z4789 Encounter for other orthopedic aftercare: Secondary | ICD-10-CM | POA: Diagnosis not present

## 2021-11-29 DIAGNOSIS — Z96652 Presence of left artificial knee joint: Secondary | ICD-10-CM | POA: Diagnosis not present

## 2021-11-29 DIAGNOSIS — Z9889 Other specified postprocedural states: Secondary | ICD-10-CM | POA: Diagnosis not present

## 2021-11-29 DIAGNOSIS — D649 Anemia, unspecified: Secondary | ICD-10-CM | POA: Diagnosis not present

## 2021-11-30 DIAGNOSIS — Z471 Aftercare following joint replacement surgery: Secondary | ICD-10-CM | POA: Diagnosis not present

## 2021-11-30 DIAGNOSIS — Z96652 Presence of left artificial knee joint: Secondary | ICD-10-CM | POA: Diagnosis not present

## 2021-12-02 ENCOUNTER — Encounter: Payer: Self-pay | Admitting: Sports Medicine

## 2021-12-07 DIAGNOSIS — L24A9 Irritant contact dermatitis due friction or contact with other specified body fluids: Secondary | ICD-10-CM | POA: Diagnosis not present

## 2021-12-07 DIAGNOSIS — Z96652 Presence of left artificial knee joint: Secondary | ICD-10-CM | POA: Diagnosis not present

## 2021-12-07 DIAGNOSIS — Z471 Aftercare following joint replacement surgery: Secondary | ICD-10-CM | POA: Diagnosis not present

## 2021-12-19 DIAGNOSIS — M79662 Pain in left lower leg: Secondary | ICD-10-CM | POA: Diagnosis not present

## 2021-12-19 DIAGNOSIS — Z96652 Presence of left artificial knee joint: Secondary | ICD-10-CM | POA: Diagnosis not present

## 2021-12-19 DIAGNOSIS — M25562 Pain in left knee: Secondary | ICD-10-CM | POA: Diagnosis not present

## 2021-12-23 DIAGNOSIS — G4733 Obstructive sleep apnea (adult) (pediatric): Secondary | ICD-10-CM | POA: Diagnosis not present

## 2021-12-23 DIAGNOSIS — T8454XA Infection and inflammatory reaction due to internal left knee prosthesis, initial encounter: Secondary | ICD-10-CM | POA: Diagnosis not present

## 2021-12-23 DIAGNOSIS — E119 Type 2 diabetes mellitus without complications: Secondary | ICD-10-CM | POA: Diagnosis not present

## 2021-12-23 DIAGNOSIS — Z91048 Other nonmedicinal substance allergy status: Secondary | ICD-10-CM | POA: Diagnosis not present

## 2021-12-23 DIAGNOSIS — Z8614 Personal history of Methicillin resistant Staphylococcus aureus infection: Secondary | ICD-10-CM | POA: Diagnosis not present

## 2021-12-23 DIAGNOSIS — T8450XS Infection and inflammatory reaction due to unspecified internal joint prosthesis, sequela: Secondary | ICD-10-CM | POA: Diagnosis not present

## 2021-12-23 DIAGNOSIS — M79662 Pain in left lower leg: Secondary | ICD-10-CM | POA: Diagnosis not present

## 2021-12-23 DIAGNOSIS — R011 Cardiac murmur, unspecified: Secondary | ICD-10-CM | POA: Diagnosis not present

## 2021-12-23 DIAGNOSIS — R079 Chest pain, unspecified: Secondary | ICD-10-CM | POA: Diagnosis not present

## 2021-12-23 DIAGNOSIS — I10 Essential (primary) hypertension: Secondary | ICD-10-CM | POA: Diagnosis not present

## 2021-12-23 DIAGNOSIS — Z8616 Personal history of COVID-19: Secondary | ICD-10-CM | POA: Diagnosis not present

## 2021-12-23 DIAGNOSIS — R7303 Prediabetes: Secondary | ICD-10-CM | POA: Diagnosis not present

## 2021-12-23 DIAGNOSIS — B957 Other staphylococcus as the cause of diseases classified elsewhere: Secondary | ICD-10-CM | POA: Diagnosis not present

## 2021-12-23 DIAGNOSIS — D649 Anemia, unspecified: Secondary | ICD-10-CM | POA: Diagnosis not present

## 2021-12-23 DIAGNOSIS — Z79899 Other long term (current) drug therapy: Secondary | ICD-10-CM | POA: Diagnosis not present

## 2021-12-23 DIAGNOSIS — T84033A Mechanical loosening of internal left knee prosthetic joint, initial encounter: Secondary | ICD-10-CM | POA: Diagnosis not present

## 2021-12-23 DIAGNOSIS — Z7982 Long term (current) use of aspirin: Secondary | ICD-10-CM | POA: Diagnosis not present

## 2021-12-23 DIAGNOSIS — Z96652 Presence of left artificial knee joint: Secondary | ICD-10-CM | POA: Diagnosis not present

## 2021-12-23 DIAGNOSIS — G894 Chronic pain syndrome: Secondary | ICD-10-CM | POA: Diagnosis not present

## 2021-12-23 DIAGNOSIS — Z471 Aftercare following joint replacement surgery: Secondary | ICD-10-CM | POA: Diagnosis not present

## 2021-12-23 DIAGNOSIS — T8454XS Infection and inflammatory reaction due to internal left knee prosthesis, sequela: Secondary | ICD-10-CM | POA: Diagnosis not present

## 2021-12-23 DIAGNOSIS — B9562 Methicillin resistant Staphylococcus aureus infection as the cause of diseases classified elsewhere: Secondary | ICD-10-CM | POA: Diagnosis not present

## 2021-12-23 DIAGNOSIS — Z888 Allergy status to other drugs, medicaments and biological substances status: Secondary | ICD-10-CM | POA: Diagnosis not present

## 2021-12-23 DIAGNOSIS — M00062 Staphylococcal arthritis, left knee: Secondary | ICD-10-CM | POA: Diagnosis not present

## 2021-12-23 DIAGNOSIS — Z87891 Personal history of nicotine dependence: Secondary | ICD-10-CM | POA: Diagnosis not present

## 2021-12-23 DIAGNOSIS — G8918 Other acute postprocedural pain: Secondary | ICD-10-CM | POA: Diagnosis not present

## 2021-12-23 DIAGNOSIS — R519 Headache, unspecified: Secondary | ICD-10-CM | POA: Diagnosis not present

## 2021-12-23 DIAGNOSIS — Z85820 Personal history of malignant melanoma of skin: Secondary | ICD-10-CM | POA: Diagnosis not present

## 2021-12-26 ENCOUNTER — Other Ambulatory Visit: Payer: Self-pay | Admitting: Sports Medicine

## 2021-12-26 DIAGNOSIS — I10 Essential (primary) hypertension: Secondary | ICD-10-CM

## 2021-12-28 DIAGNOSIS — T8450XS Infection and inflammatory reaction due to unspecified internal joint prosthesis, sequela: Secondary | ICD-10-CM | POA: Diagnosis not present

## 2022-01-01 DIAGNOSIS — M25562 Pain in left knee: Secondary | ICD-10-CM | POA: Diagnosis not present

## 2022-01-01 DIAGNOSIS — T8450XD Infection and inflammatory reaction due to unspecified internal joint prosthesis, subsequent encounter: Secondary | ICD-10-CM | POA: Diagnosis not present

## 2022-01-01 DIAGNOSIS — T8450XA Infection and inflammatory reaction due to unspecified internal joint prosthesis, initial encounter: Secondary | ICD-10-CM | POA: Diagnosis not present

## 2022-01-05 DIAGNOSIS — B958 Unspecified staphylococcus as the cause of diseases classified elsewhere: Secondary | ICD-10-CM | POA: Diagnosis not present

## 2022-01-05 DIAGNOSIS — Z96652 Presence of left artificial knee joint: Secondary | ICD-10-CM | POA: Diagnosis not present

## 2022-01-05 DIAGNOSIS — A498 Other bacterial infections of unspecified site: Secondary | ICD-10-CM | POA: Diagnosis not present

## 2022-01-05 DIAGNOSIS — Z09 Encounter for follow-up examination after completed treatment for conditions other than malignant neoplasm: Secondary | ICD-10-CM | POA: Diagnosis not present

## 2022-01-05 DIAGNOSIS — Z792 Long term (current) use of antibiotics: Secondary | ICD-10-CM | POA: Diagnosis not present

## 2022-01-05 DIAGNOSIS — Z969 Presence of functional implant, unspecified: Secondary | ICD-10-CM | POA: Diagnosis not present

## 2022-01-05 DIAGNOSIS — T8454XD Infection and inflammatory reaction due to internal left knee prosthesis, subsequent encounter: Secondary | ICD-10-CM | POA: Diagnosis not present

## 2022-01-06 DIAGNOSIS — T8454XD Infection and inflammatory reaction due to internal left knee prosthesis, subsequent encounter: Secondary | ICD-10-CM | POA: Diagnosis not present

## 2022-01-08 DIAGNOSIS — A491 Streptococcal infection, unspecified site: Secondary | ICD-10-CM | POA: Diagnosis not present

## 2022-01-08 DIAGNOSIS — T8450XS Infection and inflammatory reaction due to unspecified internal joint prosthesis, sequela: Secondary | ICD-10-CM | POA: Diagnosis not present

## 2022-01-08 DIAGNOSIS — T8450XA Infection and inflammatory reaction due to unspecified internal joint prosthesis, initial encounter: Secondary | ICD-10-CM | POA: Diagnosis not present

## 2022-01-12 DIAGNOSIS — T8450XS Infection and inflammatory reaction due to unspecified internal joint prosthesis, sequela: Secondary | ICD-10-CM | POA: Diagnosis not present

## 2022-01-13 ENCOUNTER — Other Ambulatory Visit: Payer: Self-pay | Admitting: Sports Medicine

## 2022-01-13 DIAGNOSIS — I10 Essential (primary) hypertension: Secondary | ICD-10-CM

## 2022-01-15 DIAGNOSIS — T8450XA Infection and inflammatory reaction due to unspecified internal joint prosthesis, initial encounter: Secondary | ICD-10-CM | POA: Diagnosis not present

## 2022-01-16 DIAGNOSIS — T8450XS Infection and inflammatory reaction due to unspecified internal joint prosthesis, sequela: Secondary | ICD-10-CM | POA: Diagnosis not present

## 2022-01-16 DIAGNOSIS — Z96652 Presence of left artificial knee joint: Secondary | ICD-10-CM | POA: Diagnosis not present

## 2022-01-16 DIAGNOSIS — Z471 Aftercare following joint replacement surgery: Secondary | ICD-10-CM | POA: Diagnosis not present

## 2022-01-18 DIAGNOSIS — T8450XS Infection and inflammatory reaction due to unspecified internal joint prosthesis, sequela: Secondary | ICD-10-CM | POA: Diagnosis not present

## 2022-01-19 DIAGNOSIS — M25562 Pain in left knee: Secondary | ICD-10-CM | POA: Diagnosis not present

## 2022-01-19 DIAGNOSIS — M5136 Other intervertebral disc degeneration, lumbar region: Secondary | ICD-10-CM | POA: Diagnosis not present

## 2022-01-19 DIAGNOSIS — G8929 Other chronic pain: Secondary | ICD-10-CM | POA: Diagnosis not present

## 2022-01-19 DIAGNOSIS — M4807 Spinal stenosis, lumbosacral region: Secondary | ICD-10-CM | POA: Diagnosis not present

## 2022-01-19 DIAGNOSIS — G894 Chronic pain syndrome: Secondary | ICD-10-CM | POA: Diagnosis not present

## 2022-01-19 DIAGNOSIS — M47816 Spondylosis without myelopathy or radiculopathy, lumbar region: Secondary | ICD-10-CM | POA: Diagnosis not present

## 2022-01-19 DIAGNOSIS — Z79891 Long term (current) use of opiate analgesic: Secondary | ICD-10-CM | POA: Diagnosis not present

## 2022-01-19 DIAGNOSIS — M5416 Radiculopathy, lumbar region: Secondary | ICD-10-CM | POA: Diagnosis not present

## 2022-01-22 DIAGNOSIS — T8454XD Infection and inflammatory reaction due to internal left knee prosthesis, subsequent encounter: Secondary | ICD-10-CM | POA: Diagnosis not present

## 2022-01-22 DIAGNOSIS — T8450XA Infection and inflammatory reaction due to unspecified internal joint prosthesis, initial encounter: Secondary | ICD-10-CM | POA: Diagnosis not present

## 2022-01-26 DIAGNOSIS — T8450XS Infection and inflammatory reaction due to unspecified internal joint prosthesis, sequela: Secondary | ICD-10-CM | POA: Diagnosis not present

## 2022-01-29 DIAGNOSIS — I9789 Other postprocedural complications and disorders of the circulatory system, not elsewhere classified: Secondary | ICD-10-CM | POA: Diagnosis not present

## 2022-01-29 DIAGNOSIS — I2699 Other pulmonary embolism without acute cor pulmonale: Secondary | ICD-10-CM | POA: Diagnosis not present

## 2022-01-29 DIAGNOSIS — R0602 Shortness of breath: Secondary | ICD-10-CM | POA: Diagnosis not present

## 2022-01-29 DIAGNOSIS — I2693 Single subsegmental pulmonary embolism without acute cor pulmonale: Secondary | ICD-10-CM | POA: Diagnosis not present

## 2022-01-29 DIAGNOSIS — Z20822 Contact with and (suspected) exposure to covid-19: Secondary | ICD-10-CM | POA: Diagnosis not present

## 2022-01-30 ENCOUNTER — Encounter: Payer: Self-pay | Admitting: Sports Medicine

## 2022-01-30 DIAGNOSIS — I498 Other specified cardiac arrhythmias: Secondary | ICD-10-CM | POA: Diagnosis not present

## 2022-02-02 DIAGNOSIS — Z792 Long term (current) use of antibiotics: Secondary | ICD-10-CM | POA: Diagnosis not present

## 2022-02-02 DIAGNOSIS — Z969 Presence of functional implant, unspecified: Secondary | ICD-10-CM | POA: Diagnosis not present

## 2022-02-02 DIAGNOSIS — B3731 Acute candidiasis of vulva and vagina: Secondary | ICD-10-CM | POA: Diagnosis not present

## 2022-02-02 DIAGNOSIS — A498 Other bacterial infections of unspecified site: Secondary | ICD-10-CM | POA: Diagnosis not present

## 2022-02-02 DIAGNOSIS — T8454XD Infection and inflammatory reaction due to internal left knee prosthesis, subsequent encounter: Secondary | ICD-10-CM | POA: Diagnosis not present

## 2022-02-02 DIAGNOSIS — Z452 Encounter for adjustment and management of vascular access device: Secondary | ICD-10-CM | POA: Diagnosis not present

## 2022-02-06 ENCOUNTER — Ambulatory Visit (INDEPENDENT_AMBULATORY_CARE_PROVIDER_SITE_OTHER): Payer: Medicare Other | Admitting: Sports Medicine

## 2022-02-06 ENCOUNTER — Ambulatory Visit (INDEPENDENT_AMBULATORY_CARE_PROVIDER_SITE_OTHER): Payer: Medicare Other

## 2022-02-06 DIAGNOSIS — M79601 Pain in right arm: Secondary | ICD-10-CM | POA: Diagnosis not present

## 2022-02-06 DIAGNOSIS — I2699 Other pulmonary embolism without acute cor pulmonale: Secondary | ICD-10-CM | POA: Insufficient documentation

## 2022-02-06 DIAGNOSIS — Z452 Encounter for adjustment and management of vascular access device: Secondary | ICD-10-CM | POA: Diagnosis not present

## 2022-02-06 DIAGNOSIS — D509 Iron deficiency anemia, unspecified: Secondary | ICD-10-CM | POA: Diagnosis not present

## 2022-02-06 DIAGNOSIS — Z96652 Presence of left artificial knee joint: Secondary | ICD-10-CM

## 2022-02-06 DIAGNOSIS — M7989 Other specified soft tissue disorders: Secondary | ICD-10-CM | POA: Diagnosis not present

## 2022-02-06 DIAGNOSIS — I2694 Multiple subsegmental pulmonary emboli without acute cor pulmonale: Secondary | ICD-10-CM | POA: Diagnosis not present

## 2022-02-06 DIAGNOSIS — E66812 Obesity, class 2: Secondary | ICD-10-CM

## 2022-02-06 DIAGNOSIS — Z0389 Encounter for observation for other suspected diseases and conditions ruled out: Secondary | ICD-10-CM | POA: Diagnosis not present

## 2022-02-06 NOTE — Assessment & Plan Note (Signed)
Currently being treated for periprosthetic infection. ?On oral antibiotics for now. ?She does have a 2 cm leg length discrepancy on the left so we will add a heel lift. ?

## 2022-02-06 NOTE — Assessment & Plan Note (Signed)
Hemoglobin historically was normal last year, more recently has been in the sevens to the nines, most recent last week was 9.9 likely due to blood loss anemia and chronic disease. ?Low MCV. ?We can recheck with iron indices in a month or 2. ?

## 2022-02-06 NOTE — Progress Notes (Signed)
? ? ?  Procedures performed today:   ? ?None. ? ?Independent interpretation of notes and tests performed by another provider:  ? ?None. ? ?Brief History, Exam, Impression, and Recommendations:   ? 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 ?Unfortunately she started to have some shortness of breath and chest pain, was seen in the ED, ultimately a CT angiography showed a small amount of clot burden right lower lobe subsegmental arteries. ?She was started on Eliquis and referred back to me. ?PICC line has since been removed, there is no right heart strain on CT. ?She does have some tenderness right basilic vein around the site of the PICC line placement so we will get right upper extremity Dopplers as well as bilateral lower extremity venous Dopplers. ?Continue Eliquis 5 mg twice daily for 6 months. ? ?Microcytic anemia ?Hemoglobin historically was normal last year, more recently has been in the sevens to the nines, most recent last week was 9.9 likely due to blood loss anemia and chronic disease. ?Low MCV. ?We can recheck with iron indices in a month or 2. ? ?Left knee revision arthroplasty secondary to infection ?Currently being treated for periprosthetic infection. ?On oral antibiotics for now. ?She does have a 2 cm leg length discrepancy on the left so we will add a heel lift. ? ?Chronic process with exacerbation and pharmacologic intervention ? ?___________________________________________ ?Gwen Her. Dianah Field, M.D., ABFM., CAQSM. ?Primary Care and Sports Medicine ?[place] ? ?Adjunct Instructor of Family Medicine  ?[employer] ?

## 2022-02-06 NOTE — Assessment & Plan Note (Signed)
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 ?Unfortunately she started to have some shortness of breath and chest pain, was seen in the ED, ultimately a CT angiography showed a small amount of clot burden right lower lobe subsegmental arteries. ?She was started on Eliquis and referred back to me. ?PICC line has since been removed, there is no right heart strain on CT. ?She does have some tenderness right basilic vein around the site of the PICC line placement so we will get right upper extremity Dopplers as well as bilateral lower extremity venous Dopplers. ?Continue Eliquis 5 mg twice daily for 6 months. ?

## 2022-02-20 DIAGNOSIS — Z96652 Presence of left artificial knee joint: Secondary | ICD-10-CM | POA: Diagnosis not present

## 2022-02-20 DIAGNOSIS — T8454XD Infection and inflammatory reaction due to internal left knee prosthesis, subsequent encounter: Secondary | ICD-10-CM | POA: Diagnosis not present

## 2022-02-20 DIAGNOSIS — M85862 Other specified disorders of bone density and structure, left lower leg: Secondary | ICD-10-CM | POA: Diagnosis not present

## 2022-02-20 DIAGNOSIS — Z471 Aftercare following joint replacement surgery: Secondary | ICD-10-CM | POA: Diagnosis not present

## 2022-03-06 DIAGNOSIS — T8454XD Infection and inflammatory reaction due to internal left knee prosthesis, subsequent encounter: Secondary | ICD-10-CM | POA: Diagnosis not present

## 2022-03-07 ENCOUNTER — Encounter: Payer: Self-pay | Admitting: Family Medicine

## 2022-03-07 ENCOUNTER — Encounter (INDEPENDENT_AMBULATORY_CARE_PROVIDER_SITE_OTHER): Payer: Medicare Other | Admitting: Sports Medicine

## 2022-03-07 ENCOUNTER — Telehealth (INDEPENDENT_AMBULATORY_CARE_PROVIDER_SITE_OTHER): Payer: Medicare Other | Admitting: Family Medicine

## 2022-03-07 DIAGNOSIS — U071 COVID-19: Secondary | ICD-10-CM | POA: Diagnosis not present

## 2022-03-07 DIAGNOSIS — I2694 Multiple subsegmental pulmonary emboli without acute cor pulmonale: Secondary | ICD-10-CM | POA: Diagnosis not present

## 2022-03-07 MED ORDER — NIRMATRELVIR/RITONAVIR (PAXLOVID)TABLET: 3.0000 | ORAL_TABLET | Freq: Two times a day (BID) | ORAL | 0 refills | Status: AC

## 2022-03-07 MED ORDER — APIXABAN 5 MG PO TABS: 5.0000 mg | ORAL_TABLET | Freq: Two times a day (BID) | ORAL | 1 refills | Status: DC

## 2022-03-07 NOTE — Telephone Encounter (Signed)
I spent 5 total minutes of online digital evaluation and management services in this patient-initiated request for online care.

## 2022-03-07 NOTE — Progress Notes (Signed)
Sxs began over 1 week ago. She tested positive this morning. She stated that she did have a fever x 2 days 101.7.

## 2022-03-07 NOTE — Progress Notes (Signed)
Virtual Visit via Video Note  I connected with Evelyn Sanchez on 03/07/22 at  4:20 PM EDT by a video enabled telemedicine application and verified that I am speaking with the correct person using two identifiers.   I discussed the limitations of evaluation and management by telemedicine and the availability of in person appointments. The patient expressed understanding and agreed to proceed.  Patient location: at home Provider location: in office  Subjective:    CC:   Chief Complaint  Patient presents with   Covid Positive    HPI:  46 year old female with a history of pulmonary embolism still currently on Eliquis who reports that starting about a week ago she started not feeling well with cough and mucus production.  She is also on antibiotics from infectious disease.  She is currently on cephalexin 500 mg, 2 capsules 3 times daily for staph infection.  She thought maybe it was just related to that and did not think much of it.  But starting over the weekend, 3 days ago, she started to feel worse she developed headaches and fever up to 101.7 as well as diarrhea..  She says she decided to test for COVID today and it came back positive.  She has had a mild sore throat.  She has been taking Mucinex DM and a second cough medicine.  She says the cough is keeping her up at night.  She has normal renal function.  Past medical history, Surgical history, Family history not pertinant except as noted below, Social history, Allergies, and medications have been entered into the medical record, reviewed, and corrections made.    Objective:    General: Speaking clearly in complete sentences without any shortness of breath.  Alert and oriented x3.  Normal judgment. No apparent acute distress.    Impression and Recommendations:    Problem List Items Addressed This Visit   None Visit Diagnoses     COVID-19    -  Primary   Relevant Medications   cephALEXin (KEFLEX) 500 MG capsule    nirmatrelvir/ritonavir EUA (PAXLOVID) 20 x 150 MG & 10 x 100MG TABS       No orders of the defined types were placed in this encounter.   Meds ordered this encounter  Medications   nirmatrelvir/ritonavir EUA (PAXLOVID) 20 x 150 MG & 10 x 100MG TABS    Sig: Take 3 tablets by mouth 2 (two) times daily for 5 days. (Take nirmatrelvir 150 mg two tablets twice daily for 5 days and ritonavir 100 mg one tablet twice daily for 5 days) Patient GFR is 76    Dispense:  30 tablet    Refill:  0    Please have patient cut her Eliquis in half while taking Paxlovid.  Take half a tab twice a day.   COVID-19-discussed symptomatic measures.  Also discussed recommendation for quarantine.  I am a little unsure of the exact start date.  Her symptoms started on this a week ago but she did not run a fever until 3 days ago.  So I almost wonder if she may have had another real illness and then developed COVID after that so would err on the side of treating her within the 5-day window for with Paxlovid.  Did write instructions to the pharmacist to have her cut her Eliquis in half while she is taking that.  If she is not improving or feels like she is getting worse then please let us know.  I discussed the  assessment and treatment plan with the patient. The patient was provided an opportunity to ask questions and all were answered. The patient agreed with the plan and demonstrated an understanding of the instructions.   The patient was advised to call back or seek an in-person evaluation if the symptoms worsen or if the condition fails to improve as anticipated.   Evelyn Lecher, MD

## 2022-03-21 DIAGNOSIS — R7309 Other abnormal glucose: Secondary | ICD-10-CM | POA: Diagnosis not present

## 2022-03-21 DIAGNOSIS — D649 Anemia, unspecified: Secondary | ICD-10-CM | POA: Diagnosis not present

## 2022-03-21 DIAGNOSIS — D509 Iron deficiency anemia, unspecified: Secondary | ICD-10-CM | POA: Diagnosis not present

## 2022-03-22 ENCOUNTER — Encounter: Payer: Self-pay | Admitting: Sports Medicine

## 2022-03-22 DIAGNOSIS — D500 Iron deficiency anemia secondary to blood loss (chronic): Secondary | ICD-10-CM

## 2022-03-22 LAB — COMPREHENSIVE METABOLIC PANEL
AG Ratio: 1.6 (calc) (ref 1.0–2.5)
ALT: 15 U/L (ref 6–29)
AST: 18 U/L (ref 10–35)
Albumin: 4.4 g/dL (ref 3.6–5.1)
Alkaline phosphatase (APISO): 75 U/L (ref 31–125)
BUN: 17 mg/dL (ref 7–25)
CO2: 24 mmol/L (ref 20–32)
Calcium: 9.7 mg/dL (ref 8.6–10.2)
Chloride: 106 mmol/L (ref 98–110)
Creat: 0.97 mg/dL (ref 0.50–0.99)
Globulin: 2.7 g/dL (calc) (ref 1.9–3.7)
Glucose, Bld: 134 mg/dL — ABNORMAL HIGH (ref 65–99)
Potassium: 4 mmol/L (ref 3.5–5.3)
Sodium: 137 mmol/L (ref 135–146)
Total Bilirubin: 0.4 mg/dL (ref 0.2–1.2)
Total Protein: 7.1 g/dL (ref 6.1–8.1)

## 2022-03-22 LAB — HEMOGLOBIN A1C
Hgb A1c MFr Bld: 6.5 % of total Hgb — ABNORMAL HIGH (ref <5.7)
Mean Plasma Glucose: 140 mg/dL
eAG (mmol/L): 7.7 mmol/L

## 2022-03-22 LAB — CBC
HCT: 36 % (ref 35.0–45.0)
Hemoglobin: 10.9 g/dL — ABNORMAL LOW (ref 11.7–15.5)
MCH: 21.1 pg — ABNORMAL LOW (ref 27.0–33.0)
MCHC: 30.3 g/dL — ABNORMAL LOW (ref 32.0–36.0)
MCV: 69.6 fL — ABNORMAL LOW (ref 80.0–100.0)
MPV: 9.6 fL (ref 7.5–12.5)
Platelets: 489 10*3/uL — ABNORMAL HIGH (ref 140–400)
RBC: 5.17 10*6/uL — ABNORMAL HIGH (ref 3.80–5.10)
RDW: 19.5 % — ABNORMAL HIGH (ref 11.0–15.0)
WBC: 4.6 10*3/uL (ref 3.8–10.8)

## 2022-03-22 LAB — LIPID PANEL
Cholesterol: 205 mg/dL — ABNORMAL HIGH (ref <200)
HDL: 46 mg/dL — ABNORMAL LOW (ref >=50)
LDL Cholesterol (Calc): 119 mg/dL (calc) — ABNORMAL HIGH
Non-HDL Cholesterol (Calc): 159 mg/dL (calc) — ABNORMAL HIGH (ref <130)
Total CHOL/HDL Ratio: 4.5 (calc) (ref <5.0)
Triglycerides: 299 mg/dL — ABNORMAL HIGH (ref <150)

## 2022-03-22 LAB — TSH: TSH: 1.33 mIU/L

## 2022-03-22 LAB — B12 AND FOLATE PANEL
Folate: 7.9 ng/mL
Vitamin B-12: 567 pg/mL (ref 200–1100)

## 2022-03-22 LAB — IRON,TIBC AND FERRITIN PANEL
%SAT: 13 % (calc) — ABNORMAL LOW (ref 16–45)
Ferritin: 11 ng/mL — ABNORMAL LOW (ref 16–232)
Iron: 58 ug/dL (ref 40–190)
TIBC: 452 mcg/dL (calc) — ABNORMAL HIGH (ref 250–450)

## 2022-03-22 MED ORDER — FERROUS SULFATE 325 (65 FE) MG PO TBEC: 325.0000 mg | DELAYED_RELEASE_TABLET | Freq: Three times a day (TID) | ORAL | 11 refills | Status: DC

## 2022-03-22 NOTE — Assessment & Plan Note (Signed)
Last her hemoglobin was normal, she has had some extensive surgery and it had dropped so we initially assumed blood loss anemia. Unfortunately continuing to have anemia with indices suggesting iron deficiency, she is on Eliquis. I would like gastroenterology to weigh in.

## 2022-03-27 DIAGNOSIS — T8454XD Infection and inflammatory reaction due to internal left knee prosthesis, subsequent encounter: Secondary | ICD-10-CM | POA: Diagnosis not present

## 2022-03-27 DIAGNOSIS — Z792 Long term (current) use of antibiotics: Secondary | ICD-10-CM | POA: Diagnosis not present

## 2022-03-27 DIAGNOSIS — Z969 Presence of functional implant, unspecified: Secondary | ICD-10-CM | POA: Diagnosis not present

## 2022-03-27 DIAGNOSIS — A498 Other bacterial infections of unspecified site: Secondary | ICD-10-CM | POA: Diagnosis not present

## 2022-04-12 ENCOUNTER — Ambulatory Visit (INDEPENDENT_AMBULATORY_CARE_PROVIDER_SITE_OTHER): Payer: Medicare Other | Admitting: Sports Medicine

## 2022-04-12 ENCOUNTER — Encounter: Payer: Self-pay | Admitting: Sports Medicine

## 2022-04-12 VITALS — BP 124/70 | HR 79 | Resp 20 | Ht 64.0 in | Wt 184.0 lb

## 2022-04-12 DIAGNOSIS — Z0000 Encounter for general adult medical examination without abnormal findings: Secondary | ICD-10-CM | POA: Diagnosis not present

## 2022-04-12 DIAGNOSIS — C44219 Basal cell carcinoma of skin of left ear and external auricular canal: Secondary | ICD-10-CM | POA: Insufficient documentation

## 2022-04-12 DIAGNOSIS — H6192 Disorder of left external ear, unspecified: Secondary | ICD-10-CM

## 2022-04-12 DIAGNOSIS — D500 Iron deficiency anemia secondary to blood loss (chronic): Secondary | ICD-10-CM | POA: Diagnosis not present

## 2022-04-12 NOTE — Progress Notes (Signed)
Subjective:    CC: Annual Physical Exam  HPI:  This patient is here for their annual physical  I reviewed the past medical history, family history, social history, surgical history, and allergies today and no changes were needed.  Please see the problem list section below in epic for further details.  Past Medical History: Past Medical History:  Diagnosis Date   Abnormal pap 1999   colpo   Abnormal uterine bleeding 05/02/2012   Anxiety    Arthritis of both knees 2011   Back pain    Benign essential hypertension 10/22/2017   Bilateral hand numbness 03/02/2016   Bipolar I disorder, severe, current or most recent episode depressed, with rapid cycling ([place]) 06/11/2014   07/05/2018 PHQ 9 = 20, GAD7 = 15 08/02/2018 PHQ 9 = 14, GAD7 = 18 09/02/2018 PHQ 9 = 7, GAD7 = 7 09/30/2018 PHQ 9 = 8, GAD 7 = 8 06/10/2019 PHQ 9 = 8, GAD 7 = 16   Chest pain, typical 09/30/2018   Chlamydia    Chronic pain    Chronic pain syndrome 04/06/2016   Depression    Epidural anesthesia-induced headache during labor and delivery 09/30/2021   Family history of ovarian cancer 05/02/2012   Femoral neuropathy of left lower extremity 11/26/2014   NCV/EMG: Chronic neurogenic changes to left vastus lateralis, could be old L4 radiculopathy, femoral neuropathy, focal muscle damage.  No other evidence of radiculopathy.    Fracture of coronoid process of ulna, left, closed 11/12/2014   Ganglion cyst of flexor tendon sheath of finger, right 07/05/2018   Ganglion cyst of wrist 01/01/2014   GERD (gastroesophageal reflux disease)    Hair loss 06/10/2019   Hypertension    Lateral epicondylitis of right elbow 11/15/2016   Lumbar facet arthropathy 03/08/2017   Malignant melanoma ([place]) 11/19/2017   Melanoma ([place])    Migraine without status migrainosus, not intractable 03/19/2014   Overview:  Last Assessment & Plan:  Per patient request referral to neurology.   Migraines    Obesity 02/03/2014   Ovarian cyst    Periapical abscess 01/08/2019    Pneumonia    Post traumatic stress disorder 03/09/2012   Premenstrual tension syndrome 02/10/2014   Overview:  Overview:  OCPs started 02/10/14   Primary osteoarthritis of left knee 01/01/2014   PTSD (post-traumatic stress disorder)    Severe acute respiratory syndrome coronavirus 2 (SARS-CoV-2) detected 10/14/2018   Shivering 03/05/2019   Trichomonas    Vaginal Pap smear, abnormal    Weakness of both legs 12/17/2012   Past Surgical History: Past Surgical History:  Procedure Laterality Date   ABLATION     ADENOIDECTOMY     APPENDECTOMY  1996   DILATION AND CURETTAGE OF UTERUS  1998   edometrial ablation     KNEE ARTHROSCOPY  1991   Left Kneex2   LYMPH NODE BIOPSY Left 12/11/2017   Procedure: SENTINEL LYMPH NODE MAPPING  BIOPSY;  Surgeon: Stark Klein, MD;  Location: [facility];  Service: General;  Laterality: Left;   MELANOMA EXCISION Left 12/11/2017   Procedure: EXCISION OF PRIOR SCAR LEFT SHOULDER MELANOMA WITH CLOSURE;  Surgeon: Stark Klein, MD;  Location: [facility];  Service: General;  Laterality: Left;   Tonsilectomy  1984   TONSILLECTOMY     TOTAL KNEE ARTHROPLASTY  12/18/2019   TOTAL KNEE REVISION     x2 01/09/20 and 01/20/20   TUBAL LIGATION  1999   TUBAL LIGATION     tubes in ears  4x   TUBOPLASTY / TUBOTUBAL ANASTOMOSIS  1980   Four times between 1980-1990   WISDOM TOOTH EXTRACTION     Social History: Social History   Socioeconomic History   Marital status: Legally Separated    Spouse name: Rosaria Ferries   Number of children: 2   Years of education: 12   Highest education level: High school graduate  Occupational History   Occupation: Legally disabled.  Tobacco Use   Smoking status: Former    Packs/day: 0.50    Years: 10.00    Total pack years: 5.00    Types: Cigarettes    Quit date: 11/08/2009    Years since quitting: 12.4   Smokeless tobacco: Never  Vaping Use   Vaping Use: Never used  Substance and Sexual Activity   Alcohol use: No   Drug use: No   Sexual  activity: Yes    Partners: Male    Birth control/protection: Other-see comments    Comment: Tubal Ligation  Other Topics Concern   Not on file  Social History Narrative   Lives with her son. She has been separated from her husband for over a year. She enjoys coloring and enjoys creating rugs.   Social Determinants of Health   Financial Resource Strain: Low Risk  (08/08/2021)   Overall Financial Resource Strain (CARDIA)    Difficulty of Paying Living Expenses: Not hard at all  Food Insecurity: No Food Insecurity (08/08/2021)   Hunger Vital Sign    Worried About Running Out of Food in the Last Year: Never true    Ran Out of Food in the Last Year: Never true  Transportation Needs: No Transportation Needs (08/08/2021)   PRAPARE - Hydrologist (Medical): No    Lack of Transportation (Non-Medical): No  Physical Activity: Inactive (08/08/2021)   Exercise Vital Sign    Days of Exercise per Week: 0 days    Minutes of Exercise per Session: 0 min  Stress: Stress Concern Present (08/08/2021)   [place]    Feeling of Stress : Rather much  Social Connections: Socially Isolated (08/08/2021)   Social Connection and Isolation Panel [NHANES]    Frequency of Communication with Friends and Family: Twice a week    Frequency of Social Gatherings with Friends and Family: Three times a week    Attends Religious Services: Never    Active Member of Clubs or Organizations: No    Attends Archivist Meetings: Never    Marital Status: Separated   Family History: Family History  Problem Relation Age of Onset   Hypertension Mother    Depression Mother    Diabetes type II Mother    Diabetes Mother    Parkinson's disease Mother    Heart attack Father    Hypertension Father    Diabetes type II Father    Diabetes Father    Hyperlipidemia Father    Prostate cancer Father    ALS Father    Heart  disease Father    Parkinson's disease Father    Depression Sister    Diabetes type II Sister    Diabetes Sister    High blood pressure Sister    Cancer Maternal Aunt    Alcohol abuse Maternal Uncle    Cerebral aneurysm Maternal Uncle    Alcohol abuse Paternal Uncle    Hypertension Maternal Grandmother    Cancer Maternal Grandmother        cervical  Hypertension Maternal Grandfather    Diabetes Maternal Grandfather    Stroke Maternal Grandfather    Stomach cancer Maternal Grandfather    Hypertension Paternal Grandmother    Aneurysm Paternal Grandmother    Cerebral aneurysm Paternal Grandmother    Hypertension Paternal Grandfather    Diabetes Paternal Grandfather    Breast cancer Cousin        51s   Breast cancer Cousin        19s   Hyperlipidemia Brother    Allergies: Allergies  Allergen Reactions   Nickel Rash    Earrings cause redness and rash Earrings cause redness and rash    Ambien [Zolpidem Tartrate] Other (See Comments)    hallucinations   Medications: See med rec.  Review of Systems: No headache, visual changes, nausea, vomiting, diarrhea, constipation, dizziness, abdominal pain, skin rash, fevers, chills, night sweats, swollen lymph nodes, weight loss, chest pain, body aches, joint swelling, muscle aches, shortness of breath, mood changes, visual or auditory hallucinations.  Objective:    General: Well Developed, well nourished, and in no acute distress.  Neuro: Alert and oriented x3, extra-ocular muscles intact, sensation grossly intact. Cranial nerves II through XII are intact, motor, sensory, and coordinative functions are all intact. HEENT: Normocephalic, atraumatic, pupils equal round reactive to light, neck supple, no masses, no lymphadenopathy, thyroid nonpalpable. Oropharynx, nasopharynx, external ear canals are unremarkable. Skin: Warm and dry, no rashes noted.  Suspicious lesion left upper ear, question squamous cell carcinoma. Cardiac: Regular rate  and rhythm, no murmurs rubs or gallops.  Respiratory: Clear to auscultation bilaterally. Not using accessory muscles, speaking in full sentences.  Abdominal: Soft, nontender, nondistended, positive bowel sounds, no masses, no organomegaly.  Musculoskeletal: Shoulder, elbow, wrist, hip, knee, ankle stable, and with full range of motion.  Well-healed arthroplasty incision/scar left knee.  Impression and Recommendations:    The patient was counselled, risk factors were discussed, anticipatory guidance given.  Annual physical exam Annual physical as above, up-to-date on screening measures. Return in a year for this.  Skin lesion of left ear Scaly nonhealing lesion left upper ear, she will return for shave biopsy, I do suspect squamous cell carcinoma. She does have a history of melanoma.  Iron deficiency anemia Iron deficiency been noted on prior labs, she did have extensive surgery and it dropped initially, persistent anemia however so we got iron indices which were suggestive of iron deficiency, adding iron supplementation which she is tolerating well, continue this for now, feels a bit better. I think we should recheck her iron indices in a couple of weeks, she will have some lab work done at an outside hospital, hopefully we can add it to those.  I will go ahead and place the orders.   ____________________________________________ Gwen Her. Dianah Field, M.D., ABFM., CAQSM., AME. Primary Care and Sports Medicine [facility]  Adjunct Professor of [place] [facility] of Medicine  Risk manager

## 2022-04-12 NOTE — Assessment & Plan Note (Signed)
Annual physical as above, up-to-date on screening measures. Return in a year for this.

## 2022-04-12 NOTE — Assessment & Plan Note (Signed)
Scaly nonhealing lesion left upper ear, she will return for shave biopsy, I do suspect squamous cell carcinoma. She does have a history of melanoma.

## 2022-04-12 NOTE — Assessment & Plan Note (Signed)
Iron deficiency been noted on prior labs, she did have extensive surgery and it dropped initially, persistent anemia however so we got iron indices which were suggestive of iron deficiency, adding iron supplementation which she is tolerating well, continue this for now, feels a bit better. I think we should recheck her iron indices in a couple of weeks, she will have some lab work done at an outside hospital, hopefully we can add it to those.  I will go ahead and place the orders.

## 2022-04-15 ENCOUNTER — Other Ambulatory Visit: Payer: Self-pay | Admitting: Sports Medicine

## 2022-04-15 DIAGNOSIS — I10 Essential (primary) hypertension: Secondary | ICD-10-CM

## 2022-04-20 DIAGNOSIS — M25562 Pain in left knee: Secondary | ICD-10-CM | POA: Diagnosis not present

## 2022-04-20 DIAGNOSIS — M5416 Radiculopathy, lumbar region: Secondary | ICD-10-CM | POA: Diagnosis not present

## 2022-04-20 DIAGNOSIS — G894 Chronic pain syndrome: Secondary | ICD-10-CM | POA: Diagnosis not present

## 2022-04-20 DIAGNOSIS — G8929 Other chronic pain: Secondary | ICD-10-CM | POA: Diagnosis not present

## 2022-04-20 DIAGNOSIS — M5136 Other intervertebral disc degeneration, lumbar region: Secondary | ICD-10-CM | POA: Diagnosis not present

## 2022-04-24 ENCOUNTER — Encounter: Payer: Self-pay | Admitting: Sports Medicine

## 2022-04-24 ENCOUNTER — Other Ambulatory Visit: Payer: Self-pay | Admitting: Sports Medicine

## 2022-04-24 ENCOUNTER — Ambulatory Visit (INDEPENDENT_AMBULATORY_CARE_PROVIDER_SITE_OTHER): Payer: Medicare Other | Admitting: Sports Medicine

## 2022-04-24 DIAGNOSIS — H6192 Disorder of left external ear, unspecified: Secondary | ICD-10-CM | POA: Diagnosis not present

## 2022-04-24 DIAGNOSIS — T8454XD Infection and inflammatory reaction due to internal left knee prosthesis, subsequent encounter: Secondary | ICD-10-CM | POA: Diagnosis not present

## 2022-04-24 DIAGNOSIS — C44219 Basal cell carcinoma of skin of left ear and external auricular canal: Secondary | ICD-10-CM | POA: Diagnosis not present

## 2022-04-24 DIAGNOSIS — D500 Iron deficiency anemia secondary to blood loss (chronic): Secondary | ICD-10-CM | POA: Diagnosis not present

## 2022-04-24 NOTE — Assessment & Plan Note (Signed)
Question squamous cell carcinoma, chronically nonhealing left upper ear skin lesion, shave biopsy as above with hyfrecation, return to see me in 2 weeks for a wound check.

## 2022-04-24 NOTE — Assessment & Plan Note (Signed)
Iron deficiency anemia noted on prior labs, she did have extensive surgery and it initially dropped, persistent anemia however so we got iron indices that were suggestive of iron deficiency, she has been doing iron supplementation 3 times daily, feels better, rechecking labs.

## 2022-04-24 NOTE — Patient Instructions (Signed)
Incision Care, Adult An incision is a surgical cut that is made through your skin. Most incisions are closed after a surgical procedure. Your incision may be closed with stitches (sutures), staples, skin glue, or adhesive strips. You may need to return to your health care provider to have sutures or staples removed. This may occur several days or several weeks after your surgery. Until then, the incision needs to be cared for properly to prevent infection. Follow instructions from your health care provider about how to care for your incision. Supplies needed: Soap, water, and a clean hand towel. Wound cleanser. A clean bandage (dressing), if needed. Cream or ointment, if told by your health care provider. Clean gauze. How to care for your incision Cleaning the incision Ask your health care provider how to clean the incision. This may include: Wearing medical gloves. Using mild soap and water, or wound cleanser. Using a clean gauze to pat the incision dry after cleaning it. Dressing changes Wash your hands with soap and water for at least 20 seconds before and after you change the dressing. If soap and water are not available, use hand sanitizer. Do not use disinfectants or antiseptics, such as rubbing alcohol, to clean open wounds unless told by your health care provider. Change your dressing as told by your health care provider. Leave sutures, staples, skin glue, or adhesive strips in place. These skin closures may need to stay in place for 2 weeks or longer. If adhesive strip edges start to loosen and curl up, you may trim the loose edges. Do not remove adhesive strips completely unless your health care provider tells you to do that. Apply cream or ointment. Do this only as told by your health care provider. Cover the incision with a clean dressing. Ask your health care provider when you can start to leave the incision uncovered. Checking for infection Check your incision area every day for  signs of infection. Check for: More redness, swelling, or pain. More fluid or blood. New warmth, hardness, or a rash that develops along the incision. Pus or a bad smell.  Follow these instructions at home Medicines Take over-the-counter and prescription medicines only as told by your health care provider. If you were prescribed an antibiotic medicine, cream, or ointment, take or apply it as told by your health care provider. Do not stop using the antibiotic even if your condition improves. Eating and drinking Eat a diet that includes protein, vitamin A, vitamin C, and other nutrient-rich foods to help the wound heal. Foods rich in protein include meat, fish, eggs, dairy, beans, nuts, and protein supplement drinks. Foods rich in vitamin A include carrots and dark green, leafy vegetables. Foods rich in vitamin C include citrus fruits, tomatoes, broccoli, and peppers. Drink enough fluid to keep your urine pale yellow. General instructions  Do not take baths, swim, use a hot tub, or do anything that would put the incision underwater until your health care provider approves. Ask your health care provider if you may take showers. You may only be allowed to take sponge baths. Limit movement around your incision to promote healing. Avoid straining, lifting, or exercising for the first 2 weeks after your procedure, or for as long as told by your health care provider. Return to your normal activities as told by your health care provider. Ask your health care provider what activities are safe for you. Do not scratch, pick, or scrub the incision. Keep it covered as told by your health care provider.   Protect your incision from the sun when you are outside for the first 6 months, or for as long as told by your health care provider. Cover up the scar area or apply sunscreen that has an SPF of at least 30. Do not use any products that contain nicotine or tobacco, such as cigarettes, e-cigarettes, and  chewing tobacco. These can delay incision healing. If you need help quitting, ask your health care provider. Keep all follow-up visits. This is important. Contact a health care provider if: You have any of these signs of infection: More redness, swelling, or pain around your incision. More fluid or blood coming from your incision. New warmth or hardness around your incision. Pus or a bad smell coming from your incision. A rash that develops along the incision. You feel nauseous or you vomit. You are dizzy. Your sutures, staples, skin glue, or adhesive strips come undone. Your wound gets bigger. You have a fever. Get help right away if: Your incision bleeds through the dressing and the bleeding does not stop with gentle pressure. The edges of your incision open up and separate. These symptoms may represent a serious problem that is an emergency. Do not wait to see if the symptoms will go away. Get medical help right away. Call your local emergency services (911 in the [place] Do not drive yourself to the hospital. Summary Follow instructions from your health care provider about how to care for your incision. Wash your hands with soap and water for at least 20 seconds before and after you change the dressing. If soap and water are not available, use hand sanitizer. Check your incision area every day for signs of infection. Keep all follow-up visits. This is important. This information is not intended to replace advice given to you by your health care provider. Make sure you discuss any questions you have with your health care provider. Document Revised: 01/03/2021 Document Reviewed: 01/03/2021 Elsevier Patient Education  2023 Elsevier Inc.  

## 2022-04-24 NOTE — Progress Notes (Signed)
    Procedures performed today:    Procedure: Shave biopsy left-sided upper ear 1.1 cm suspicious lesion Risks, benefits, and alternatives explained and consent obtained. Time out conducted. Surface prepped with alcohol. 2cc lidocaine without epinephine infiltrated in a field block. Adequate anesthesia ensured. Area prepped and draped in a sterile fashion. Excision performed with: I used a DermaBlade to make a shave into the deep dermis and then used a-Kater to achieve hemostasis. Hemostasis achieved. Pt stable.  Independent interpretation of notes and tests performed by another provider:   None.  Brief History, Exam, Impression, and Recommendations:    Skin lesion of left ear Question squamous cell carcinoma, chronically nonhealing left upper ear skin lesion, shave biopsy as above with hyfrecation, return to see me in 2 weeks for a wound check.  Iron deficiency anemia Iron deficiency anemia noted on prior labs, she did have extensive surgery and it initially dropped, persistent anemia however so we got iron indices that were suggestive of iron deficiency, she has been doing iron supplementation 3 times daily, feels better, rechecking labs.    ____________________________________________ Gwen Her. Dianah Field, M.D., ABFM., CAQSM., AME. Primary Care and Sports Medicine [facility]  Adjunct Professor of [place] [employer] [facility] of Medicine  Risk manager

## 2022-04-24 NOTE — Addendum Note (Signed)
Addended by: Mertha Finders on: 04/24/2022 04:38 PM   Modules accepted: Orders

## 2022-04-25 ENCOUNTER — Encounter: Payer: Self-pay | Admitting: Sports Medicine

## 2022-04-25 LAB — IRON,TIBC AND FERRITIN PANEL
%SAT: 46 % (calc) — ABNORMAL HIGH (ref 16–45)
Ferritin: 23 ng/mL (ref 16–232)
Iron: 175 ug/dL (ref 40–190)
TIBC: 381 mcg/dL (calc) (ref 250–450)

## 2022-04-25 LAB — CBC
HCT: 43.1 % (ref 35.0–45.0)
Hemoglobin: 13.2 g/dL (ref 11.7–15.5)
MCH: 23.4 pg — ABNORMAL LOW (ref 27.0–33.0)
MCHC: 30.6 g/dL — ABNORMAL LOW (ref 32.0–36.0)
MCV: 76.6 fL — ABNORMAL LOW (ref 80.0–100.0)
MPV: 9.5 fL (ref 7.5–12.5)
Platelets: 332 10*3/uL (ref 140–400)
RBC: 5.63 10*6/uL — ABNORMAL HIGH (ref 3.80–5.10)
WBC: 6.2 10*3/uL (ref 3.8–10.8)

## 2022-04-27 ENCOUNTER — Encounter: Payer: Self-pay | Admitting: Sports Medicine

## 2022-04-27 DIAGNOSIS — H6192 Disorder of left external ear, unspecified: Secondary | ICD-10-CM

## 2022-04-27 NOTE — Telephone Encounter (Signed)
Referral pended for provider.

## 2022-05-08 ENCOUNTER — Ambulatory Visit (INDEPENDENT_AMBULATORY_CARE_PROVIDER_SITE_OTHER): Payer: Medicare Other | Admitting: Sports Medicine

## 2022-05-08 ENCOUNTER — Ambulatory Visit: Payer: Medicare Other | Admitting: Sports Medicine

## 2022-05-08 DIAGNOSIS — C44219 Basal cell carcinoma of skin of left ear and external auricular canal: Secondary | ICD-10-CM | POA: Diagnosis not present

## 2022-05-08 NOTE — Progress Notes (Signed)
    Procedures performed today:    None.  Independent interpretation of notes and tests performed by another provider:   None.  Brief History, Exam, Impression, and Recommendations:    Basal cell carcinoma (BCC) of auricle of left ear Evelyn Sanchez returns, she is a very pleasant 46 year old female with a history of a melanoma on her back, she had a nonhealing lesion chronically left upper auricle, I did a shave biopsy with aggressive hyfrecation, she had biopsy did return basal cell carcinoma, wound is healing well, we will keep an eye out for recurrence.    ____________________________________________ Gwen Her. Dianah Field, M.D., ABFM., CAQSM., AME. Primary Care and Sports Medicine [facility]  Adjunct Professor of [place] [employer] [facility] of Medicine  Risk manager

## 2022-05-08 NOTE — Assessment & Plan Note (Signed)
Evelyn Sanchez returns, she is a very pleasant 47 year old female with a history of a melanoma on her back, she had a nonhealing lesion chronically left upper auricle, I did a shave biopsy with aggressive hyfrecation, she had biopsy did return basal cell carcinoma, wound is healing well, we will keep an eye out for recurrence.

## 2022-06-02 ENCOUNTER — Other Ambulatory Visit: Payer: Self-pay | Admitting: Sports Medicine

## 2022-06-21 ENCOUNTER — Other Ambulatory Visit: Payer: Self-pay | Admitting: Sports Medicine

## 2022-06-21 DIAGNOSIS — G43909 Migraine, unspecified, not intractable, without status migrainosus: Secondary | ICD-10-CM

## 2022-06-21 DIAGNOSIS — A498 Other bacterial infections of unspecified site: Secondary | ICD-10-CM | POA: Diagnosis not present

## 2022-06-21 DIAGNOSIS — I10 Essential (primary) hypertension: Secondary | ICD-10-CM

## 2022-06-21 DIAGNOSIS — Z792 Long term (current) use of antibiotics: Secondary | ICD-10-CM | POA: Diagnosis not present

## 2022-06-21 DIAGNOSIS — T8454XD Infection and inflammatory reaction due to internal left knee prosthesis, subsequent encounter: Secondary | ICD-10-CM | POA: Diagnosis not present

## 2022-06-21 DIAGNOSIS — Z23 Encounter for immunization: Secondary | ICD-10-CM | POA: Diagnosis not present

## 2022-06-21 DIAGNOSIS — Z969 Presence of functional implant, unspecified: Secondary | ICD-10-CM | POA: Diagnosis not present

## 2022-06-23 ENCOUNTER — Other Ambulatory Visit: Payer: Self-pay | Admitting: Sports Medicine

## 2022-06-23 DIAGNOSIS — F331 Major depressive disorder, recurrent, moderate: Secondary | ICD-10-CM

## 2022-06-26 DIAGNOSIS — M25462 Effusion, left knee: Secondary | ICD-10-CM | POA: Diagnosis not present

## 2022-06-26 DIAGNOSIS — M25562 Pain in left knee: Secondary | ICD-10-CM | POA: Diagnosis not present

## 2022-06-26 DIAGNOSIS — G8929 Other chronic pain: Secondary | ICD-10-CM | POA: Diagnosis not present

## 2022-06-26 DIAGNOSIS — Z96652 Presence of left artificial knee joint: Secondary | ICD-10-CM | POA: Diagnosis not present

## 2022-06-26 DIAGNOSIS — G894 Chronic pain syndrome: Secondary | ICD-10-CM | POA: Diagnosis not present

## 2022-06-26 DIAGNOSIS — Z471 Aftercare following joint replacement surgery: Secondary | ICD-10-CM | POA: Diagnosis not present

## 2022-06-29 DIAGNOSIS — H5213 Myopia, bilateral: Secondary | ICD-10-CM | POA: Diagnosis not present

## 2022-07-10 ENCOUNTER — Ambulatory Visit (INDEPENDENT_AMBULATORY_CARE_PROVIDER_SITE_OTHER): Payer: Medicare Other | Admitting: Sports Medicine

## 2022-07-10 ENCOUNTER — Ambulatory Visit (INDEPENDENT_AMBULATORY_CARE_PROVIDER_SITE_OTHER): Payer: Medicare Other

## 2022-07-10 DIAGNOSIS — M47812 Spondylosis without myelopathy or radiculopathy, cervical region: Secondary | ICD-10-CM | POA: Diagnosis not present

## 2022-07-10 DIAGNOSIS — M5030 Other cervical disc degeneration, unspecified cervical region: Secondary | ICD-10-CM

## 2022-07-10 DIAGNOSIS — M542 Cervicalgia: Secondary | ICD-10-CM | POA: Diagnosis not present

## 2022-07-10 MED ORDER — PREDNISONE 50 MG PO TABS: ORAL_TABLET | ORAL | 0 refills | Status: DC

## 2022-07-10 NOTE — Assessment & Plan Note (Signed)
Evelyn Sanchez returns, she is having increasing pain in her neck with radiation to the right side of the face, as well as periscapular region and over the right shoulder but not past the elbow. She is also having some numbness and tingling in this location. She does have a history of melanoma. Reflexes are good albeit a bit more brisk on the right. Exam is otherwise unremarkable. Proceeding with x-rays, MRI due to history of melanoma, 5 days of prednisone and home physical therapy, return to see me in 6 weeks, epidural if not better. Of note she did have a negative nerve conduction and EMG in January 2022.

## 2022-07-10 NOTE — Progress Notes (Signed)
    Procedures performed today:    None.  Independent interpretation of notes and tests performed by another provider:   None.  Brief History, Exam, Impression, and Recommendations:    DDD (degenerative disc disease), cervical Evelyn Sanchez returns, she is having increasing pain in her neck with radiation to the right side of the face, as well as periscapular region and over the right shoulder but not past the elbow. She is also having some numbness and tingling in this location. She does have a history of melanoma. Reflexes are good albeit a bit more brisk on the right. Exam is otherwise unremarkable. Proceeding with x-rays, MRI due to history of melanoma, 5 days of prednisone and home physical therapy, return to see me in 6 weeks, epidural if not better. Of note she did have a negative nerve conduction and EMG in January 2022.  Chronic process with exacerbation and pharmacologic intervention  ____________________________________________ Gwen Her. Dianah Field, M.D., ABFM., CAQSM., AME. Primary Care and Sports Medicine [place] [facility]  Adjunct Professor of [place] [employer] of Medicine  Risk manager

## 2022-07-11 IMAGING — MG DIGITAL SCREENING BILAT W/ TOMO W/ CAD
8 of 14 series · 8 of 40 positions shown · non-contrast
Comparison: Previous exam(s).

CLINICAL DATA: Screening.

EXAM:
DIGITAL SCREENING BILATERAL MAMMOGRAM WITH TOMO AND CAD

[L MLO synth-2D (1 of 2)]
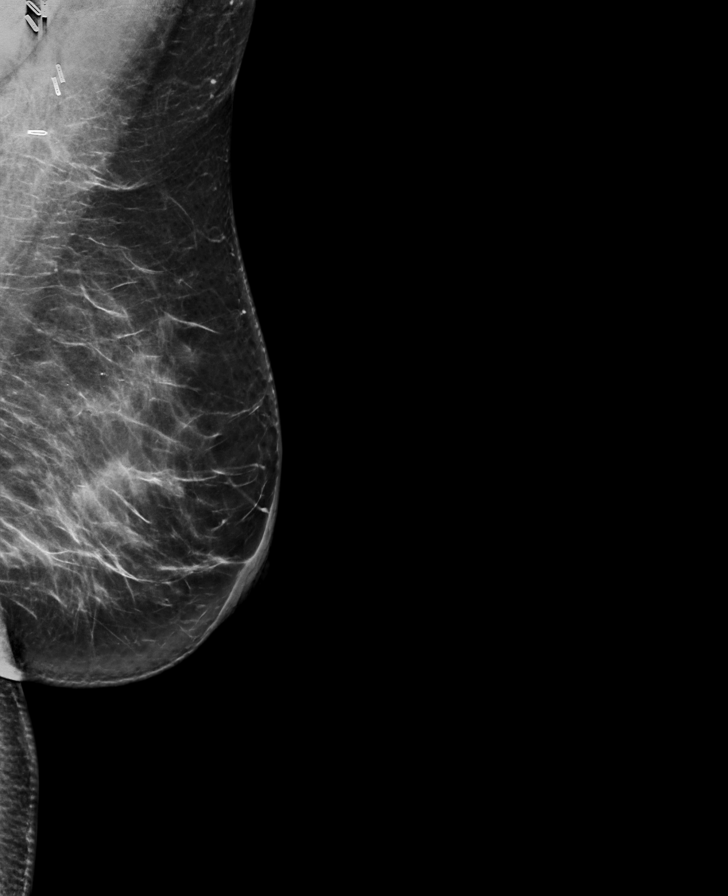

[R MLO synth-2D (1 of 2)]
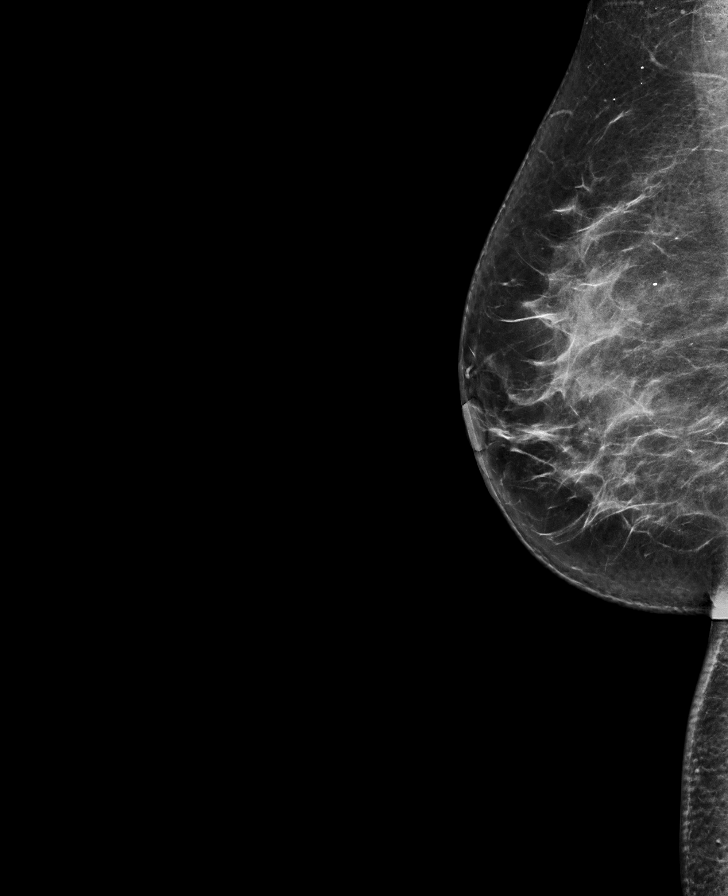

[L MLO synth-2D (2 of 2)]
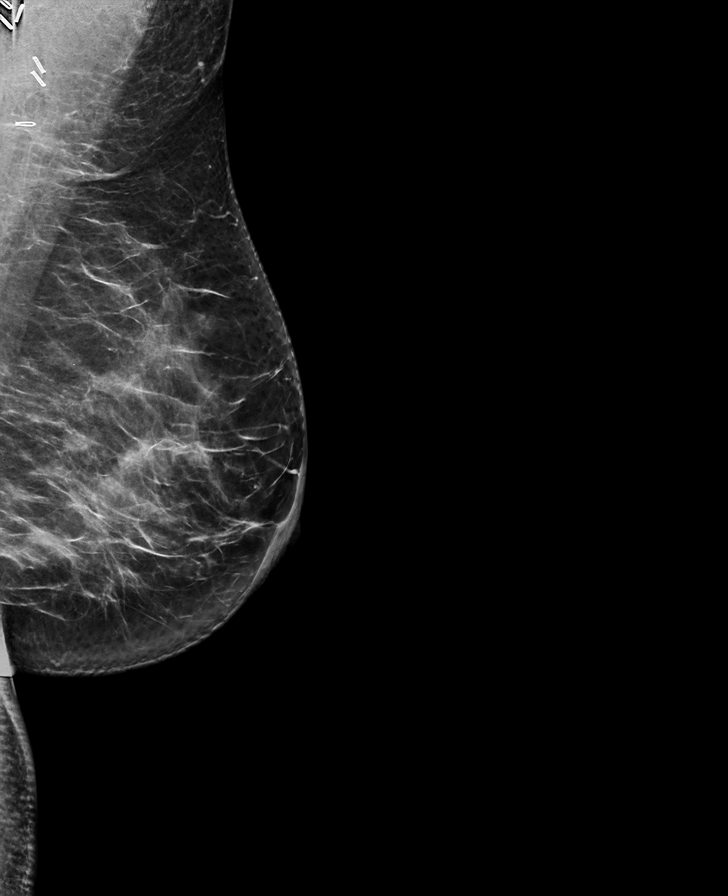

[R CC synth-2D]
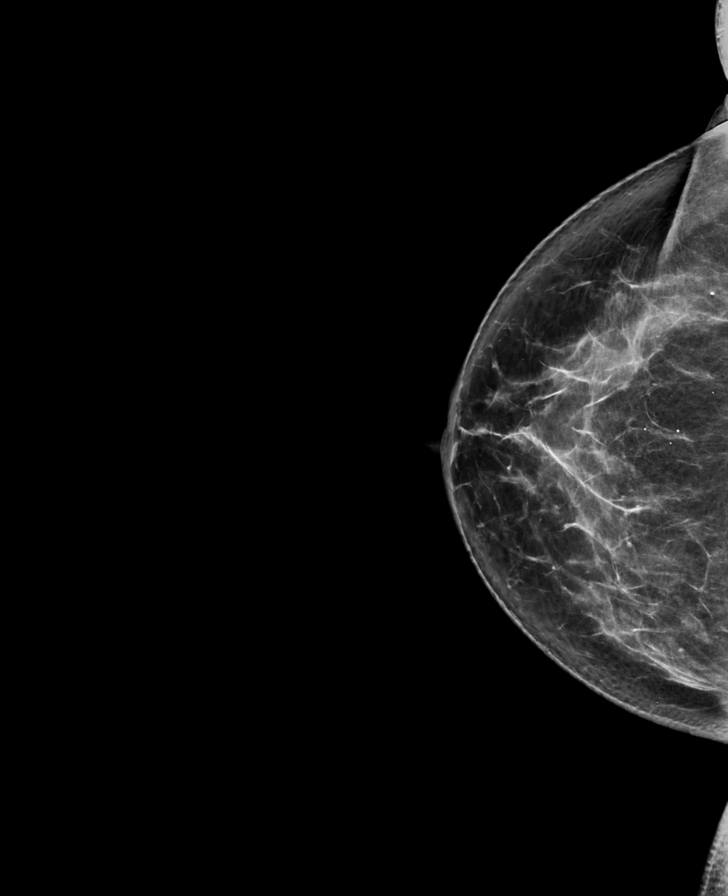

[L CC synth-2D]
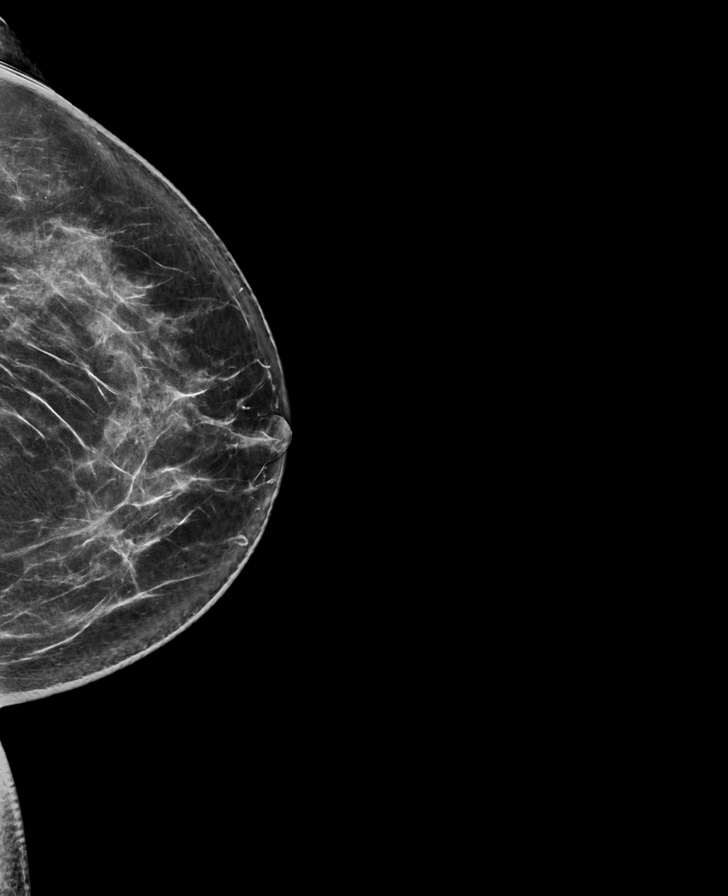

[R MLO synth-2D (2 of 2)]
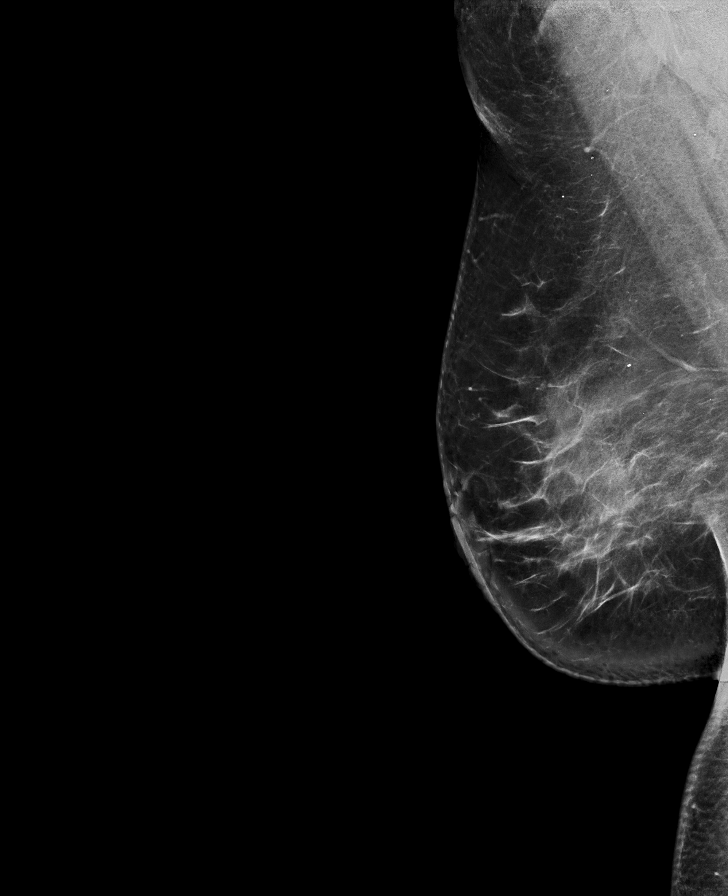

[R XCCL synth-2D]
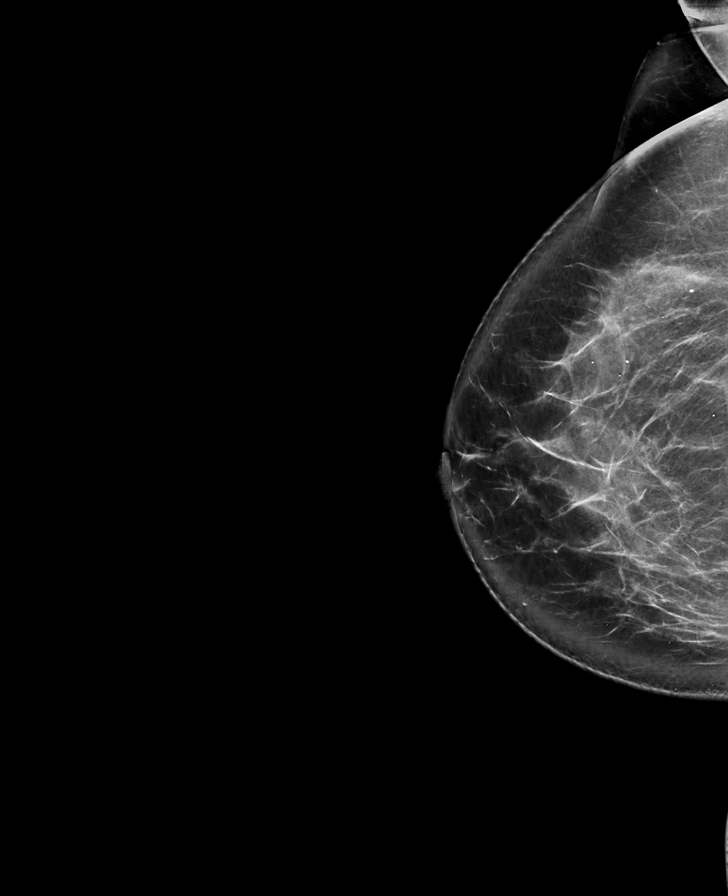

[R MLO tomo · tomo slice 49/96.0]
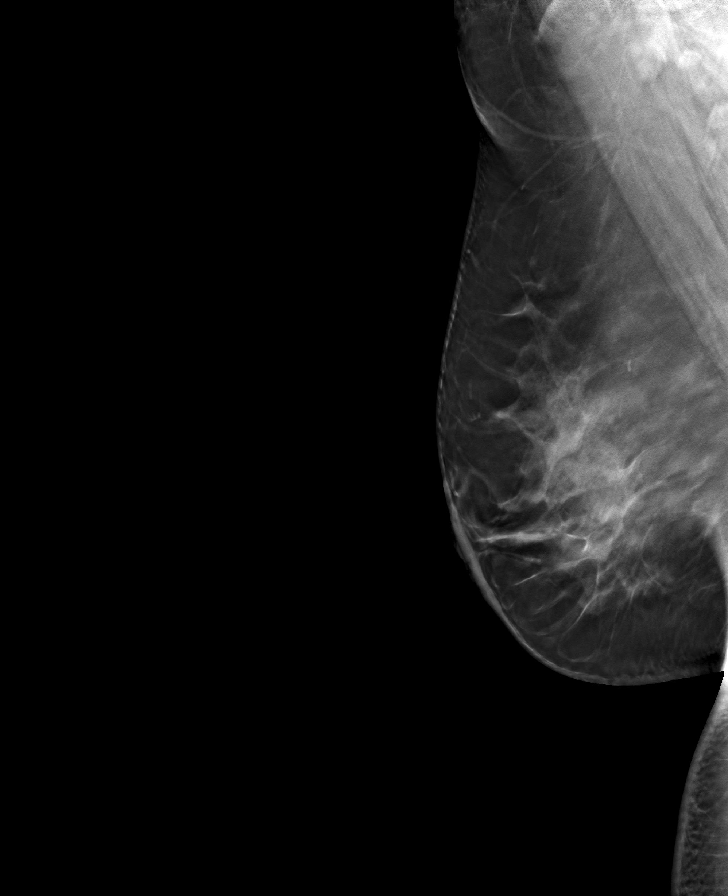

[8 of 40 positions shown; findings below may reference images not displayed]

ACR Breast Density Category c: The breast tissue is heterogeneously
dense, which may obscure small masses.
FINDINGS: There are no findings suspicious for malignancy. Images were
processed with CAD.
IMPRESSION: No mammographic evidence of malignancy. A result letter of this
screening mammogram will be mailed directly to the patient.

RECOMMENDATION:
Screening mammogram in one year. (Code:FT-U-LHB)

BI-RADS CATEGORY  1: Negative.

## 2022-07-12 ENCOUNTER — Other Ambulatory Visit: Payer: Self-pay | Admitting: Sports Medicine

## 2022-07-12 MED ORDER — TRIAZOLAM 0.25 MG PO TABS: ORAL_TABLET | ORAL | 0 refills | Status: DC

## 2022-07-15 ENCOUNTER — Ambulatory Visit (INDEPENDENT_AMBULATORY_CARE_PROVIDER_SITE_OTHER): Payer: Medicare Other

## 2022-07-15 DIAGNOSIS — Z85820 Personal history of malignant melanoma of skin: Secondary | ICD-10-CM | POA: Diagnosis not present

## 2022-07-15 DIAGNOSIS — M50121 Cervical disc disorder at C4-C5 level with radiculopathy: Secondary | ICD-10-CM | POA: Diagnosis not present

## 2022-07-15 DIAGNOSIS — M5011 Cervical disc disorder with radiculopathy,  high cervical region: Secondary | ICD-10-CM | POA: Diagnosis not present

## 2022-07-15 DIAGNOSIS — M4722 Other spondylosis with radiculopathy, cervical region: Secondary | ICD-10-CM | POA: Diagnosis not present

## 2022-07-15 DIAGNOSIS — M5030 Other cervical disc degeneration, unspecified cervical region: Secondary | ICD-10-CM

## 2022-07-18 ENCOUNTER — Encounter (INDEPENDENT_AMBULATORY_CARE_PROVIDER_SITE_OTHER): Payer: Self-pay

## 2022-07-18 DIAGNOSIS — H524 Presbyopia: Secondary | ICD-10-CM | POA: Diagnosis not present

## 2022-07-21 DIAGNOSIS — M5030 Other cervical disc degeneration, unspecified cervical region: Secondary | ICD-10-CM | POA: Diagnosis not present

## 2022-07-21 DIAGNOSIS — M5136 Other intervertebral disc degeneration, lumbar region: Secondary | ICD-10-CM | POA: Diagnosis not present

## 2022-07-21 DIAGNOSIS — G894 Chronic pain syndrome: Secondary | ICD-10-CM | POA: Diagnosis not present

## 2022-07-21 DIAGNOSIS — Z79891 Long term (current) use of opiate analgesic: Secondary | ICD-10-CM | POA: Diagnosis not present

## 2022-07-24 ENCOUNTER — Other Ambulatory Visit: Payer: Self-pay | Admitting: Sports Medicine

## 2022-08-21 ENCOUNTER — Ambulatory Visit (INDEPENDENT_AMBULATORY_CARE_PROVIDER_SITE_OTHER): Payer: Medicare Other | Admitting: Sports Medicine

## 2022-08-21 ENCOUNTER — Encounter: Payer: Self-pay | Admitting: Sports Medicine

## 2022-08-21 ENCOUNTER — Ambulatory Visit: Payer: Medicare Other | Admitting: Obstetrics & Gynecology

## 2022-08-21 VITALS — Wt 190.0 lb

## 2022-08-21 DIAGNOSIS — M5030 Other cervical disc degeneration, unspecified cervical region: Secondary | ICD-10-CM | POA: Diagnosis not present

## 2022-08-21 NOTE — Assessment & Plan Note (Signed)
Evelyn Sanchez returns, she was having increasing pain in the neck with radiation to the right side of the face, right periscapular region over the shoulder but not past the elbow, also with numbness and tingling. She did have a history of melanoma so we ordered an early MRI, MRI did show expected mild spondylitic changes of the cervical spine, she did have multiple small but broad-based disc protrusions, she did have foraminal stenosis bilaterally C4-C5 and C5-C6. We added prednisone and physical therapy at the last visit, she does have some pain still, but its not bad enough for her to consider a cervical epidural just yet, she will call me when she desires. Of note she did have a negative nerve conduction and EMG January 2022.

## 2022-08-21 NOTE — Progress Notes (Signed)
    Procedures performed today:    None.  Independent interpretation of notes and tests performed by another provider:   None.  Brief History, Exam, Impression, and Recommendations:    DDD (degenerative disc disease), cervical Evelyn Sanchez returns, she was having increasing pain in the neck with radiation to the right side of the face, right periscapular region over the shoulder but not past the elbow, also with numbness and tingling. She did have a history of melanoma so we ordered an early MRI, MRI did show expected mild spondylitic changes of the cervical spine, she did have multiple small but broad-based disc protrusions, she did have foraminal stenosis bilaterally C4-C5 and C5-C6. We added prednisone and physical therapy at the last visit, she does have some pain still, but its not bad enough for Sanchez to consider a cervical epidural just yet, she will call me when she desires. Of note she did have a negative nerve conduction and EMG January 2022.    ____________________________________________ Evelyn Sanchez. Evelyn Sanchez, M.D., ABFM., CAQSM., AME. Primary Care and Sports Medicine [facility]  Adjunct Professor of [employer] [facility] [employer]  Risk manager

## 2022-08-28 ENCOUNTER — Ambulatory Visit (INDEPENDENT_AMBULATORY_CARE_PROVIDER_SITE_OTHER): Payer: Medicare Other | Admitting: Obstetrics & Gynecology

## 2022-08-28 ENCOUNTER — Encounter: Payer: Self-pay | Admitting: Obstetrics & Gynecology

## 2022-08-28 ENCOUNTER — Other Ambulatory Visit (HOSPITAL_COMMUNITY)
Admission: RE | Admit: 2022-08-28 | Discharge: 2022-08-28 | Disposition: A | Discharge status: Home / Self Care | Payer: Medicare Other | Source: Ambulatory Visit | Attending: Obstetrics & Gynecology | Admitting: Obstetrics & Gynecology

## 2022-08-28 VITALS — BP 122/89 | HR 76 | Resp 16 | Ht 64.0 in | Wt 188.0 lb

## 2022-08-28 DIAGNOSIS — N951 Menopausal and female climacteric states: Secondary | ICD-10-CM | POA: Diagnosis not present

## 2022-08-28 DIAGNOSIS — Z01419 Encounter for gynecological examination (general) (routine) without abnormal findings: Secondary | ICD-10-CM

## 2022-08-28 DIAGNOSIS — Z0000 Encounter for general adult medical examination without abnormal findings: Secondary | ICD-10-CM

## 2022-08-28 DIAGNOSIS — Z1151 Encounter for screening for human papillomavirus (HPV): Secondary | ICD-10-CM | POA: Insufficient documentation

## 2022-08-28 NOTE — Progress Notes (Signed)
Last Mammogram: 10/05/21-  Last Pap Smear:  06/17/19- negative Last Colon Screening;  never done Seat Belts:   Yes Sun Screen:   Yes Dental Check Up:  Yes Brush & Floss:No teeth(dentures)

## 2022-08-28 NOTE — Progress Notes (Signed)
Subjective:     Evelyn Sanchez is a 46 y.o. female here for a routine exam.  Current complaints: no menstrual cycle for over a year.  +hot flashes and flushes.  NO spotting.  + vaginal dryness.     Gynecologic History Patient's last menstrual period was 03/20/2021. Contraception: tubal ligation Last Mammogram: 10/05/21-  Last Pap Smear:  06/17/19- negative Last Colon Screening;  neg cologard with Dr. T 2022 Seat Belts:   Yes Sun Screen:   Yes Dental Check Up:  Yes Brush & Floss:No teeth(dentures)    Obstetric History OB History  Gravida Para Term Preterm AB Living  '4 2 2   2 2  '$ SAB IAB Ectopic Multiple Live Births  2            # Outcome Date GA Lbr Len/2nd Weight Sex Delivery Anes PTL Lv  4 SAB           3 SAB           2 Term      Vag-Spont     1 Term      Vag-Spont        The following portions of the patient's history were reviewed and updated as appropriate: allergies, current medications, past family history, past medical history, past social history, past surgical history, and problem list.  Review of Systems Pertinent items noted in HPI and remainder of comprehensive ROS otherwise negative.    Objective:     Vitals:   08/28/22 1025  BP: 122/89  Pulse: 76  Resp: 16  Weight: 188 lb (85.3 kg)  Height: '5\' 4"'$  (1.626 m)   Vitals:  WNL General appearance: alert, cooperative and no distress  HEENT: Normocephalic, without obvious abnormality, atraumatic Eyes: negative Throat: lips, mucosa, and tongue normal; teeth and gums normal  Respiratory: Clear to auscultation bilaterally  CV: Regular rate and rhythm  Breasts:  Normal appearance, no masses or tenderness, no nipple retraction or dimpling  GI: Soft, non-tender; bowel sounds normal; no masses,  no organomegaly  GU: External Genitalia:  Tanner V, no lesion Urethra:  No prolapse   Vagina: Pink, normal rugae, no blood or discharge  Cervix: No CMT, no lesion  Uterus:  Normal size and contour, non tender, limited by  habitus  Adnexa: Normal, no masses, non tender, limited by habitus  Musculoskeletal: No edema, redness or tenderness in the calves or thighs  Skin: No lesions or rash  Lymphatic: Axillary adenopathy: none     Psychiatric: Normal mood and behavior        Assessment:    Healthy female exam.    Plan:    1.  Pap with cotesting 2. Yearly mammograms 3.  Colon screening up to date 4.  Replens for vaginal dryness 5.  Pt does not want to take meds for hot flashes at this time.

## 2022-08-29 LAB — CYTOLOGY - PAP
Comment: NEGATIVE
Diagnosis: NEGATIVE
High risk HPV: NEGATIVE

## 2022-09-05 ENCOUNTER — Ambulatory Visit: Payer: Self-pay | Admitting: Licensed Clinical Social Worker

## 2022-09-05 NOTE — Patient Outreach (Signed)
  Care Coordination   Initial Visit Note   09/05/2022 Name: Evelyn Sanchez MRN: [id number] DOB: September 05, 1976  Evelyn Sanchez is a 46 y.o. year old female who sees Thekkekandam, Gwen Her, MD for primary care. I spoke with Evelyn Sanchez, daughter of client by phone today and discussed program services for client with Evelyn Sanchez.  What matters to the patients health and wellness today? Client to talk with PCP about program support     Goals Addressed             This Visit's Progress    LCSW spoke with Evelyn Sanchez, daughter of client today and gave Evelyn Sanchez information about program services       Intervention: LCSW spoke with Evelyn Sanchez daughter of client today and gave Evelyn Sanchez information about program services Encouraged client to talk with PCP about program services. Talked with Evelyn Sanchez about RN support with program. Pharmacy support of program and LCSW support in program. Talked with Evelyn Sanchez about client support with PCP Evelyn Sanchez said she would convey program information to client. Client will talk with PCP about program if client is interested in participating in program     SDOH assessments and interventions completed:  Yes     Care Coordination Interventions Activated:  Yes  Care Coordination Interventions:  Yes, provided   Follow up plan: Follow up call scheduled for 09/26/22 at 3:00 PM     Encounter Outcome:  Pt. Visit Completed   Evelyn Sanchez MSW, [place] Holiday representative [facility] Care Management [phone]

## 2022-09-05 NOTE — Patient Instructions (Signed)
Visit Information  Thank you for taking time to visit with me today. Please don't hesitate to contact me if I can be of assistance to you before our next scheduled telephone appointment.  Following are the goals we discussed today:   Our next appointment is by telephone on 09/26/22 at 3:00 PM   Please call the care guide team at [phone] if you need to cancel or reschedule your appointment.   If you are experiencing a Mental Health or [place] or need someone to talk to, please go to [facility] [place] [phone])   Following is a copy of your full plan of care:   Intervention: LCSW spoke with Leory Plowman daughter of client today and gave Audry Riles information about program services Encouraged client to talk with PCP about program services. Talked with Audry Riles about RN support with program. Pharmacy support of program and LCSW support in program. Talked with Audry Riles about client support with PCP Audry Riles said she would convey program information to client. Client will talk with PCP about program if client is interested in participating in program  Ms. Degner was given information about Care Management services by the embedded care coordination team including:  Care Management services include personalized support from designated clinical staff supervised by her physician, including individualized plan of care and coordination with other care providers 24/7 contact phone numbers for assistance for urgent and routine care needs. The patient may stop CCM services at any time (effective at the end of the month) by phone call to the office staff.  Patient agreed to services and verbal consent obtained.    Norva Riffle.Omari Mcmanaway MSW, [place] Holiday representative [facility] Care Management [phone]

## 2022-09-26 ENCOUNTER — Ambulatory Visit: Payer: Self-pay | Admitting: Licensed Clinical Social Worker

## 2022-09-26 NOTE — Patient Outreach (Signed)
  Care Coordination   09/26/2022 Name: Evelyn Sanchez MRN: [id number] DOB: April 08, 1976   Care Coordination Outreach Attempts:  An unsuccessful telephone outreach was attempted today to offer the patient information about available care coordination services as a benefit of their health plan.   Follow Up Plan:  Additional outreach attempts will be made to offer the patient care coordination information and services.   Encounter Outcome:  No Answer   Care Coordination Interventions:  No, not indicated    Norva Riffle.Gottfried Standish MSW, [place] Holiday representative [facility] Care Management [phone]

## 2022-10-15 ENCOUNTER — Other Ambulatory Visit: Payer: Self-pay | Admitting: Sports Medicine

## 2022-10-15 DIAGNOSIS — I10 Essential (primary) hypertension: Secondary | ICD-10-CM

## 2022-11-05 ENCOUNTER — Encounter: Payer: Self-pay | Admitting: Sports Medicine

## 2022-11-06 ENCOUNTER — Ambulatory Visit: Payer: Self-pay | Admitting: Licensed Clinical Social Worker

## 2022-11-06 NOTE — Patient Instructions (Signed)
Visit Information  Thank you for taking time to visit with me today. Please don't hesitate to contact me if I can be of assistance to you before our next scheduled telephone appointment.  Following are the goals we discussed today:   Our next appointment is by telephone on 12/11/22 at 1:00 PM   Please call the care guide team at [phone] if you need to cancel or reschedule your appointment.   If you are experiencing a Mental Health or [place] or need someone to talk to, please go to [facility] [place] [phone])   Following is a copy of your full plan of care:   Interventions Patient and LCSW spoke today about program support. Patient may be interested in program support Client said she is currently having breathing issues. She uses nebulizer as prescribed to help with breathing. She also said she has inhaler she uses to help with breathing . She said she has a history of bronchitis. She has called PCP office today and is awaiting return call from nurse at PCP office.  LCSW spoke with Margaretha Sheffield about RN support and Pharmacy support with Care Coordination program. LCSW encouraged client to continue calling PCP office today until she gets response. Client did have frequent cough during phone call with LCSW. Client will keep trying to reach nurse at her PCP office to ask for care suggestions for client  Ms. Wahler was given information about Care Management services by the embedded care coordination team including:  Care Management services include personalized support from designated clinical staff supervised by her physician, including individualized plan of care and coordination with other care providers 24/7 contact phone numbers for assistance for urgent and routine care needs. The patient may stop CCM services at any time (effective at the end of the month) by phone call to the office staff.  Patient agreed to  services and verbal consent obtained.   Norva Riffle.Sula Fetterly MSW, [place] Holiday representative [facility] Care Management [phone]

## 2022-11-06 NOTE — Patient Outreach (Signed)
  Care Coordination   Follow Up Visit Note   11/06/2022 Name: Evelyn Sanchez MRN: [id number] DOB: 1975-12-19  Zakiya Sporrer is a 47 y.o. year old female who sees Thekkekandam, Gwen Her, MD for primary care. I spoke with  [name] by phone today.  What matters to the patients health and wellness today? Patient may be interested in program support    Goals Addressed             This Visit's Progress    Patient and LCSW spoke today about program support. Patient may be interested in program support.       Interventions Patient and LCSW spoke today about program support. Patient may be interested in program support Client said she is currently having breathing issues. She uses nebulizer as prescribed to help with breathing. She also said she has inhaler she uses to help with breathing . She said she has a history of bronchitis. She has called PCP office today and is awaiting return call from nurse at PCP office.  LCSW spoke with Margaretha Sheffield about RN support and Pharmacy support with Care Coordination program. LCSW encouraged client to continue calling PCP office today until she gets response. Client did have frequent cough during phone call with LCSW. Client will keep trying to reach nurse at her PCP office to ask for care suggestions for client      SDOH assessments and interventions completed:  Yes  SDOH Interventions Today    Flowsheet Row Most Recent Value  SDOH Interventions   Depression Interventions/Treatment  Counseling  Stress Interventions Provide Counseling  [client has stress related to managing medical needs]        Care Coordination Interventions:  Yes, provided   Follow up plan: Follow up call scheduled for 12/11/22 at 1:00 PM     Encounter Outcome:  Pt. Visit Completed   Norva Riffle.Makarios Madlock MSW, [place] Holiday representative [facility] Care Management [phone]

## 2022-11-08 DIAGNOSIS — G894 Chronic pain syndrome: Secondary | ICD-10-CM | POA: Diagnosis not present

## 2022-11-08 DIAGNOSIS — M5030 Other cervical disc degeneration, unspecified cervical region: Secondary | ICD-10-CM | POA: Diagnosis not present

## 2022-11-08 DIAGNOSIS — G8929 Other chronic pain: Secondary | ICD-10-CM | POA: Diagnosis not present

## 2022-11-08 DIAGNOSIS — Z79891 Long term (current) use of opiate analgesic: Secondary | ICD-10-CM | POA: Diagnosis not present

## 2022-11-08 DIAGNOSIS — M5136 Other intervertebral disc degeneration, lumbar region: Secondary | ICD-10-CM | POA: Diagnosis not present

## 2022-11-08 DIAGNOSIS — M25562 Pain in left knee: Secondary | ICD-10-CM | POA: Diagnosis not present

## 2022-11-16 DIAGNOSIS — I10 Essential (primary) hypertension: Secondary | ICD-10-CM | POA: Diagnosis not present

## 2022-11-16 DIAGNOSIS — A498 Other bacterial infections of unspecified site: Secondary | ICD-10-CM | POA: Diagnosis not present

## 2022-11-16 DIAGNOSIS — T8454XD Infection and inflammatory reaction due to internal left knee prosthesis, subsequent encounter: Secondary | ICD-10-CM | POA: Diagnosis not present

## 2022-11-16 DIAGNOSIS — Z969 Presence of functional implant, unspecified: Secondary | ICD-10-CM | POA: Diagnosis not present

## 2022-11-16 DIAGNOSIS — Z792 Long term (current) use of antibiotics: Secondary | ICD-10-CM | POA: Diagnosis not present

## 2022-11-16 DIAGNOSIS — Z96652 Presence of left artificial knee joint: Secondary | ICD-10-CM | POA: Diagnosis not present

## 2022-11-16 DIAGNOSIS — E785 Hyperlipidemia, unspecified: Secondary | ICD-10-CM | POA: Diagnosis not present

## 2022-11-16 DIAGNOSIS — G43909 Migraine, unspecified, not intractable, without status migrainosus: Secondary | ICD-10-CM | POA: Diagnosis not present

## 2022-12-11 ENCOUNTER — Ambulatory Visit: Payer: Self-pay | Admitting: Licensed Clinical Social Worker

## 2022-12-11 NOTE — Patient Outreach (Signed)
  Care Coordination   Follow Up Visit Note   12/11/2022 Name: Evelyn Sanchez MRN: [id number] DOB: [birth year]  Evelyn Sanchez is a 47 y.o. year old female who sees Thekkekandam, Gwen Her, MD for primary care. I spoke with Prudy Feeler, spouse of client, via phone today about client needs. .  What matters to the patients health and wellness today?   LCSW spoke via phone today with Prudy Feeler, spouse of client, about client needs. Rosaria Ferries said client has knee pain issue    Goals Addressed             This Visit's Progress    LCSW spoke via phone today with Prudy Feeler, spouse of client, about client needs. Rosaria Ferries said client has knee pain issue       Intervention:   LCSW spoke via phone today with Prudy Feeler, spouse of client, about client needs. Rosaria Ferries said client has knee pain issue. Rosaria Ferries said client is scheduled for medical appointment with medical provider to assess her knee care issue Rosaria Ferries and LCSW discussed program support services with RN, LCSW and Pharmacist LCSW spoke with Prudy Feeler about client support with PCP Reviewed client medication procurement. Reviewed client appetite. Client has no transport needs Encouraged client to discuss Care Coordination Program support with PCP. Encouraged client to call LCSW as needed for SW support at [phone].      SDOH assessments and interventions completed:  Yes  SDOH Interventions Today    Flowsheet Row Most Recent Value  SDOH Interventions   Depression Interventions/Treatment  Medication  Physical Activity Interventions Other (Comments)  [client may have some walking challenges. She has knee pain issue]  Stress Interventions Other (Comment)  [client has stress related to managing medical needs]        Care Coordination Interventions:  Yes, provided   Interventions Today    Flowsheet Row Most Recent Value  Chronic Disease   Chronic disease during today's visit Other  [discussed program support involving RN, LCSW and Pharmacist]   General Interventions   General Interventions Discussed/Reviewed General Interventions Discussed  [reviewed program support]  Exercise Interventions   Exercise Discussed/Reviewed Physical Activity  Prudy Feeler, spouse of client, said client has knee pain issue]  Education Interventions   Education Provided Provided Education  Provided Verbal Education On [employer]        Follow up plan: Follow up call scheduled for 01/15/23 at 1:00 PM     Encounter Outcome:  Pt. Visit Completed   Norva Riffle.Darrnell Mangiaracina MSW, [place] Holiday representative [facility] Care Management [phone]

## 2022-12-11 NOTE — Patient Instructions (Signed)
Visit Information  Thank you for taking time to visit with me today. Please don't hesitate to contact me if I can be of assistance to you before our next scheduled telephone appointment.  Following are the goals we discussed today:    Our next appointment is by telephone on 01/15/23 at 1:00 PM   Please call the care guide team at [phone] if you need to cancel or reschedule your appointment.   If you are experiencing a Mental Health or [place] or need someone to talk to, please go to [facility] [place] [phone])   Following is a copy of your full plan of care:   Intervention:   LCSW spoke via phone today with Evelyn Sanchez, spouse of client, about client needs. Evelyn Sanchez said client has knee pain issue. Evelyn Sanchez said client is scheduled for medical appointment with medical provider to assess her knee care issue Evelyn Sanchez and LCSW discussed program support services with RN, LCSW and Pharmacist LCSW spoke with Evelyn Sanchez about client support with PCP Reviewed client medication procurement. Reviewed client appetite. Client has no transport needs Encouraged client to discuss Care Coordination Program support with PCP. Encouraged client to call LCSW as needed for SW support at [phone].   Evelyn Sanchez was given information about Care Management services by the embedded care coordination team including:  Care Management services include personalized support from designated clinical staff supervised by her physician, including individualized plan of care and coordination with other care providers 24/7 contact phone numbers for assistance for urgent and routine care needs. The patient may stop CCM services at any time (effective at the end of the month) by phone call to the office staff.  Patient agreed to services and verbal consent obtained.   Norva Riffle.Daundre Biel MSW, [place] Holiday representative [facility] Care  Management [phone]

## 2022-12-19 DIAGNOSIS — Z96652 Presence of left artificial knee joint: Secondary | ICD-10-CM | POA: Diagnosis not present

## 2022-12-19 DIAGNOSIS — Z96659 Presence of unspecified artificial knee joint: Secondary | ICD-10-CM | POA: Diagnosis not present

## 2022-12-19 DIAGNOSIS — T84018D Broken internal joint prosthesis, other site, subsequent encounter: Secondary | ICD-10-CM | POA: Diagnosis not present

## 2022-12-19 DIAGNOSIS — T8450XD Infection and inflammatory reaction due to unspecified internal joint prosthesis, subsequent encounter: Secondary | ICD-10-CM | POA: Diagnosis not present

## 2022-12-26 DIAGNOSIS — T8450XD Infection and inflammatory reaction due to unspecified internal joint prosthesis, subsequent encounter: Secondary | ICD-10-CM | POA: Diagnosis not present

## 2022-12-29 DIAGNOSIS — Z7409 Other reduced mobility: Secondary | ICD-10-CM | POA: Diagnosis not present

## 2022-12-29 DIAGNOSIS — Z96652 Presence of left artificial knee joint: Secondary | ICD-10-CM | POA: Diagnosis not present

## 2022-12-29 DIAGNOSIS — M25562 Pain in left knee: Secondary | ICD-10-CM | POA: Diagnosis not present

## 2022-12-29 DIAGNOSIS — T84018D Broken internal joint prosthesis, other site, subsequent encounter: Secondary | ICD-10-CM | POA: Diagnosis not present

## 2022-12-29 DIAGNOSIS — M25662 Stiffness of left knee, not elsewhere classified: Secondary | ICD-10-CM | POA: Diagnosis not present

## 2022-12-29 DIAGNOSIS — Z4789 Encounter for other orthopedic aftercare: Secondary | ICD-10-CM | POA: Diagnosis not present

## 2022-12-29 DIAGNOSIS — R29898 Other symptoms and signs involving the musculoskeletal system: Secondary | ICD-10-CM | POA: Diagnosis not present

## 2023-01-04 DIAGNOSIS — M25562 Pain in left knee: Secondary | ICD-10-CM | POA: Diagnosis not present

## 2023-01-04 DIAGNOSIS — D62 Acute posthemorrhagic anemia: Secondary | ICD-10-CM | POA: Diagnosis not present

## 2023-01-04 DIAGNOSIS — Z96651 Presence of right artificial knee joint: Secondary | ICD-10-CM | POA: Diagnosis not present

## 2023-01-04 DIAGNOSIS — M2352 Chronic instability of knee, left knee: Secondary | ICD-10-CM | POA: Diagnosis not present

## 2023-01-04 DIAGNOSIS — G8929 Other chronic pain: Secondary | ICD-10-CM | POA: Diagnosis not present

## 2023-01-04 DIAGNOSIS — G8918 Other acute postprocedural pain: Secondary | ICD-10-CM | POA: Diagnosis not present

## 2023-01-04 DIAGNOSIS — T84013A Broken internal left knee prosthesis, initial encounter: Secondary | ICD-10-CM | POA: Diagnosis not present

## 2023-01-04 DIAGNOSIS — T8454XD Infection and inflammatory reaction due to internal left knee prosthesis, subsequent encounter: Secondary | ICD-10-CM | POA: Diagnosis not present

## 2023-01-04 DIAGNOSIS — I10 Essential (primary) hypertension: Secondary | ICD-10-CM | POA: Diagnosis not present

## 2023-01-04 DIAGNOSIS — Z471 Aftercare following joint replacement surgery: Secondary | ICD-10-CM | POA: Diagnosis not present

## 2023-01-04 DIAGNOSIS — T84023A Instability of internal left knee prosthesis, initial encounter: Secondary | ICD-10-CM | POA: Diagnosis not present

## 2023-01-04 DIAGNOSIS — G894 Chronic pain syndrome: Secondary | ICD-10-CM | POA: Diagnosis not present

## 2023-01-04 DIAGNOSIS — Z96652 Presence of left artificial knee joint: Secondary | ICD-10-CM | POA: Diagnosis not present

## 2023-01-15 ENCOUNTER — Ambulatory Visit: Payer: Self-pay | Admitting: Licensed Clinical Social Worker

## 2023-01-15 DIAGNOSIS — T8454XD Infection and inflammatory reaction due to internal left knee prosthesis, subsequent encounter: Secondary | ICD-10-CM | POA: Diagnosis not present

## 2023-01-15 DIAGNOSIS — M79662 Pain in left lower leg: Secondary | ICD-10-CM | POA: Diagnosis not present

## 2023-01-15 DIAGNOSIS — B957 Other staphylococcus as the cause of diseases classified elsewhere: Secondary | ICD-10-CM | POA: Diagnosis not present

## 2023-01-15 DIAGNOSIS — Z792 Long term (current) use of antibiotics: Secondary | ICD-10-CM | POA: Diagnosis not present

## 2023-01-15 NOTE — Patient Outreach (Signed)
  Care Coordination   Follow Up Visit Note   01/15/2023 Name: Evelyn Sanchez MRN: [id number] DOB: [birth year]  Evelyn Sanchez is a 47 y.o. year old female who sees Thekkekandam, Gwen Her, MD for primary care. I spoke with  [name] / Larinda Buttery, son of client ,via phone today about client needs.   What matters to the patients health and wellness today?  LCSW spoke via phone today with Vickey Sages, Brooke Bonito, son of client, about client current status and needs    Goals Addressed             This Visit's Progress    LCSW spoke via phone today with Larinda Buttery, son of client, about client needs.       Interventions: LCSW spoke via phone today with Larinda Buttery, son of client, about client needs Discussed recent client procedure on her knee. Son of client said client did have recent knee procedure. Family support for client is from her spouse, from her son, and from her daughter.  LCSW has previously spoken with spouse, son and daughter about Care Coordination program support for Evelyn Lenahan LCSW has left message for client requesting call back.  Son of client said he would convey information to client today and see if she could return call soon to LCSW to discuss her current needs and status LCSW thanked Vickey Sages, Brooke Bonito, for phone call with LCSW today     SDOH assessments and interventions completed:  Yes  SDOH Interventions Today    Flowsheet Row Most Recent Value  SDOH Interventions   Depression Interventions/Treatment  Currently on Treatment  Physical Activity Interventions Other (Comments)  [uses a walker to help her walk]  Stress Interventions Other (Comment)  [stress related to managing medical needs]        Care Coordination Interventions:  Yes, provided   Interventions Today    Flowsheet Row Most Recent Value  Chronic Disease   Chronic disease during today's visit Other  [spoke with Larinda Buttery, son of client, via phone today about client  needs]  General Interventions   General Interventions Discussed/Reviewed General Interventions Discussed, [employer]  [discussed program support for client]  Education Interventions   Education Provided Provided Education  Provided Verbal Education On [employer] program support with RN, LCSW ,Pharmacist]        Follow up plan: Follow up call scheduled for 02/27/23 at 2:00 PM     Encounter Outcome:  Pt. Visit Completed   Norva Riffle.Valory Wetherby MSW, [place] Holiday representative [facility] Care Management [phone]

## 2023-01-15 NOTE — Patient Instructions (Signed)
Visit Information  Thank you for taking time to visit with me today. Please don't hesitate to contact me if I can be of assistance to you.   Following are the goals we discussed today:   Goals Addressed             This Visit's Progress    LCSW spoke via phone today with Evelyn Sanchez, son of client, about client needs.       Interventions: LCSW spoke via phone today with Evelyn Sanchez, son of client, about client needs Discussed recent client procedure on her knee. Son of client said client did have recent knee procedure. Family support for client is from her spouse, from her son, and from her daughter.  LCSW has previously spoken with spouse, son and daughter about Care Coordination program support for [name] LCSW has left message for client requesting call back.  Son of client said he would convey information to client today and see if she could return call soon to LCSW to discuss her current needs and status LCSW thanked [place], for phone call with LCSW today     Our next appointment is by telephone on 02/27/23 at 2:00 PM   Please call the care guide team at [phone] if you need to cancel or reschedule your appointment.   If you are experiencing a Mental Health or [place] or need someone to talk to, please go to [facility] [place] [phone])   The patient / son of patient, verbalized understanding of instructions, educational materials, and care plan provided today and DECLINED offer to receive copy of patient instructions, educational materials, and care plan.   The patient /son of patient, has been provided with contact information for the care management team and has been advised to call with any health related questions or concerns.    Norva Riffle.Cowen Pesqueira MSW, [place] Holiday representative [facility] Care Management [phone]

## 2023-01-16 ENCOUNTER — Ambulatory Visit: Payer: Self-pay | Admitting: Licensed Clinical Social Worker

## 2023-01-16 NOTE — Patient Instructions (Signed)
Visit Information  Thank you for taking time to visit with me today. Please don't hesitate to contact me if I can be of assistance to you.   Following are the goals we discussed today:   Goals Addressed               This Visit's Progress     patient had knee procedure on January 04, 2023. She is trying to start physical therapy sessions. (Outpatient). She is dealing with knee pain issues (pt-stated)        Interventions:  LCSW spoke via phone with patient today about patient needs. She said she had knee procedure on January 04, 2023. She has been having fluid buildup on knee recently. She is scheduled for appointment this Thursday for knee exam. She said medical provider may have to pull fluid from her knee.  She said knee is swollen and she has had more difficulty wlaking last few days.  She plans to go to appointment this Thursday for treatment to swollen knee. Discussed medication procurement. Discussed sleep issues. She said she has had some sleep difficulty Discussed transport needs. Her family provides transport support for client.  Discussed family support with spouse, son and daughter. Discussed program support with RN, LCSW and Pharmacist Encouraged client to call LCSW for SW support as needed at [phone].     Our next appointment is by telephone on 01/30/23 at 10:30 AM   Please call the care guide team at [phone] if you need to cancel or reschedule your appointment.   If you are experiencing a Mental Health or [place] or need someone to talk to, please go to [facility] [place] [phone])   The patient verbalized understanding of instructions, educational materials, and care plan provided today and DECLINED offer to receive copy of patient instructions, educational materials, and care plan.   The patient has been provided with contact information for the care management team and has been  advised to call with any health related questions or concerns.   Norva Riffle.Lachele Lievanos MSW, [place] Holiday representative [facility] Care Management [phone]

## 2023-01-16 NOTE — Patient Outreach (Signed)
  Care Coordination   Follow Up Visit Note   01/16/2023 Name: Evelyn Sanchez MRN: [id number] DOB: 11-03-1975  Evelyn Sanchez is a 47 y.o. year old female who sees Thekkekandam, Gwen Her, MD for primary care. I spoke with  Evelyn Sanchez by phone today.  What matters to the patients health and wellness today? Patient preparing to start physical therapy sessions She is dealing with knee pain issues.     Goals Addressed               This Visit's Progress     patient had knee procedure on January 04, 2023. She is trying to start physical therapy sessions. (Outpatient). She is dealing with knee pain issues (pt-stated)        Interventions:  LCSW spoke via phone with patient today about patient needs. She said she had knee procedure on January 04, 2023. She has been having fluid buildup on knee recently. She is scheduled for appointment this Thursday for knee exam. She said medical provider may have to pull fluid from her knee.  She said knee is swollen and she has had more difficulty wlaking last few days.  She plans to go to appointment this Thursday for treatment to swollen knee. Discussed medication procurement. Discussed sleep issues. She said she has had some sleep difficulty Discussed transport needs. Her family provides transport support for client.  Discussed family support with spouse, son and daughter. Discussed program support with RN, LCSW and Pharmacist Encouraged client to call LCSW for SW support as needed at [phone].      SDOH assessments and interventions completed:  Yes  SDOH Interventions Today    Flowsheet Row Most Recent Value  SDOH Interventions   Depression Interventions/Treatment  Counseling  Physical Activity Interventions Other (Comments)  [uses a walker to help her walk]  Stress Interventions Provide Counseling  [has stress in managing medical needs]        Care Coordination Interventions:  Yes, provided   Interventions Today    Flowsheet Row Most Recent  Value  Chronic Disease   Chronic disease during today's visit Other  [spoke with client about her current needs]  General Interventions   General Interventions Discussed/Reviewed General Interventions Discussed, Community Resources  [discussed program support services]  Exercise Interventions   Exercise Discussed/Reviewed Physical Activity  [client said she will start physical therapy Outpatient sessions tomorrow. she uses a walker to help her walk]  Education Interventions   Education Provided Provided Education  Provided Verbal Education On [employer], Mental Health/Coping with Illness  [discussed coping with current medical needs]  Nutrition Interventions   Nutrition Discussed/Reviewed Nutrition Discussed  Safety Interventions   Safety Discussed/Reviewed Fall Risk        Follow up plan: Follow up call scheduled for 01/30/23 at 10:30 AM    Encounter Outcome:  Pt. Visit Completed {THN Tip this will not be part of the note when signed-REQUIRED REPORT FIELD DO NOT DELETE (Optional):[id number]  Evelyn Sanchez.Cimberly Stoffel MSW, [employer] representative [facility] Care Management [phone]

## 2023-01-17 DIAGNOSIS — Z4789 Encounter for other orthopedic aftercare: Secondary | ICD-10-CM | POA: Diagnosis not present

## 2023-01-17 DIAGNOSIS — Z96652 Presence of left artificial knee joint: Secondary | ICD-10-CM | POA: Diagnosis not present

## 2023-01-17 DIAGNOSIS — T84018D Broken internal joint prosthesis, other site, subsequent encounter: Secondary | ICD-10-CM | POA: Diagnosis not present

## 2023-01-17 DIAGNOSIS — Z7409 Other reduced mobility: Secondary | ICD-10-CM | POA: Diagnosis not present

## 2023-01-17 DIAGNOSIS — M25662 Stiffness of left knee, not elsewhere classified: Secondary | ICD-10-CM | POA: Diagnosis not present

## 2023-01-17 DIAGNOSIS — M25562 Pain in left knee: Secondary | ICD-10-CM | POA: Diagnosis not present

## 2023-01-17 DIAGNOSIS — Z96659 Presence of unspecified artificial knee joint: Secondary | ICD-10-CM | POA: Diagnosis not present

## 2023-01-17 DIAGNOSIS — R29898 Other symptoms and signs involving the musculoskeletal system: Secondary | ICD-10-CM | POA: Diagnosis not present

## 2023-01-17 DIAGNOSIS — M6281 Muscle weakness (generalized): Secondary | ICD-10-CM | POA: Diagnosis not present

## 2023-01-17 DIAGNOSIS — G8929 Other chronic pain: Secondary | ICD-10-CM | POA: Diagnosis not present

## 2023-01-18 DIAGNOSIS — Z96652 Presence of left artificial knee joint: Secondary | ICD-10-CM | POA: Diagnosis not present

## 2023-01-18 DIAGNOSIS — Z471 Aftercare following joint replacement surgery: Secondary | ICD-10-CM | POA: Diagnosis not present

## 2023-01-19 DIAGNOSIS — Z96659 Presence of unspecified artificial knee joint: Secondary | ICD-10-CM | POA: Diagnosis not present

## 2023-01-19 DIAGNOSIS — R29898 Other symptoms and signs involving the musculoskeletal system: Secondary | ICD-10-CM | POA: Diagnosis not present

## 2023-01-19 DIAGNOSIS — Z4789 Encounter for other orthopedic aftercare: Secondary | ICD-10-CM | POA: Diagnosis not present

## 2023-01-19 DIAGNOSIS — M6281 Muscle weakness (generalized): Secondary | ICD-10-CM | POA: Diagnosis not present

## 2023-01-19 DIAGNOSIS — G8929 Other chronic pain: Secondary | ICD-10-CM | POA: Diagnosis not present

## 2023-01-19 DIAGNOSIS — M25562 Pain in left knee: Secondary | ICD-10-CM | POA: Diagnosis not present

## 2023-01-19 DIAGNOSIS — Z7409 Other reduced mobility: Secondary | ICD-10-CM | POA: Diagnosis not present

## 2023-01-19 DIAGNOSIS — Z96652 Presence of left artificial knee joint: Secondary | ICD-10-CM | POA: Diagnosis not present

## 2023-01-19 DIAGNOSIS — M25662 Stiffness of left knee, not elsewhere classified: Secondary | ICD-10-CM | POA: Diagnosis not present

## 2023-01-19 DIAGNOSIS — T84018D Broken internal joint prosthesis, other site, subsequent encounter: Secondary | ICD-10-CM | POA: Diagnosis not present

## 2023-01-20 ENCOUNTER — Other Ambulatory Visit: Payer: Self-pay | Admitting: Sports Medicine

## 2023-01-20 DIAGNOSIS — I10 Essential (primary) hypertension: Secondary | ICD-10-CM

## 2023-01-24 DIAGNOSIS — M25562 Pain in left knee: Secondary | ICD-10-CM | POA: Diagnosis not present

## 2023-01-24 DIAGNOSIS — Z7409 Other reduced mobility: Secondary | ICD-10-CM | POA: Diagnosis not present

## 2023-01-24 DIAGNOSIS — M25662 Stiffness of left knee, not elsewhere classified: Secondary | ICD-10-CM | POA: Diagnosis not present

## 2023-01-24 DIAGNOSIS — M6281 Muscle weakness (generalized): Secondary | ICD-10-CM | POA: Diagnosis not present

## 2023-01-24 DIAGNOSIS — T84018D Broken internal joint prosthesis, other site, subsequent encounter: Secondary | ICD-10-CM | POA: Diagnosis not present

## 2023-01-24 DIAGNOSIS — Z4789 Encounter for other orthopedic aftercare: Secondary | ICD-10-CM | POA: Diagnosis not present

## 2023-01-24 DIAGNOSIS — R29898 Other symptoms and signs involving the musculoskeletal system: Secondary | ICD-10-CM | POA: Diagnosis not present

## 2023-01-24 DIAGNOSIS — Z96659 Presence of unspecified artificial knee joint: Secondary | ICD-10-CM | POA: Diagnosis not present

## 2023-01-24 DIAGNOSIS — Z96652 Presence of left artificial knee joint: Secondary | ICD-10-CM | POA: Diagnosis not present

## 2023-01-24 DIAGNOSIS — G8929 Other chronic pain: Secondary | ICD-10-CM | POA: Diagnosis not present

## 2023-01-30 ENCOUNTER — Ambulatory Visit (INDEPENDENT_AMBULATORY_CARE_PROVIDER_SITE_OTHER): Payer: 59 | Admitting: Sports Medicine

## 2023-01-30 ENCOUNTER — Other Ambulatory Visit (INDEPENDENT_AMBULATORY_CARE_PROVIDER_SITE_OTHER): Payer: 59

## 2023-01-30 ENCOUNTER — Ambulatory Visit: Payer: Self-pay | Admitting: Licensed Clinical Social Worker

## 2023-01-30 DIAGNOSIS — Z96652 Presence of left artificial knee joint: Secondary | ICD-10-CM

## 2023-01-30 DIAGNOSIS — Z8739 Personal history of other diseases of the musculoskeletal system and connective tissue: Secondary | ICD-10-CM

## 2023-01-30 MED ORDER — PREGABALIN 75 MG PO CAPS
ORAL_CAPSULE | ORAL | 3 refills | Status: DC
Start: 2023-01-30 — End: 2024-08-19

## 2023-01-30 NOTE — Assessment & Plan Note (Signed)
Evelyn Sanchez returns, she has had increasing sensations of burning in her legs and feet particularly when walking, color change in her toes. She has had several venous ultrasounds which have been for the most part unrevealing, she has not had ABIs. She did have a nerve conduction/EMG that was negative. I reviewed her recent lumbar spine MRI, there are no discrete areas of neuroforaminal stenosis. Sensation is grossly intact. To complete the workup we will proceed with ABIs, I will also add Lyrica as she did not tolerate gabapentin in the past. She does understand that with a negative nerve conduction and EMG there is unlikely neuropathic type disease.

## 2023-01-30 NOTE — Patient Instructions (Signed)
Visit Information  Thank you for taking time to visit with me today. Please don't hesitate to contact me if I can be of assistance to you.   Following are the goals we discussed today:   Goals Addressed             This Visit's Progress    Patient Stated she has some knee pain. she is scheduled to have physical therapy session tomorrow       Interventions: Discussed client needs with client. She is going to appointment today with PCP Discussed knee pain issues. She is scheduled to have physical therapy session tomorrow Discussed recovery from knee surgery. She had knee surgery on January 04, 2023. Discussed family support. Has support from her spouse, [name]. Has support from her children Discussed program support Encouraged client to call LCSW as needed at [phone]     Our next appointment is by telephone on 03/13/23 at 9:30 AM   Please call the care guide team at [phone] if you need to cancel or reschedule your appointment.   If you are experiencing a Mental Health or Behavioral Health Crisis or need someone to talk to, please go to [facility] [address] [place] [phone])   The patient verbalized understanding of instructions, educational materials, and care plan provided today and DECLINED offer to receive copy of patient instructions, educational materials, and care plan.   The patient has been provided with contact information for the care management team and has been advised to call with any health related questions or concerns.   Kelton Pillar.Rosell Khouri MSW, LCSW Licensed Visual merchandiser [facility] Care Management [phone]

## 2023-01-30 NOTE — Patient Outreach (Signed)
  Care Coordination   Follow Up Visit Note   01/30/2023 Name: Evelyn Sanchez MRN: [id number] DOB: 1976/10/15  Evelyn Sanchez is a 47 y.o. year old female who sees Thekkekandam, Ihor Austin, MD for primary care. I spoke with  Evelyn Sanchez by phone today.  What matters to the patients health and wellness today? Patient has some knee pain. She is scheduled to have physical  therapy session tomorrow    Goals Addressed             This Visit's Progress    Patient Stated she has some knee pain. she is scheduled to have physical therapy session tomorrow       Interventions: Discussed client needs with client. She is going to appointment today with PCP Discussed knee pain issues. She is scheduled to have physical therapy session tomorrow Discussed recovery from knee surgery. She had knee surgery on January 04, 2023. Discussed family support. Has support from her spouse, Evelyn Sanchez. Has support from her children Discussed program support Encouraged client to call LCSW as needed at [phone]     SDOH assessments and interventions completed:  Yes  SDOH Interventions Today    Flowsheet Row Most Recent Value  SDOH Interventions   Depression Interventions/Treatment  Counseling  Physical Activity Interventions Other (Comments)  [walking challenges]  Stress Interventions Provide Counseling  [stress in managing medical needs]        Care Coordination Interventions:  Yes, provided   Interventions Today    Flowsheet Row Most Recent Value  Chronic Disease   Chronic disease during today's visit Other  [spoke with client about knee issues and pain issues]  General Interventions   General Interventions Discussed/Reviewed General Interventions Discussed, Community Resources  [discussed physical therapy sessions received by client]  Exercise Interventions   Exercise Discussed/Reviewed Physical Activity  [knee pain issues]  Physical Activity Discussed/Reviewed Physical Activity Discussed  Education  Interventions   Education Provided Provided Education  Provided Verbal Education On [employer]  [discussed program support]  Mental Health Interventions   Mental Health Discussed/Reviewed Anxiety, Coping Strategies  [discussed coping with medical needs]  Safety Interventions   Safety Discussed/Reviewed Fall Risk, Home Safety        Follow up plan: Follow up call scheduled for 03/13/23 at 9:30 AM    Encounter Outcome:  Pt. Visit Completed   Evelyn Sanchez MSW, LCSW Licensed Visual merchandiser [facility] Care Management [phone]

## 2023-01-30 NOTE — Progress Notes (Signed)
    Procedures performed today:    Procedure: Real-time Ultrasound Guided aspiration of the left knee incisional seroma Device: Samsung HS60  Verbal informed consent obtained.  Time-out conducted.  Noted no overlying erythema, induration, or other signs of local infection.  Skin prepped in a sterile fashion.  Local anesthesia: Topical Ethyl chloride.  With sterile technique and under real time ultrasound guidance: Using 18-gauge needle aspirated approximately 20 mL of serosanguineous fluid, this was sent off for crystal analysis, cell counts and cultures.   Completed without difficulty  Advised to call if fevers/chills, erythema, induration, drainage, or persistent bleeding.  Images permanently stored and available for review in PACS.  Impression: Technically successful ultrasound guided aspiration.  Procedure: Real-time Ultrasound Guided therapeutic aspiration of left knee joint Device: Samsung HS60  Verbal informed consent obtained.  Time-out conducted.  Noted no overlying erythema, induration, or other signs of local infection.  Skin prepped in a sterile fashion.  Local anesthesia: Topical Ethyl chloride.  With sterile technique and under real time ultrasound guidance: Noted effusion, patient complaining of significant pain, using strict sterile technique I advanced an 18-gauge needle into the suprapatellar recess and aspirated approximately 40 mL of clear, straw-colored fluid. Completed without difficulty  Advised to call if fevers/chills, erythema, induration, drainage, or persistent bleeding.  Images permanently stored and available for review in PACS.  Impression: Technically successful ultrasound guided therapeutic arthrocentesis.  Independent interpretation of notes and tests performed by another provider:   None.  Brief History, Exam, Impression, and Recommendations:    Burning sensation both legs and feet Evelyn Sanchez returns, she has had increasing sensations of burning in  her legs and feet particularly when walking, color change in her toes. She has had several venous ultrasounds which have been for the most part unrevealing, she has not had ABIs. She did have a nerve conduction/EMG that was negative. I reviewed her recent lumbar spine MRI, there are no discrete areas of neuroforaminal stenosis. Sensation is grossly intact. To complete the workup we will proceed with ABIs, I will also add Lyrica as she did not tolerate gabapentin in the past. She does understand that with a negative nerve conduction and EMG there is unlikely neuropathic type disease.  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 She has developed what appears to be an incisional seroma after her operation back in March. Today we aspirated the seroma under strict sterile technique, serosanguineous fluid aspirated, this will be sent off for cell count and cultures. She also had significant joint effusion, this was aspirated under strict sterile technique as well.  Clear, straw-colored fluid was encountered.    ____________________________________________ Ihor Austin. Benjamin Stain, M.D., ABFM., CAQSM., AME. Primary Care and Sports Medicine [facility]  Adjunct Professor of Family Medicine  [place] [employer] [facility] [employer] [facility] [employer]  Restaurant manager, fast food

## 2023-01-30 NOTE — Assessment & Plan Note (Addendum)
Evelyn Sanchez is status post revision arthroplasty left knee secondary to infection, she is currently on antibiotics. She has developed what appears to be an incisional seroma after her operation back in March. Today we aspirated the seroma under strict sterile technique, serosanguineous fluid aspirated, this will be sent off for cell count and cultures. She also had significant joint effusion, this was aspirated under strict sterile technique as well.  Clear, straw-colored fluid was encountered.

## 2023-01-31 ENCOUNTER — Encounter: Payer: Self-pay | Admitting: Sports Medicine

## 2023-01-31 DIAGNOSIS — R29898 Other symptoms and signs involving the musculoskeletal system: Secondary | ICD-10-CM | POA: Diagnosis not present

## 2023-01-31 DIAGNOSIS — M25562 Pain in left knee: Secondary | ICD-10-CM | POA: Diagnosis not present

## 2023-01-31 DIAGNOSIS — Z7409 Other reduced mobility: Secondary | ICD-10-CM | POA: Diagnosis not present

## 2023-01-31 DIAGNOSIS — Z4789 Encounter for other orthopedic aftercare: Secondary | ICD-10-CM | POA: Diagnosis not present

## 2023-01-31 DIAGNOSIS — Z96659 Presence of unspecified artificial knee joint: Secondary | ICD-10-CM | POA: Diagnosis not present

## 2023-01-31 DIAGNOSIS — M6281 Muscle weakness (generalized): Secondary | ICD-10-CM | POA: Diagnosis not present

## 2023-01-31 DIAGNOSIS — T84018D Broken internal joint prosthesis, other site, subsequent encounter: Secondary | ICD-10-CM | POA: Diagnosis not present

## 2023-01-31 DIAGNOSIS — G8929 Other chronic pain: Secondary | ICD-10-CM | POA: Diagnosis not present

## 2023-01-31 DIAGNOSIS — M25662 Stiffness of left knee, not elsewhere classified: Secondary | ICD-10-CM | POA: Diagnosis not present

## 2023-01-31 DIAGNOSIS — Z96652 Presence of left artificial knee joint: Secondary | ICD-10-CM | POA: Diagnosis not present

## 2023-02-01 LAB — ANAEROBIC AND AEROBIC CULTURE: MICRO NUMBER:: 14831180

## 2023-02-03 LAB — ANAEROBIC AND AEROBIC CULTURE: MICRO NUMBER:: 14831179

## 2023-02-05 LAB — SYNOVIAL FLUID ANALYSIS, COMPLETE
Basophils, %: 0 %
Eosinophils-Synovial: 0 % (ref 0–2)
Lymphocytes-Synovial Fld: 68 % (ref 0–74)
Monocyte/Macrophage: 12 % (ref 0–69)
Neutrophil, Synovial: 19 % (ref 0–24)
Synoviocytes, %: 1 % (ref 0–15)
WBC, Synovial: 338 {cells}/uL — ABNORMAL HIGH (ref <150)

## 2023-02-05 LAB — ANAEROBIC AND AEROBIC CULTURE
AER RESULT:: NO GROWTH
MICRO NUMBER:: 14831180
SPECIMEN QUALITY:: ADEQUATE
SPECIMEN QUALITY:: ADEQUATE

## 2023-02-06 DIAGNOSIS — T84018D Broken internal joint prosthesis, other site, subsequent encounter: Secondary | ICD-10-CM | POA: Diagnosis not present

## 2023-02-06 DIAGNOSIS — Z7409 Other reduced mobility: Secondary | ICD-10-CM | POA: Diagnosis not present

## 2023-02-06 DIAGNOSIS — Z4789 Encounter for other orthopedic aftercare: Secondary | ICD-10-CM | POA: Diagnosis not present

## 2023-02-06 DIAGNOSIS — R29898 Other symptoms and signs involving the musculoskeletal system: Secondary | ICD-10-CM | POA: Diagnosis not present

## 2023-02-06 DIAGNOSIS — Z96652 Presence of left artificial knee joint: Secondary | ICD-10-CM | POA: Diagnosis not present

## 2023-02-06 DIAGNOSIS — M6281 Muscle weakness (generalized): Secondary | ICD-10-CM | POA: Diagnosis not present

## 2023-02-06 DIAGNOSIS — G8929 Other chronic pain: Secondary | ICD-10-CM | POA: Diagnosis not present

## 2023-02-06 DIAGNOSIS — M25562 Pain in left knee: Secondary | ICD-10-CM | POA: Diagnosis not present

## 2023-02-06 DIAGNOSIS — M25662 Stiffness of left knee, not elsewhere classified: Secondary | ICD-10-CM | POA: Diagnosis not present

## 2023-02-06 DIAGNOSIS — Z96659 Presence of unspecified artificial knee joint: Secondary | ICD-10-CM | POA: Diagnosis not present

## 2023-02-07 DIAGNOSIS — G894 Chronic pain syndrome: Secondary | ICD-10-CM | POA: Diagnosis not present

## 2023-02-07 DIAGNOSIS — M170 Bilateral primary osteoarthritis of knee: Secondary | ICD-10-CM | POA: Diagnosis not present

## 2023-02-07 DIAGNOSIS — M5030 Other cervical disc degeneration, unspecified cervical region: Secondary | ICD-10-CM | POA: Diagnosis not present

## 2023-02-07 DIAGNOSIS — M4807 Spinal stenosis, lumbosacral region: Secondary | ICD-10-CM | POA: Diagnosis not present

## 2023-02-07 DIAGNOSIS — Z79891 Long term (current) use of opiate analgesic: Secondary | ICD-10-CM | POA: Diagnosis not present

## 2023-02-07 DIAGNOSIS — M5416 Radiculopathy, lumbar region: Secondary | ICD-10-CM | POA: Diagnosis not present

## 2023-02-14 ENCOUNTER — Ambulatory Visit (HOSPITAL_COMMUNITY)
Admission: RE | Admit: 2023-02-14 | Discharge: 2023-02-14 | Disposition: A | Discharge status: Home / Self Care | Payer: 59 | Source: Ambulatory Visit | Attending: Sports Medicine | Admitting: Sports Medicine

## 2023-02-14 DIAGNOSIS — Z8739 Personal history of other diseases of the musculoskeletal system and connective tissue: Secondary | ICD-10-CM | POA: Diagnosis present

## 2023-02-14 LAB — VAS US ABI WITH/WO TBI
Left ABI: 1.07
Right ABI: 1.07

## 2023-02-19 DIAGNOSIS — B957 Other staphylococcus as the cause of diseases classified elsewhere: Secondary | ICD-10-CM | POA: Diagnosis not present

## 2023-02-19 DIAGNOSIS — B370 Candidal stomatitis: Secondary | ICD-10-CM | POA: Diagnosis not present

## 2023-02-19 DIAGNOSIS — Z792 Long term (current) use of antibiotics: Secondary | ICD-10-CM | POA: Diagnosis not present

## 2023-02-19 DIAGNOSIS — Z969 Presence of functional implant, unspecified: Secondary | ICD-10-CM | POA: Diagnosis not present

## 2023-02-19 DIAGNOSIS — T8454XD Infection and inflammatory reaction due to internal left knee prosthesis, subsequent encounter: Secondary | ICD-10-CM | POA: Diagnosis not present

## 2023-02-20 DIAGNOSIS — K219 Gastro-esophageal reflux disease without esophagitis: Secondary | ICD-10-CM | POA: Diagnosis not present

## 2023-02-20 DIAGNOSIS — Z96652 Presence of left artificial knee joint: Secondary | ICD-10-CM | POA: Diagnosis not present

## 2023-02-20 DIAGNOSIS — Z471 Aftercare following joint replacement surgery: Secondary | ICD-10-CM | POA: Diagnosis not present

## 2023-02-27 ENCOUNTER — Encounter: Payer: 59 | Admitting: Licensed Clinical Social Worker

## 2023-03-13 ENCOUNTER — Ambulatory Visit: Payer: Self-pay | Admitting: Licensed Clinical Social Worker

## 2023-03-13 NOTE — Patient Instructions (Signed)
Visit Information  Thank you for taking time to visit with me today. Please don't hesitate to contact me if I can be of assistance to you.   Following are the goals we discussed today:   Goals Addressed             This Visit's Progress    Patient is recovering from past knee surgery. she goes to gym to exercise       Interventions:  LCSW spoke with Bethann Punches, spouse of client, about client needs Discussed sleeping issues of client. Client has difficulty sleeping Discussed medication procurement Reminded [name] of program support for client with RN, LCSW and Pharmacist Discussed ambulation of client Destinee Suing said client is now finished with physical therapy sessions. [name] said that  client goes to gym regularly to exercise Reviewed pain issues of client Encouraged client or Laporsche Espino to call LCSW as needed for SW support for client at [phone] [name] was appreciative of call from LCSW today        Our next appointment is by telephone on 04/24/23 at 2:00 PM   Please call the care guide team at [phone] if you need to cancel or reschedule your appointment.   If you are experiencing a Mental Health or Behavioral Health Crisis or need someone to talk to, please go to [facility] [address], [place] [phone])   The patient Shikha Sawdy, spouse, verbalized understanding of instructions, educational materials, and care plan provided today and DECLINED offer to receive copy of patient instructions, educational materials, and care plan.   The patient/ Aedyn Lycett, spouse,  has been provided with contact information for the care management team and has been advised to call with any health related questions or concerns.   Kelton Pillar.Lyrah Bradt MSW, LCSW Licensed Visual merchandiser [facility] Care Management [phone]

## 2023-03-13 NOTE — Patient Outreach (Signed)
  Care Coordination   Follow Up Visit Note   03/13/2023 Name: Methyl Kaehler MRN: [id number] DOB: Feb 16, 1976  Falena Winterfeld is a 47 y.o. year old female who sees Thekkekandam, Ihor Austin, MD for primary care. I spoke with  [name] / Bethann Punches, spouse of client via phone today about client needs.  What matters to the patients health and wellness today? Patient is recovering from past knee surgery.  She goes to gym to exercise    Goals Addressed             This Visit's Progress    Patient is recovering from past knee surgery. she goes to gym to exercise       Interventions:  LCSW spoke with Bethann Punches, spouse of client, about client needs Discussed sleeping issues of client. Client has difficulty sleeping Discussed medication procurement Reminded [name] of program support for client with RN, LCSW and Pharmacist Discussed ambulation of client Joquita Delgaudio said client is now finished with physical therapy sessions. [name] said that  client goes to gym regularly to exercise Reviewed pain issues of client Encouraged client or Leilanny Branco to call LCSW as needed for SW support for client at [phone] [name] was appreciative of call from LCSW today        SDOH assessments and interventions completed:  Yes  SDOH Interventions Today    Flowsheet Row Most Recent Value  SDOH Interventions   Depression Interventions/Treatment  Counseling  Physical Activity Interventions Other (Comments)  [client has completed physical therapy sessions. She goes to [facility] regularly to exercise]  Stress Interventions Other (Comment)  [client has stress in managing medical needs]        Care Coordination Interventions:  Yes, provided   Interventions Today    Flowsheet Row Most Recent Value  Chronic Disease   Chronic disease during today's visit Other  [spoke with Bethann Punches, spouse of client, about client needs]  General Interventions   General Interventions Discussed/Reviewed General Interventions  Discussed, Community Resources  [reviewed program support]  Exercise Interventions   Exercise Discussed/Reviewed Physical Activity  [exercises regularly]  Physical Activity Discussed/Reviewed Physical Activity Discussed  Education Interventions   Education Provided Provided Education  Provided Verbal Education On Community Resources  Mental Health Interventions   Mental Health Discussed/Reviewed Anxiety, Coping Strategies  [client is coping with medical needs. She is recovering from knee surgery. goes to gym regularly to exercise]  Pharmacy Interventions   Pharmacy Dicussed/Reviewed Pharmacy Topics Discussed  Safety Interventions   Safety Discussed/Reviewed Fall Risk       Follow up plan: Follow up call scheduled for 04/24/23 at 2:00 PM     Encounter Outcome:  Pt. Visit Completed   Kelton Pillar.Jarrick Fjeld MSW, LCSW Licensed Visual merchandiser [facility] Care Management [phone]

## 2023-03-16 ENCOUNTER — Other Ambulatory Visit: Payer: Self-pay | Admitting: Sports Medicine

## 2023-03-16 ENCOUNTER — Telehealth: Payer: Self-pay

## 2023-03-16 ENCOUNTER — Ambulatory Visit (INDEPENDENT_AMBULATORY_CARE_PROVIDER_SITE_OTHER): Payer: 59

## 2023-03-16 ENCOUNTER — Ambulatory Visit (INDEPENDENT_AMBULATORY_CARE_PROVIDER_SITE_OTHER): Payer: 59 | Admitting: Sports Medicine

## 2023-03-16 DIAGNOSIS — R051 Acute cough: Secondary | ICD-10-CM

## 2023-03-16 DIAGNOSIS — R059 Cough, unspecified: Secondary | ICD-10-CM | POA: Diagnosis not present

## 2023-03-16 DIAGNOSIS — R062 Wheezing: Secondary | ICD-10-CM | POA: Diagnosis not present

## 2023-03-16 LAB — POCT INFLUENZA A/B
Influenza A, POC: NEGATIVE
Influenza B, POC: NEGATIVE

## 2023-03-16 MED ORDER — PREDNISONE 50 MG PO TABS
50.0000 mg | ORAL_TABLET | Freq: Every day | ORAL | 0 refills | Status: DC
Start: 2023-03-16 — End: 2023-05-14

## 2023-03-16 MED ORDER — AZITHROMYCIN 250 MG PO TABS
ORAL_TABLET | ORAL | 0 refills | Status: DC
Start: 2023-03-16 — End: 2023-05-09

## 2023-03-16 MED ORDER — HYDROCOD POLI-CHLORPHE POLI ER 10-8 MG/5ML PO SUER: 5.0000 mL | Freq: Two times a day (BID) | ORAL | 0 refills | Status: DC | PRN

## 2023-03-16 NOTE — Progress Notes (Signed)
    Procedures performed today:    None.  Independent interpretation of notes and tests performed by another provider:   None.  Brief History, Exam, Impression, and Recommendations:    Coughing Several days of increasing cough, shortness of breath, wheezing, fatigue and headache. COVID test negative at home, flu test negative here. On exam she appears unwell, she is coughing in the exam room, lungs show coarse sounds with inspiratory and expiratory wheezes. Will get chest x-ray, she will get azithromycin, prednisone. Return to see me if not better in a week.  We filled out her disability paperwork today  I spent 30 minutes of total time managing this patient today, this includes chart review, face to face, and non-face to face time.  ____________________________________________ Ihor Austin. Benjamin Stain, M.D., ABFM., CAQSM., AME. Primary Care and Sports Medicine [facility]  Adjunct Professor of Family Medicine  [place] [employer] [facility] [employer]  Restaurant manager, fast food

## 2023-03-16 NOTE — Telephone Encounter (Signed)
She can just stop the tramadol when she takes Tussionex and pay cash for the Tussionex to bypass insurance

## 2023-03-16 NOTE — Assessment & Plan Note (Signed)
Several days of increasing cough, shortness of breath, wheezing, fatigue and headache. COVID test negative at home, flu test negative here. On exam she appears unwell, she is coughing in the exam room, lungs show coarse sounds with inspiratory and expiratory wheezes. Will get chest x-ray, she will get azithromycin, prednisone. Return to see me if not better in a week.  We filled out her disability paperwork today

## 2023-03-16 NOTE — Telephone Encounter (Signed)
The pharmacy called and left a message stating patient is taking Tramadol, Flexeril and Lyrica. It gives them a warning when they try to fill the Tussinex. Also it isn't covered by her insurance.

## 2023-03-16 NOTE — Addendum Note (Signed)
Addended by: Carren Rang A on: 03/16/2023 03:48 PM   Modules accepted: Orders

## 2023-03-19 NOTE — Telephone Encounter (Signed)
Pharmacy and patient advised

## 2023-03-22 ENCOUNTER — Other Ambulatory Visit: Payer: Self-pay | Admitting: Sports Medicine

## 2023-03-22 DIAGNOSIS — I10 Essential (primary) hypertension: Secondary | ICD-10-CM

## 2023-03-23 ENCOUNTER — Ambulatory Visit: Payer: 59 | Admitting: Sports Medicine

## 2023-03-27 ENCOUNTER — Encounter: Payer: Self-pay | Admitting: Obstetrics & Gynecology

## 2023-04-11 ENCOUNTER — Other Ambulatory Visit: Payer: Self-pay | Admitting: Sports Medicine

## 2023-04-12 DIAGNOSIS — M5382 Other specified dorsopathies, cervical region: Secondary | ICD-10-CM | POA: Diagnosis not present

## 2023-04-12 DIAGNOSIS — M530 Cervicocranial syndrome: Secondary | ICD-10-CM | POA: Diagnosis not present

## 2023-04-12 DIAGNOSIS — M9903 Segmental and somatic dysfunction of lumbar region: Secondary | ICD-10-CM | POA: Diagnosis not present

## 2023-04-12 DIAGNOSIS — M9901 Segmental and somatic dysfunction of cervical region: Secondary | ICD-10-CM | POA: Diagnosis not present

## 2023-04-12 DIAGNOSIS — M9902 Segmental and somatic dysfunction of thoracic region: Secondary | ICD-10-CM | POA: Diagnosis not present

## 2023-04-18 DIAGNOSIS — M530 Cervicocranial syndrome: Secondary | ICD-10-CM | POA: Diagnosis not present

## 2023-04-18 DIAGNOSIS — M9901 Segmental and somatic dysfunction of cervical region: Secondary | ICD-10-CM | POA: Diagnosis not present

## 2023-04-18 DIAGNOSIS — M9903 Segmental and somatic dysfunction of lumbar region: Secondary | ICD-10-CM | POA: Diagnosis not present

## 2023-04-18 DIAGNOSIS — M5382 Other specified dorsopathies, cervical region: Secondary | ICD-10-CM | POA: Diagnosis not present

## 2023-04-18 DIAGNOSIS — M9902 Segmental and somatic dysfunction of thoracic region: Secondary | ICD-10-CM | POA: Diagnosis not present

## 2023-04-23 ENCOUNTER — Ambulatory Visit: Payer: 59 | Admitting: Obstetrics & Gynecology

## 2023-04-24 ENCOUNTER — Ambulatory Visit: Payer: Self-pay | Admitting: Licensed Clinical Social Worker

## 2023-04-24 NOTE — Patient Outreach (Signed)
  Care Coordination   04/24/2023 Name: Evelyn Sanchez MRN: [id number] DOB: 01/05/1976   Care Coordination Outreach Attempts:  An unsuccessful telephone outreach was attempted today to offer the patient information about available care coordination services.  Follow Up Plan:  Additional outreach attempts will be made to offer the patient care coordination information and services.   Encounter Outcome:  No Answer   Care Coordination Interventions:  No, not indicated    Kelton Pillar.Donnesha Karg MSW, LCSW Licensed Visual merchandiser [facility] Care Management [phone]

## 2023-05-09 ENCOUNTER — Other Ambulatory Visit: Payer: Self-pay | Admitting: Sports Medicine

## 2023-05-09 ENCOUNTER — Telehealth: Payer: Self-pay | Admitting: Family Medicine

## 2023-05-09 DIAGNOSIS — Z79891 Long term (current) use of opiate analgesic: Secondary | ICD-10-CM | POA: Diagnosis not present

## 2023-05-09 DIAGNOSIS — G894 Chronic pain syndrome: Secondary | ICD-10-CM | POA: Diagnosis not present

## 2023-05-09 DIAGNOSIS — F331 Major depressive disorder, recurrent, moderate: Secondary | ICD-10-CM

## 2023-05-09 DIAGNOSIS — M5030 Other cervical disc degeneration, unspecified cervical region: Secondary | ICD-10-CM | POA: Diagnosis not present

## 2023-05-09 DIAGNOSIS — M170 Bilateral primary osteoarthritis of knee: Secondary | ICD-10-CM | POA: Diagnosis not present

## 2023-05-09 DIAGNOSIS — M5416 Radiculopathy, lumbar region: Secondary | ICD-10-CM | POA: Diagnosis not present

## 2023-05-09 NOTE — Telephone Encounter (Signed)
[place]

## 2023-05-10 ENCOUNTER — Telehealth: Payer: Self-pay | Admitting: *Deleted

## 2023-05-10 NOTE — Progress Notes (Signed)
  Care Coordination Note  05/10/2023 Name: Evelyn Sanchez MRN: [id number] DOB: 08/19/1976  Evelyn Sanchez is a 47 y.o. year old female who is a primary care patient of Benjamin Stain, Ihor Austin, MD and is actively engaged with the care management team. I reached out to [name] by phone today to assist with re-scheduling a follow up visit with the Licensed Clinical Social Worker  Follow up plan: Unsuccessful telephone outreach attempt made.   [facility]  Care Coordination Care Guide  Direct Dial: [phone]

## 2023-05-14 ENCOUNTER — Ambulatory Visit (INDEPENDENT_AMBULATORY_CARE_PROVIDER_SITE_OTHER): Payer: 59 | Admitting: Obstetrics & Gynecology

## 2023-05-14 ENCOUNTER — Encounter: Payer: Self-pay | Admitting: Obstetrics & Gynecology

## 2023-05-14 ENCOUNTER — Other Ambulatory Visit (HOSPITAL_COMMUNITY)
Admission: RE | Admit: 2023-05-14 | Discharge: 2023-05-14 | Disposition: A | Discharge status: Home / Self Care | Payer: 59 | Source: Ambulatory Visit | Attending: Obstetrics & Gynecology | Admitting: Obstetrics & Gynecology

## 2023-05-14 VITALS — BP 148/80 | HR 72 | Resp 16 | Ht 64.0 in | Wt 197.0 lb

## 2023-05-14 DIAGNOSIS — N950 Postmenopausal bleeding: Secondary | ICD-10-CM | POA: Diagnosis not present

## 2023-05-14 DIAGNOSIS — Z3202 Encounter for pregnancy test, result negative: Secondary | ICD-10-CM

## 2023-05-14 LAB — POCT URINE PREGNANCY: Preg Test, Ur: NEGATIVE

## 2023-05-14 NOTE — Progress Notes (Signed)
   Subjective:    Patient ID: Evelyn Sanchez, female    DOB: [birth year], 47 y.o.   MRN: [id number]  HPI Evelyn Sanchez is a 47 year old female who presents for postmenopausal bleeding.  Patient had episodes in April and June.  They lasted 1 to 2 days.  1 episode required a pad.  The other 2 episodes were just spotting. Marcus has not had any new medical problems or medications.  She is not on blood thinners.  Patient denies association with bowel movements, urination or intercourse.   Review of Systems  Constitutional: Negative.   Respiratory: Negative.    Cardiovascular: Negative.   Gastrointestinal: Negative.   Genitourinary:  Positive for vaginal bleeding.       Objective:   Physical Exam Vitals reviewed.  Constitutional:      General: She is not in acute distress.    Appearance: She is well-developed.  HENT:     Head: Normocephalic and atraumatic.  Eyes:     Conjunctiva/sclera: Conjunctivae normal.  Cardiovascular:     Rate and Rhythm: Normal rate.  Pulmonary:     Effort: Pulmonary effort is normal.  Abdominal:     General: There is no distension.     Palpations: Abdomen is soft.     Tenderness: There is no abdominal tenderness.  Genitourinary:    Comments: Tanner V Vulva:  No lesion Vagina:  Pink, no lesions, no discharge, no blood Cervix:  No CMT, no lesion  Skin:    General: Skin is warm and dry.  Neurological:     Mental Status: She is alert and oriented to person, place, and time.  Psychiatric:        Mood and Affect: Mood normal.    Vitals:   05/14/23 1550  BP: (!) 148/80  Pulse: 72  Resp: 16  Weight: 197 lb (89.4 kg)  Height: 5\' 4"  (1.626 m)      Assessment & Plan:  47 year old female with postmenopausal bleeding  Endometrial biopsy attempted today.  Uterus sounded to little over 5 cm.  This likely did not get past the internal os.  Will get pelvic ultrasound.  If less than 5 mm, we will monitor.  If 5 mm or greater, we will proceed with biopsy attempt  again with Cytotec.  ENDOMETRIAL BIOPSY     The indications for endometrial biopsy were reviewed.   Risks of the biopsy including cramping, bleeding, infection, uterine perforation, inadequate specimen and need for additional procedures  were discussed. The patient states she understands and agrees to undergo procedure today. Consent was signed. Time out was performed. Urine HCG was negative. A sterile speculum was placed in the patient's vagina and the cervix was prepped with Betadine. A single-toothed tenaculum was placed on the anterior lip of the cervix to stabilize it. The 3 mm pipelle was introduced into the endometrial cavity to approx 5.5 cm.  Unlikely through internal os.  Used cervical dilators without success. The instruments were removed from the patient's vagina. Minimal bleeding from the cervix was noted. The patient tolerated the procedure well.

## 2023-05-16 NOTE — Progress Notes (Signed)
  Care Coordination Note  05/16/2023 Name: Evelyn Sanchez MRN: [id number] DOB: Mar 19, 1976  Evelyn Sanchez is a 47 y.o. year old female who is a primary care patient of Benjamin Stain, Ihor Austin, MD and is actively engaged with the care management team. I reached out to [name] by phone today to assist with re-scheduling a follow up visit with the Licensed Clinical Social Worker  Follow up plan: Unsuccessful telephone outreach attempt made. A HIPAA compliant phone message was left for the patient providing contact information and requesting a return call.  We have been unable to make contact with the patient for follow up. The care management team is available to follow up with the patient after provider conversation with the patient regarding recommendation for care management engagement and subsequent re-referral to the care management team.   [facility] Coordination Care Guide  Direct Dial: [phone]

## 2023-05-21 ENCOUNTER — Ambulatory Visit: Payer: 59

## 2023-05-21 DIAGNOSIS — N950 Postmenopausal bleeding: Secondary | ICD-10-CM

## 2023-05-23 ENCOUNTER — Encounter: Payer: Self-pay | Admitting: Obstetrics & Gynecology

## 2023-05-24 ENCOUNTER — Other Ambulatory Visit: Payer: Self-pay | Admitting: Sports Medicine

## 2023-05-24 DIAGNOSIS — G43909 Migraine, unspecified, not intractable, without status migrainosus: Secondary | ICD-10-CM

## 2023-05-28 ENCOUNTER — Encounter: Payer: Self-pay | Admitting: Obstetrics & Gynecology

## 2023-05-29 ENCOUNTER — Encounter: Payer: Self-pay | Admitting: Obstetrics & Gynecology

## 2023-06-05 ENCOUNTER — Ambulatory Visit (INDEPENDENT_AMBULATORY_CARE_PROVIDER_SITE_OTHER): Payer: 59 | Admitting: Sports Medicine

## 2023-06-05 VITALS — Ht 64.0 in | Wt 195.0 lb

## 2023-06-05 DIAGNOSIS — Z0000 Encounter for general adult medical examination without abnormal findings: Secondary | ICD-10-CM | POA: Diagnosis not present

## 2023-06-05 NOTE — Patient Instructions (Signed)
MEDICARE ANNUAL WELLNESS VISIT Health Maintenance Summary and Written Plan of Care  Evelyn Sanchez ,  Thank you for allowing me to perform your Medicare Annual Wellness Visit and for your ongoing commitment to your health.   Health Maintenance & Immunization History Health Maintenance  Topic Date Due   MAMMOGRAM  09/01/2022   COVID-19 Vaccine (3 - Pfizer risk series) 06/21/2023 (Originally 01/24/2021)   INFLUENZA VACCINE  01/14/2024 (Originally 05/17/2023)   Hepatitis C Screening  06/04/2024 (Originally 04/11/1993)   HIV Screening  06/04/2024 (Originally 04/11/1990)   Fecal DNA (Cologuard)  04/22/2024   Medicare Annual Wellness (AWV)  06/04/2024   PAP SMEAR-Modifier  08/28/2025   DTaP/Tdap/Td (2 - Td or Tdap) 12/13/2026   HPV VACCINES  Aged Out   Immunization History  Administered Date(s) Administered   Influenza Split 08/24/2011   Influenza, Seasonal, Injecte, Preservative Fre 07/22/2015, 08/31/2018   Influenza,inj,Quad PF,6+ Mos 11/15/2016, 10/22/2017, 08/31/2018, 06/29/2019, 07/23/2020, 06/21/2022   Influenza,inj,Quad PF,6-35 Mos 06/29/2019   Influenza,trivalent, recombinat, inj, PF 08/24/2011   Influenza-Unspecified 08/24/2011, 07/22/2015, 11/15/2016, 10/22/2017, 08/31/2018, 06/29/2019, 07/23/2020   PFIZER Comirnaty(Gray Top)Covid-19 Tri-Sucrose Vaccine 12/06/2020, 12/27/2020   Tdap 12/13/2016    These are the patient goals that we discussed:  Goals Addressed               This Visit's Progress     Patient Stated (pt-stated)        06/05/2023 AWV Goal: Exercise for General Health  Patient will verbalize understanding of the benefits of increased physical activity: Exercising regularly is important. It will improve your overall fitness, flexibility, and endurance. Regular exercise also will improve your overall health. It can help you control your weight, reduce stress, and improve your bone density. Over the next year, patient will increase physical activity as tolerated  with a goal of at least 150 minutes of moderate physical activity per week.  You can tell that you are exercising at a moderate intensity if your heart starts beating faster and you start breathing faster but can still hold a conversation. Moderate-intensity exercise ideas include: Walking 1 mile (1.6 km) in about 15 minutes Biking Hiking Golfing Dancing Water aerobics Patient will verbalize understanding of everyday activities that increase physical activity by providing examples like the following: Yard work, such as: Insurance underwriter Gardening Washing windows or floors Patient will be able to explain general safety guidelines for exercising:  Before you start a new exercise program, talk with your health care provider. Do not exercise so much that you hurt yourself, feel dizzy, or get very short of breath. Wear comfortable clothes and wear shoes with good support. Drink plenty of water while you exercise to prevent dehydration or heat stroke. Work out until your breathing and your heartbeat get faster.          This is a list of Health Maintenance Items that are overdue or due now: Health Maintenance Due  Topic Date Due   MAMMOGRAM  09/01/2022   Influenza vaccine Screening mammography - scheduled  Orders/Referrals Placed Today: No orders of the defined types were placed in this encounter.  (Contact our referral department at [phone] if you have not spoken with someone about your referral appointment within the next 5 days)    Follow-up Plan Follow-up with Monica Becton, MD as planned Schedule influenza vaccine at the pharmacy or nurse visit in the office. Medicare wellness visit in one year.  Patient will access AVS on my chart.      Health Maintenance, Female Adopting a healthy lifestyle and getting preventive care are important in promoting health and wellness. Ask  your health care provider about: The right schedule for you to have regular tests and exams. Things you can do on your own to prevent diseases and keep yourself healthy. What should I know about diet, weight, and exercise? Eat a healthy diet  Eat a diet that includes plenty of vegetables, fruits, low-fat dairy products, and lean protein. Do not eat a lot of foods that are high in solid fats, added sugars, or sodium. Maintain a healthy weight Body mass index (BMI) is used to identify weight problems. It estimates body fat based on height and weight. Your health care provider can help determine your BMI and help you achieve or maintain a healthy weight. Get regular exercise Get regular exercise. This is one of the most important things you can do for your health. Most adults should: Exercise for at least 150 minutes each week. The exercise should increase your heart rate and make you sweat (moderate-intensity exercise). Do strengthening exercises at least twice a week. This is in addition to the moderate-intensity exercise. Spend less time sitting. Even light physical activity can be beneficial. Watch cholesterol and blood lipids Have your blood tested for lipids and cholesterol at 48 years of age, then have this test every 5 years. Have your cholesterol levels checked more often if: Your lipid or cholesterol levels are high. You are older than 48 years of age. You are at high risk for heart disease. What should I know about cancer screening? Depending on your health history and family history, you may need to have cancer screening at various ages. This may include screening for: Breast cancer. Cervical cancer. Colorectal cancer. Skin cancer. Lung cancer. What should I know about heart disease, diabetes, and high blood pressure? Blood pressure and heart disease High blood pressure causes heart disease and increases the risk of stroke. This is more likely to develop in people who have high  blood pressure readings or are overweight. Have your blood pressure checked: Every 3-5 years if you are 34-53 years of age. Every year if you are 23 years old or older. Diabetes Have regular diabetes screenings. This checks your fasting blood sugar level. Have the screening done: Once every three years after age 16 if you are at a normal weight and have a low risk for diabetes. More often and at a younger age if you are overweight or have a high risk for diabetes. What should I know about preventing infection? Hepatitis B If you have a higher risk for hepatitis B, you should be screened for this virus. Talk with your health care provider to find out if you are at risk for hepatitis B infection. Hepatitis C Testing is recommended for: Everyone born from [birth year] through 1966. Anyone with known risk factors for hepatitis C. Sexually transmitted infections (STIs) Get screened for STIs, including gonorrhea and chlamydia, if: You are sexually active and are younger than 48 years of age. You are older than 47 years of age and your health care provider tells you that you are at risk for this type of infection. Your sexual activity has changed since you were last screened, and you are at increased risk for chlamydia or gonorrhea. Ask your health care provider if you are at risk. Ask your health care provider about whether you are at high risk for HIV.  Your health care provider may recommend a prescription medicine to help prevent HIV infection. If you choose to take medicine to prevent HIV, you should first get tested for HIV. You should then be tested every 3 months for as long as you are taking the medicine. Pregnancy If you are about to stop having your period (premenopausal) and you may become pregnant, seek counseling before you get pregnant. Take 400 to 800 micrograms (mcg) of folic acid every day if you become pregnant. Ask for birth control (contraception) if you want to prevent  pregnancy. Osteoporosis and menopause Osteoporosis is a disease in which the bones lose minerals and strength with aging. This can result in bone fractures. If you are 73 years old or older, or if you are at risk for osteoporosis and fractures, ask your health care provider if you should: Be screened for bone loss. Take a calcium or vitamin D supplement to lower your risk of fractures. Be given hormone replacement therapy (HRT) to treat symptoms of menopause. Follow these instructions at home: Alcohol use Do not drink alcohol if: Your health care provider tells you not to drink. You are pregnant, may be pregnant, or are planning to become pregnant. If you drink alcohol: Limit how much you have to: 0-1 drink a day. Know how much alcohol is in your drink. In the [place] one drink equals one 12 oz bottle of beer (355 mL), one 5 oz glass of wine (148 mL), or one 1 oz glass of hard liquor (44 mL). Lifestyle Do not use any products that contain nicotine or tobacco. These products include cigarettes, chewing tobacco, and vaping devices, such as e-cigarettes. If you need help quitting, ask your health care provider. Do not use street drugs. Do not share needles. Ask your health care provider for help if you need support or information about quitting drugs. General instructions Schedule regular health, dental, and eye exams. Stay current with your vaccines. Tell your health care provider if: You often feel depressed. You have ever been abused or do not feel safe at home. Summary Adopting a healthy lifestyle and getting preventive care are important in promoting health and wellness. Follow your health care provider's instructions about healthy diet, exercising, and getting tested or screened for diseases. Follow your health care provider's instructions on monitoring your cholesterol and blood pressure. This information is not intended to replace advice given to you by your health care provider.  Make sure you discuss any questions you have with your health care provider. Document Revised: 02/21/2021 Document Reviewed: 02/21/2021 Elsevier Patient Education  2024 [employer].

## 2023-06-05 NOTE — Progress Notes (Signed)
MEDICARE ANNUAL WELLNESS VISIT  06/05/2023  Telephone Visit Disclaimer This Medicare AWV was conducted by telephone due to national recommendations for restrictions regarding the COVID-19 Pandemic (e.g. social distancing).  I verified, using two identifiers, that I am speaking with Evelyn Sanchez or their authorized healthcare agent. I discussed the limitations, risks, security, and privacy concerns of performing an evaluation and management service by telephone and the potential availability of an in-person appointment in the future. The patient expressed understanding and agreed to proceed.  Location of Patient: Home Location of Provider (nurse):  In the office.  Subjective:    Evelyn Sanchez is a 47 y.o. female patient of Thekkekandam, Ihor Austin, MD who had a Medicare Annual Wellness Visit today via telephone. Evelyn Sanchez is Disabled and lives with their son. she has 2 children. she reports that she is socially active and does interact with friends/family regularly. she is moderately physically active and enjoys adult coloring books and creating rugs.  Patient Care Team: Monica Becton, MD as PCP - General (Sports Medicine) Nahser, Deloris Ping, MD as PCP - Cardiology (Cardiology)     06/05/2023   10:05 AM 08/08/2021   12:10 PM 12/11/2017    9:42 AM 07/29/2012   11:49 AM 07/22/2012   11:44 AM  Advanced Directives  Does Patient Have a Medical Advance Directive? No No No  Patient does not have advance directive  Does patient want to make changes to medical advance directive? No - Patient declined      Would patient like information on creating a medical advance directive?  No - Patient declined     Pre-existing out of facility DNR order (yellow form or pink MOST form)    No     Hospital Utilization Over the Past 12 Months: # of hospitalizations or ER visits: 0 # of surgeries: 1  Review of Systems    Patient reports that her overall health is unchanged compared to last year.  History  obtained from chart review and the patient  Patient Reported Readings (BP, Pulse, CBG, Weight, etc) Weight: 195 lb Height: 26f4 BP: unable to obtain Pulse: unable to obtain Per patient no change in vitals since last visit, unable to obtain new vitals due to telehealth visit  Pain Assessment Pain : 0-10 Pain Score: 5  Pain Type: Chronic pain Pain Location: Back Pain Radiating Towards: knee Pain Descriptors / Indicators: Aching Pain Onset: More than a month ago Pain Frequency: Constant Pain Relieving Factors: medication  Pain Relieving Factors: medication  Current Medications & Allergies (verified) Allergies as of 06/05/2023       Reactions   Nickel Rash   Earrings cause redness and rash Earrings cause redness and rash   Ambien [zolpidem Tartrate] Other (See Comments)   hallucinations        Medication List        Accurate as of June 05, 2023 10:18 AM. If you have any questions, ask your nurse or doctor.          albuterol 108 (90 Base) MCG/ACT inhaler Commonly known as: VENTOLIN HFA INHALE 2 PUFFS BY MOUTH EVERY 6 HOURS AS NEEDED FOR WHEEZING   albuterol (2.5 MG/3ML) 0.083% nebulizer solution Commonly known as: PROVENTIL USE 1 VIAL VIA NEBULIZER EVERY 4 HOURS AS NEEDED FOR WHEEZING OR SHORTNESS OF BREATH   amLODipine 5 MG tablet Commonly known as: NORVASC TAKE 1 TABLET BY MOUTH EVERY DAY   BENGAY EX Apply 1 application topically 2 (two) times daily as needed (  for pain).   busPIRone 15 MG tablet Commonly known as: BUSPAR TAKE 1 TABLET BY MOUTH 3 TIMES A DAY   cephALEXin 500 MG capsule Commonly known as: KEFLEX Take by mouth.   cyclobenzaprine 10 MG tablet Commonly known as: FLEXERIL Take 10 mg by mouth 2 (two) times daily as needed.   DULoxetine 60 MG capsule Commonly known as: CYMBALTA TAKE 2 CAPSULES BY MOUTH EVERY DAY   lisinopril-hydrochlorothiazide 20-25 MG tablet Commonly known as: ZESTORETIC TAKE 1/2 TABLET BY MOUTH DAILY    Nebulizer Air Tube/Plugs Misc Mask and tubing for use as needed   nystatin 100000 UNIT/ML suspension Commonly known as: MYCOSTATIN Take by mouth.   oxyCODONE-acetaminophen 7.5-325 MG tablet Commonly known as: PERCOCET Take 1 tablet by mouth every 6 (six) hours as needed.   pregabalin 75 MG capsule Commonly known as: Lyrica 1 capsule p.o. nightly for a week then twice daily for a week then 3 times daily   rizatriptan 10 MG tablet Commonly known as: MAXALT TAKE 1 TABLET BY MOUTH AS NEEDED FOR MIGRAINE. MAY REPEAT IN 2 HOURS IF NEEDED   topiramate 50 MG tablet Commonly known as: TOPAMAX TAKE 1 TABLET BY MOUTH TWICE A DAY   traMADol 100 MG 24 hr tablet Commonly known as: ULTRAM-ER Take 100 mg by mouth daily.        History (reviewed): Past Medical History:  Diagnosis Date   Abnormal pap 1999   colpo   Abnormal uterine bleeding 05/02/2012   Anxiety    Arthritis of both knees 2011   Back pain    Basal cell carcinoma    Benign essential hypertension 10/22/2017   Bilateral hand numbness 03/02/2016   Bipolar I disorder, severe, current or most recent episode depressed, with rapid cycling (HCC) 06/11/2014   07/05/2018 PHQ 9 = 20, GAD7 = 15 08/02/2018 PHQ 9 = 14, GAD7 = 18 09/02/2018 PHQ 9 = 7, GAD7 = 7 09/30/2018 PHQ 9 = 8, GAD 7 = 8 06/10/2019 PHQ 9 = 8, GAD 7 = 16   Chest pain, typical 09/30/2018   Chlamydia    Chronic pain    Chronic pain syndrome 04/06/2016   Depression    Epidural anesthesia-induced headache during labor and delivery 09/30/2021   Family history of ovarian cancer 05/02/2012   Femoral neuropathy of left lower extremity 11/26/2014   NCV/EMG: Chronic neurogenic changes to left vastus lateralis, could be old L4 radiculopathy, femoral neuropathy, focal muscle damage.  No other evidence of radiculopathy.    Fracture of coronoid process of ulna, left, closed 11/12/2014   Ganglion cyst of flexor tendon sheath of finger, right 07/05/2018   Ganglion cyst of  wrist 01/01/2014   GERD (gastroesophageal reflux disease)    Hair loss 06/10/2019   Hypertension    Lateral epicondylitis of right elbow 11/15/2016   Lumbar facet arthropathy 03/08/2017   Malignant melanoma (HCC) 11/19/2017   Melanoma (HCC)    Melanoma (HCC)    Migraine without status migrainosus, not intractable 03/19/2014   Overview:  Last Assessment & Plan:  Per patient request referral to neurology.   Migraines    Obesity 02/03/2014   Ovarian cyst    Periapical abscess 01/08/2019   Pneumonia    Post traumatic stress disorder 03/09/2012   Premenstrual tension syndrome 02/10/2014   Overview:  Overview:  OCPs started 02/10/14   Primary osteoarthritis of left knee 01/01/2014   PTSD (post-traumatic stress disorder)    Severe acute respiratory syndrome coronavirus 2 (SARS-CoV-2) detected 10/14/2018  Shivering 03/05/2019   Trichomonas    Vaginal Pap smear, abnormal    Weakness of both legs 12/17/2012   Past Surgical History:  Procedure Laterality Date   ABLATION     ADENOIDECTOMY     APPENDECTOMY  1996   DILATION AND CURETTAGE OF UTERUS  1998   edometrial ablation  2013   KNEE ARTHROSCOPY  1991   Left Kneex2   LYMPH NODE BIOPSY Left 12/11/2017   Procedure: SENTINEL LYMPH NODE MAPPING  BIOPSY;  Surgeon: Almond Lint, MD;  Location: [facility] OR;  Service: General;  Laterality: Left;   MELANOMA EXCISION Left 12/11/2017   Procedure: EXCISION OF PRIOR SCAR LEFT SHOULDER MELANOMA WITH CLOSURE;  Surgeon: Almond Lint, MD;  Location: [facility] OR;  Service: General;  Laterality: Left;   Tonsilectomy  1984   TONSILLECTOMY     TOTAL KNEE ARTHROPLASTY  12/18/2019   TOTAL KNEE REVISION     x2 01/09/20 and 01/20/20   TUBAL LIGATION  1999   tubes in ears     4x   TUBOPLASTY / TUBOTUBAL ANASTOMOSIS  1980   Four times between 1980-1990   WISDOM TOOTH EXTRACTION     Family History  Problem Relation Age of Onset   Hypertension Mother    Depression Mother    Diabetes type II Mother    Diabetes  Mother    Parkinson's disease Mother    Heart attack Father    Hypertension Father    Diabetes type II Father    Diabetes Father    Hyperlipidemia Father    Prostate cancer Father    ALS Father    Heart disease Father    Parkinson's disease Father    Depression Sister    Diabetes type II Sister    Diabetes Sister    High blood pressure Sister    Cancer Maternal Aunt    Alcohol abuse Maternal Uncle    Cerebral aneurysm Maternal Uncle    Alcohol abuse Paternal Uncle    Hypertension Maternal Grandmother    Cancer Maternal Grandmother        cervical   Hypertension Maternal Grandfather    Diabetes Maternal Grandfather    Stroke Maternal Grandfather    Stomach cancer Maternal Grandfather    Hypertension Paternal Grandmother    Aneurysm Paternal Grandmother    Cerebral aneurysm Paternal Grandmother    Hypertension Paternal Grandfather    Diabetes Paternal Grandfather    Breast cancer Cousin        10s   Breast cancer Cousin        10s   Hyperlipidemia Brother    Social History   Socioeconomic History   Marital status: Legally Separated    Spouse name: [name]   Number of children: 2   Years of education: 12   Highest education level: 12th grade  Occupational History   Occupation: Legally disabled.  Tobacco Use   Smoking status: Former    Current packs/day: 0.00    Average packs/day: 0.5 packs/day for 10.0 years (5.0 ttl pk-yrs)    Types: Cigarettes    Start date: 11/09/1999    Quit date: 11/08/2009    Years since quitting: 13.5   Smokeless tobacco: Never  Vaping Use   Vaping status: Never Used  Substance and Sexual Activity   Alcohol use: No   Drug use: No   Sexual activity: Yes    Partners: Male    Birth control/protection: Other-see comments    Comment: Tubal Ligation  Other Topics  Concern   Not on file  Social History Narrative   Lives with her son. She has been separated from her husband for over a year. She enjoys coloring and enjoys creating rugs.    Social Determinants of Health   Financial Resource Strain: Low Risk  (06/05/2023)   Overall Financial Resource Strain (CARDIA)    Difficulty of Paying Living Expenses: Not hard at all  Food Insecurity: No Food Insecurity (06/05/2023)   Hunger Vital Sign    Worried About Running Out of Food in the Last Year: Never true    Ran Out of Food in the Last Year: Never true  Transportation Needs: No Transportation Needs (06/05/2023)   PRAPARE - Administrator, Civil Service (Medical): No    Lack of Transportation (Non-Medical): No  Physical Activity: Insufficiently Active (06/05/2023)   Exercise Vital Sign    Days of Exercise per Week: 4 days    Minutes of Exercise per Session: 30 min  Stress: Stress Concern Present (06/05/2023)   [employer] of Occupational Health - Occupational Stress Questionnaire    Feeling of Stress : To some extent  Social Connections: Socially Isolated (06/05/2023)   Social Connection and Isolation Panel [NHANES]    Frequency of Communication with Friends and Family: More than three times a week    Frequency of Social Gatherings with Friends and Family: Once a week    Attends Religious Services: Never    Database administrator or Organizations: No    Attends Banker Meetings: Never    Marital Status: Separated    Activities of Daily Living    06/05/2023   10:07 AM  In your present state of health, do you have any difficulty performing the following activities:  Hearing? 1  Comment some hearing loss  Vision? 0  Difficulty concentrating or making decisions? 1  Comment some memory loss  Walking or climbing stairs? 1  Comment stairs are difficult  Dressing or bathing? 0  Doing errands, shopping? 1  Comment doesn't do them alone  Preparing Food and eating ? N  Using the Toilet? N  In the past six months, have you accidently leaked urine? Y  Do you have problems with loss of bowel control? N  Managing your Medications? N  Managing  your Finances? N  Housekeeping or managing your Housekeeping? N    Patient Education/ Literacy How often do you need to have someone help you when you read instructions, pamphlets, or other written materials from your doctor or pharmacy?: 1 - Never What is the last grade level you completed in school?: 12th grade  Exercise    Diet Patient reports consuming  2-3  meals a day and 2 snack(s) a day Patient reports that her primary diet is: Regular Patient reports that she does have regular access to food.   Depression Screen    06/05/2023   10:06 AM 03/13/2023    9:37 AM 01/30/2023   10:12 AM 01/16/2023    9:26 AM 01/15/2023    1:08 PM 12/11/2022    1:03 PM 11/06/2022   11:43 AM  PHQ 2/9 Scores  PHQ - 2 Score 1 2 2 2 2 2 2   PHQ- 9 Score  5 5 7 5 5 6      Fall Risk    06/05/2023   10:05 AM 08/08/2021   12:10 PM 04/07/2020   10:25 AM  Fall Risk   Falls in the past year? 1 1 1   Number  falls in past yr: 0 1 0  Injury with Fall? 0 1 0  Risk for fall due to : Impaired mobility History of fall(s);Impaired balance/gait;Impaired mobility   Follow up Falls evaluation completed Falls evaluation completed;Education provided;Falls prevention discussed      Objective:  Rilei Lightcap seemed alert and oriented and she participated appropriately during our telephone visit.  Blood Pressure Weight BMI  BP Readings from Last 3 Encounters:  05/14/23 (!) 148/80  08/28/22 122/89  04/24/22 110/72   Wt Readings from Last 3 Encounters:  06/05/23 195 lb (88.5 kg)  05/14/23 197 lb (89.4 kg)  08/28/22 188 lb (85.3 kg)   BMI Readings from Last 1 Encounters:  06/05/23 33.47 kg/m    *Unable to obtain current vital signs, weight, and BMI due to telephone visit type  Hearing/Vision  Floris did not seem to have difficulty with hearing/understanding during the telephone conversation Reports that she has had a formal eye exam by an eye care professional within the past year Reports that she has not had a  formal hearing evaluation within the past year *Unable to fully assess hearing and vision during telephone visit type  Cognitive Function:    06/05/2023   10:12 AM 08/08/2021   12:24 PM  6CIT Screen  What Year? 0 points 0 points  What month? 0 points 0 points  What time? 0 points 0 points  Count back from 20 0 points 0 points  Months in reverse 0 points 2 points  Repeat phrase 0 points 0 points  Total Score 0 points 2 points   (Normal:0-7, Significant for Dysfunction: >8)  Normal Cognitive Function Screening: Yes   Immunization & Health Maintenance Record Immunization History  Administered Date(s) Administered   Influenza Split 08/24/2011   Influenza, Seasonal, Injecte, Preservative Fre 07/22/2015, 08/31/2018   Influenza,inj,Quad PF,6+ Mos 11/15/2016, 10/22/2017, 08/31/2018, 06/29/2019, 07/23/2020, 06/21/2022   Influenza,inj,Quad PF,6-35 Mos 06/29/2019   Influenza,trivalent, recombinat, inj, PF 08/24/2011   Influenza-Unspecified 08/24/2011, 07/22/2015, 11/15/2016, 10/22/2017, 08/31/2018, 06/29/2019, 07/23/2020   PFIZER Comirnaty(Gray Top)Covid-19 Tri-Sucrose Vaccine 12/06/2020, 12/27/2020   Tdap 12/13/2016    Health Maintenance  Topic Date Due   MAMMOGRAM  09/01/2022   COVID-19 Vaccine (3 - Pfizer risk series) 06/21/2023 (Originally 01/24/2021)   INFLUENZA VACCINE  01/14/2024 (Originally 05/17/2023)   Hepatitis C Screening  06/04/2024 (Originally 04/11/1993)   HIV Screening  06/04/2024 (Originally 04/11/1990)   Fecal DNA (Cologuard)  04/22/2024   Medicare Annual Wellness (AWV)  06/04/2024   PAP SMEAR-Modifier  08/28/2025   DTaP/Tdap/Td (2 - Td or Tdap) 12/13/2026   HPV VACCINES  Aged Out       Assessment  This is a routine wellness examination for [employer].  Health Maintenance: Due or Overdue Health Maintenance Due  Topic Date Due   MAMMOGRAM  09/01/2022    Evelyn Sanchez does not need a referral for Community Assistance: Care Management:   no Social  Work:    no Prescription Assistance:  no Nutrition/Diabetes Education:  no   Plan:  Personalized Goals  Goals Addressed               This Visit's Progress     Patient Stated (pt-stated)        06/05/2023 AWV Goal: Exercise for General Health  Patient will verbalize understanding of the benefits of increased physical activity: Exercising regularly is important. It will improve your overall fitness, flexibility, and endurance. Regular exercise also will improve your overall health. It can help you control your weight,  reduce stress, and improve your bone density. Over the next year, patient will increase physical activity as tolerated with a goal of at least 150 minutes of moderate physical activity per week.  You can tell that you are exercising at a moderate intensity if your heart starts beating faster and you start breathing faster but can still hold a conversation. Moderate-intensity exercise ideas include: Walking 1 mile (1.6 km) in about 15 minutes Biking Hiking Golfing Dancing Water aerobics Patient will verbalize understanding of everyday activities that increase physical activity by providing examples like the following: Yard work, such as: Insurance underwriter Gardening Washing windows or floors Patient will be able to explain general safety guidelines for exercising:  Before you start a new exercise program, talk with your health care provider. Do not exercise so much that you hurt yourself, feel dizzy, or get very short of breath. Wear comfortable clothes and wear shoes with good support. Drink plenty of water while you exercise to prevent dehydration or heat stroke. Work out until your breathing and your heartbeat get faster.        Personalized Health Maintenance & Screening Recommendations  Influenza vaccine Screening mammography - scheduled  Lung Cancer Screening  Recommended: no (Low Dose CT Chest recommended if Age 51-80 years, 20 pack-year currently smoking OR have quit w/in past 15 years) Hepatitis C Screening recommended: no HIV Screening recommended: no  Advanced Directives: Written information was not prepared per patient's request.  Referrals & Orders No orders of the defined types were placed in this encounter.   Follow-up Plan Follow-up with Monica Becton, MD as planned Schedule influenza vaccine at the pharmacy or nurse visit in the office. Medicare wellness visit in one year.  Patient will access AVS on my chart.    I have personally reviewed and noted the following in the patient's chart:   Medical and social history Use of alcohol, tobacco or illicit drugs  Current medications and supplements Functional ability and status Nutritional status Physical activity Advanced directives List of other physicians Hospitalizations, surgeries, and ER visits in previous 12 months Vitals Screenings to include cognitive, depression, and falls Referrals and appointments  In addition, I have reviewed and discussed with Evelyn Sanchez certain preventive protocols, quality metrics, and best practice recommendations. A written personalized care plan for preventive services as well as general preventive health recommendations is available and can be mailed to the patient at her request.      Modesto Charon, RN BSN  06/05/2023

## 2023-06-13 ENCOUNTER — Ambulatory Visit (INDEPENDENT_AMBULATORY_CARE_PROVIDER_SITE_OTHER): Payer: 59

## 2023-06-13 DIAGNOSIS — Z01419 Encounter for gynecological examination (general) (routine) without abnormal findings: Secondary | ICD-10-CM

## 2023-06-13 DIAGNOSIS — Z1231 Encounter for screening mammogram for malignant neoplasm of breast: Secondary | ICD-10-CM | POA: Diagnosis not present

## 2023-07-02 DIAGNOSIS — Z471 Aftercare following joint replacement surgery: Secondary | ICD-10-CM | POA: Diagnosis not present

## 2023-07-02 DIAGNOSIS — Z96652 Presence of left artificial knee joint: Secondary | ICD-10-CM | POA: Diagnosis not present

## 2023-07-14 ENCOUNTER — Other Ambulatory Visit: Payer: Self-pay | Admitting: Sports Medicine

## 2023-07-20 ENCOUNTER — Encounter (INDEPENDENT_AMBULATORY_CARE_PROVIDER_SITE_OTHER): Payer: 59 | Admitting: Sports Medicine

## 2023-07-20 ENCOUNTER — Ambulatory Visit: Payer: 59 | Admitting: Sports Medicine

## 2023-07-20 ENCOUNTER — Telehealth: Payer: Self-pay | Admitting: Sports Medicine

## 2023-07-20 DIAGNOSIS — Z96652 Presence of left artificial knee joint: Secondary | ICD-10-CM

## 2023-07-20 NOTE — Telephone Encounter (Signed)

## 2023-07-20 NOTE — Telephone Encounter (Signed)
Evelyn Sanchez is a 47 year old female, she had a knee replacement, revision arthroplasty for infection, now with some patellar instability, her last surgery was done by Dr. Rolley Sims with [facility], it sounds like she saw them and had some patellar instability, the patella was stable on exam postop 6 months. Continue conservative treatment was recommended with the option for lateral capsular release. She is here asking me for a second opinion, I am happy to get 1, we will try [facility].

## 2023-08-09 DIAGNOSIS — Z79891 Long term (current) use of opiate analgesic: Secondary | ICD-10-CM | POA: Diagnosis not present

## 2023-08-09 DIAGNOSIS — M170 Bilateral primary osteoarthritis of knee: Secondary | ICD-10-CM | POA: Diagnosis not present

## 2023-08-09 DIAGNOSIS — G894 Chronic pain syndrome: Secondary | ICD-10-CM | POA: Diagnosis not present

## 2023-08-09 DIAGNOSIS — M25562 Pain in left knee: Secondary | ICD-10-CM | POA: Diagnosis not present

## 2023-09-12 NOTE — Progress Notes (Signed)
Pharmacy Quality Measure Review  This patient is appearing on a report for being at risk of failing the adherence measure for hypertension (ACEi/ARB) medications this calendar year.   Medication: lisinopril/hydrochlorothiazide 20/25mg  Last fill date: 09/04/23 for 90 day supply  Insurance report was not up to date. No action needed at this time.   Lenna Gilford, PharmD, DPLA

## 2023-09-19 ENCOUNTER — Encounter: Payer: Self-pay | Admitting: Sports Medicine

## 2023-09-19 ENCOUNTER — Telehealth: Payer: Self-pay | Admitting: Sports Medicine

## 2023-09-19 NOTE — Telephone Encounter (Signed)
Pt needs a authorization for a moblie scooter angel from [facility] is calling in to see what all patient needs to do in order to get the scooter are wheelchair

## 2023-09-25 NOTE — Telephone Encounter (Signed)
Authorization given, do they need anything other than a verbal order?

## 2023-09-26 NOTE — Telephone Encounter (Signed)
Called number given from message for [facility]. Was actually connected to [facility] [phone]. They checked patient insurance and they do not participate with [employer] dual complete. Called patient and informed. She will contact her insurance company to see who is contracted with her plan for DME supplies and will let us know what she finds out.

## 2023-11-09 DIAGNOSIS — Z5181 Encounter for therapeutic drug level monitoring: Secondary | ICD-10-CM | POA: Diagnosis not present

## 2023-11-09 DIAGNOSIS — M47816 Spondylosis without myelopathy or radiculopathy, lumbar region: Secondary | ICD-10-CM | POA: Diagnosis not present

## 2023-11-09 DIAGNOSIS — Z79891 Long term (current) use of opiate analgesic: Secondary | ICD-10-CM | POA: Diagnosis not present

## 2023-11-09 DIAGNOSIS — M25562 Pain in left knee: Secondary | ICD-10-CM | POA: Diagnosis not present

## 2023-11-09 DIAGNOSIS — G894 Chronic pain syndrome: Secondary | ICD-10-CM | POA: Diagnosis not present

## 2023-11-19 ENCOUNTER — Telehealth: Payer: Self-pay | Admitting: Sports Medicine

## 2023-11-19 DIAGNOSIS — Z20828 Contact with and (suspected) exposure to other viral communicable diseases: Secondary | ICD-10-CM | POA: Insufficient documentation

## 2023-11-19 MED ORDER — OSELTAMIVIR PHOSPHATE 75 MG PO CAPS
75.0000 mg | ORAL_CAPSULE | Freq: Every day | ORAL | 0 refills | Status: DC
Start: 2023-11-19 — End: 2024-06-12

## 2023-11-19 NOTE — Assessment & Plan Note (Signed)
Evelyn Sanchez has multiple household sick contacts, positive influenza, she is not having any symptoms so we will hit her with a prophylactic dose of Tamiflu.

## 2023-11-19 NOTE — Telephone Encounter (Signed)
Exposure to influenza Viviane has multiple household sick contacts, positive influenza, she is not having any symptoms so we will hit her with a prophylactic dose of Tamiflu.

## 2023-11-29 ENCOUNTER — Other Ambulatory Visit: Payer: Self-pay | Admitting: Sports Medicine

## 2023-11-29 DIAGNOSIS — I10 Essential (primary) hypertension: Secondary | ICD-10-CM

## 2023-12-19 DIAGNOSIS — M530 Cervicocranial syndrome: Secondary | ICD-10-CM | POA: Diagnosis not present

## 2023-12-19 DIAGNOSIS — M9913 Subluxation complex (vertebral) of lumbar region: Secondary | ICD-10-CM | POA: Diagnosis not present

## 2023-12-19 DIAGNOSIS — M9903 Segmental and somatic dysfunction of lumbar region: Secondary | ICD-10-CM | POA: Diagnosis not present

## 2023-12-19 DIAGNOSIS — M9901 Segmental and somatic dysfunction of cervical region: Secondary | ICD-10-CM | POA: Diagnosis not present

## 2023-12-19 DIAGNOSIS — M9902 Segmental and somatic dysfunction of thoracic region: Secondary | ICD-10-CM | POA: Diagnosis not present

## 2023-12-19 DIAGNOSIS — M9911 Subluxation complex (vertebral) of cervical region: Secondary | ICD-10-CM | POA: Diagnosis not present

## 2023-12-19 DIAGNOSIS — M9912 Subluxation complex (vertebral) of thoracic region: Secondary | ICD-10-CM | POA: Diagnosis not present

## 2024-01-01 DIAGNOSIS — Z96659 Presence of unspecified artificial knee joint: Secondary | ICD-10-CM | POA: Diagnosis not present

## 2024-01-01 DIAGNOSIS — Z471 Aftercare following joint replacement surgery: Secondary | ICD-10-CM | POA: Diagnosis not present

## 2024-01-01 DIAGNOSIS — T84018D Broken internal joint prosthesis, other site, subsequent encounter: Secondary | ICD-10-CM | POA: Diagnosis not present

## 2024-01-01 DIAGNOSIS — Z96652 Presence of left artificial knee joint: Secondary | ICD-10-CM | POA: Diagnosis not present

## 2024-01-03 ENCOUNTER — Other Ambulatory Visit: Payer: Self-pay | Admitting: Sports Medicine

## 2024-01-03 DIAGNOSIS — G43909 Migraine, unspecified, not intractable, without status migrainosus: Secondary | ICD-10-CM

## 2024-01-14 DIAGNOSIS — I10 Essential (primary) hypertension: Secondary | ICD-10-CM | POA: Diagnosis not present

## 2024-01-14 DIAGNOSIS — T8450XD Infection and inflammatory reaction due to unspecified internal joint prosthesis, subsequent encounter: Secondary | ICD-10-CM | POA: Diagnosis not present

## 2024-01-14 DIAGNOSIS — Z86718 Personal history of other venous thrombosis and embolism: Secondary | ICD-10-CM | POA: Diagnosis not present

## 2024-01-14 DIAGNOSIS — R7303 Prediabetes: Secondary | ICD-10-CM | POA: Diagnosis not present

## 2024-01-14 DIAGNOSIS — Z96652 Presence of left artificial knee joint: Secondary | ICD-10-CM | POA: Diagnosis not present

## 2024-01-15 ENCOUNTER — Encounter (INDEPENDENT_AMBULATORY_CARE_PROVIDER_SITE_OTHER): Admitting: Sports Medicine

## 2024-01-15 DIAGNOSIS — E119 Type 2 diabetes mellitus without complications: Secondary | ICD-10-CM | POA: Insufficient documentation

## 2024-01-15 NOTE — Telephone Encounter (Signed)

## 2024-01-17 DIAGNOSIS — T84013S Broken internal left knee prosthesis, sequela: Secondary | ICD-10-CM | POA: Diagnosis not present

## 2024-01-17 DIAGNOSIS — Z7409 Other reduced mobility: Secondary | ICD-10-CM | POA: Diagnosis not present

## 2024-01-17 DIAGNOSIS — M25662 Stiffness of left knee, not elsewhere classified: Secondary | ICD-10-CM | POA: Diagnosis not present

## 2024-01-21 ENCOUNTER — Telehealth (INDEPENDENT_AMBULATORY_CARE_PROVIDER_SITE_OTHER): Admitting: Sports Medicine

## 2024-01-21 ENCOUNTER — Encounter: Payer: Self-pay | Admitting: Sports Medicine

## 2024-01-21 DIAGNOSIS — E119 Type 2 diabetes mellitus without complications: Secondary | ICD-10-CM | POA: Diagnosis not present

## 2024-01-21 MED ORDER — MOUNJARO 2.5 MG/0.5ML ~~LOC~~ SOAJ
2.5000 mg | SUBCUTANEOUS | 0 refills | Status: DC
Start: 2024-01-21 — End: 2024-02-18

## 2024-01-21 MED ORDER — MOUNJARO 5 MG/0.5ML ~~LOC~~ SOAJ
5.0000 mg | SUBCUTANEOUS | 3 refills | Status: DC
Start: 2024-01-21 — End: 2024-02-26

## 2024-01-21 MED ORDER — METFORMIN HCL ER 500 MG PO TB24
500.0000 mg | ORAL_TABLET | Freq: Every day | ORAL | 3 refills | Status: DC
Start: 2024-01-21 — End: 2024-01-30

## 2024-01-21 NOTE — Progress Notes (Signed)
   Virtual Visit via WebEx/MyChart   I connected with  Evelyn Sanchez  on 01/21/24 via WebEx/MyChart/Doximity Video and verified that I am speaking with the correct person using two identifiers.   I discussed the limitations, risks, security and privacy concerns of performing an evaluation and management service by WebEx/MyChart/Doximity Video, including the higher likelihood of inaccurate diagnosis and treatment, and the availability of in person appointments.  We also discussed the likely need of an additional face to face encounter for complete and high quality delivery of care.  I also discussed with the patient that there may be a patient responsible charge related to this service. The patient expressed understanding and wishes to proceed.  Provider location is in medical facility. Patient location is at their home, different from provider location. People involved in care of the patient during this telehealth encounter were myself, my nurse/medical assistant, and my front office/scheduling team member.  Review of Systems: No fevers, chills, night sweats, weight loss, chest pain, or shortness of breath.   Objective Findings:    General: Speaking full sentences, no audible heavy breathing.  Sounds alert and appropriately interactive.  Appears well.  Face symmetric.  Extraocular movements intact.  Pupils equal and round.  No nasal flaring or accessory muscle use visualized.  Independent interpretation of tests performed by another provider:   None.  Brief History, Exam, Impression, and Recommendations:    Controlled type 2 diabetes mellitus without complication, without long-term current use of insulin (HCC) New diagnosis type 2 diabetes mellitus, we discussed the pathophysiology, the implications with screening and potential treatments.  Is currently already doing a low carbohydrate diet. We also discussed medications to help control diabetes. We will start with metformin Mounjaro, she  will likely lose a good amount of weight with Mounjaro. She will need a diabetic eye exam and urine microalbumin creatinine ratio, ordering all of the above, I like to see her back in 3 months for an A1c check.   I discussed the above assessment and treatment plan with the patient. The patient was provided an opportunity to ask questions and all were answered. The patient agreed with the plan and demonstrated an understanding of the instructions.   The patient was advised to call back or seek an in-person evaluation if the symptoms worsen or if the condition fails to improve as anticipated.   I provided 30 minutes of face to face and non-face-to-face time during this encounter date, time was needed to gather information, review chart, records, communicate/coordinate with staff remotely, as well as complete documentation.   ____________________________________________ Ihor Austin. Benjamin Stain, M.D., ABFM., CAQSM., AME. Primary Care and Sports Medicine [facility]  Adjunct Professor of Family Medicine  [employer]  Restaurant manager, fast food

## 2024-01-21 NOTE — Assessment & Plan Note (Signed)
 New diagnosis type 2 diabetes mellitus, we discussed the pathophysiology, the implications with screening and potential treatments.  Is currently already doing a low carbohydrate diet. We also discussed medications to help control diabetes. We will start with metformin Mounjaro, she will likely lose a good amount of weight with Mounjaro. She will need a diabetic eye exam and urine microalbumin creatinine ratio, ordering all of the above, I like to see her back in 3 months for an A1c check.

## 2024-01-29 ENCOUNTER — Encounter (INDEPENDENT_AMBULATORY_CARE_PROVIDER_SITE_OTHER): Payer: Self-pay | Admitting: Sports Medicine

## 2024-01-29 DIAGNOSIS — E119 Type 2 diabetes mellitus without complications: Secondary | ICD-10-CM | POA: Diagnosis not present

## 2024-01-30 MED ORDER — ONDANSETRON 8 MG PO TBDP: 8.0000 mg | ORAL_TABLET | Freq: Three times a day (TID) | ORAL | 3 refills | Status: DC | PRN

## 2024-01-30 NOTE — Telephone Encounter (Signed)

## 2024-02-03 ENCOUNTER — Other Ambulatory Visit: Payer: Self-pay | Admitting: Sports Medicine

## 2024-02-03 DIAGNOSIS — I10 Essential (primary) hypertension: Secondary | ICD-10-CM

## 2024-02-06 DIAGNOSIS — Z79899 Other long term (current) drug therapy: Secondary | ICD-10-CM | POA: Diagnosis not present

## 2024-02-06 DIAGNOSIS — E119 Type 2 diabetes mellitus without complications: Secondary | ICD-10-CM | POA: Diagnosis not present

## 2024-02-06 DIAGNOSIS — G43909 Migraine, unspecified, not intractable, without status migrainosus: Secondary | ICD-10-CM | POA: Diagnosis not present

## 2024-02-06 DIAGNOSIS — I10 Essential (primary) hypertension: Secondary | ICD-10-CM | POA: Diagnosis not present

## 2024-02-06 DIAGNOSIS — G4733 Obstructive sleep apnea (adult) (pediatric): Secondary | ICD-10-CM | POA: Diagnosis not present

## 2024-02-06 DIAGNOSIS — Z471 Aftercare following joint replacement surgery: Secondary | ICD-10-CM | POA: Diagnosis not present

## 2024-02-06 DIAGNOSIS — R079 Chest pain, unspecified: Secondary | ICD-10-CM | POA: Diagnosis not present

## 2024-02-06 DIAGNOSIS — Z9181 History of falling: Secondary | ICD-10-CM | POA: Diagnosis not present

## 2024-02-06 DIAGNOSIS — T84023A Instability of internal left knee prosthesis, initial encounter: Secondary | ICD-10-CM | POA: Diagnosis not present

## 2024-02-06 DIAGNOSIS — G894 Chronic pain syndrome: Secondary | ICD-10-CM | POA: Diagnosis not present

## 2024-02-06 DIAGNOSIS — G8918 Other acute postprocedural pain: Secondary | ICD-10-CM | POA: Diagnosis not present

## 2024-02-06 DIAGNOSIS — Z86711 Personal history of pulmonary embolism: Secondary | ICD-10-CM | POA: Diagnosis not present

## 2024-02-06 DIAGNOSIS — E1169 Type 2 diabetes mellitus with other specified complication: Secondary | ICD-10-CM | POA: Diagnosis not present

## 2024-02-06 DIAGNOSIS — Z7984 Long term (current) use of oral hypoglycemic drugs: Secondary | ICD-10-CM | POA: Diagnosis not present

## 2024-02-06 DIAGNOSIS — T84018A Broken internal joint prosthesis, other site, initial encounter: Secondary | ICD-10-CM | POA: Diagnosis not present

## 2024-02-06 DIAGNOSIS — R0789 Other chest pain: Secondary | ICD-10-CM | POA: Diagnosis not present

## 2024-02-06 DIAGNOSIS — Z96652 Presence of left artificial knee joint: Secondary | ICD-10-CM | POA: Diagnosis not present

## 2024-02-07 ENCOUNTER — Encounter (INDEPENDENT_AMBULATORY_CARE_PROVIDER_SITE_OTHER): Payer: Self-pay | Admitting: Sports Medicine

## 2024-02-07 DIAGNOSIS — E119 Type 2 diabetes mellitus without complications: Secondary | ICD-10-CM

## 2024-02-07 LAB — COMPREHENSIVE METABOLIC PANEL WITH GFR: eGFR: 72

## 2024-02-07 NOTE — Telephone Encounter (Signed)

## 2024-02-11 DIAGNOSIS — Z7409 Other reduced mobility: Secondary | ICD-10-CM | POA: Diagnosis not present

## 2024-02-11 DIAGNOSIS — T84013S Broken internal left knee prosthesis, sequela: Secondary | ICD-10-CM | POA: Diagnosis not present

## 2024-02-11 DIAGNOSIS — R079 Chest pain, unspecified: Secondary | ICD-10-CM | POA: Diagnosis not present

## 2024-02-11 DIAGNOSIS — M25662 Stiffness of left knee, not elsewhere classified: Secondary | ICD-10-CM | POA: Diagnosis not present

## 2024-02-11 DIAGNOSIS — R000 Tachycardia, unspecified: Secondary | ICD-10-CM | POA: Diagnosis not present

## 2024-02-12 DIAGNOSIS — Z79891 Long term (current) use of opiate analgesic: Secondary | ICD-10-CM | POA: Diagnosis not present

## 2024-02-12 DIAGNOSIS — M7989 Other specified soft tissue disorders: Secondary | ICD-10-CM | POA: Diagnosis not present

## 2024-02-12 DIAGNOSIS — Z96652 Presence of left artificial knee joint: Secondary | ICD-10-CM | POA: Diagnosis not present

## 2024-02-12 DIAGNOSIS — G8918 Other acute postprocedural pain: Secondary | ICD-10-CM | POA: Diagnosis not present

## 2024-02-13 DIAGNOSIS — M25562 Pain in left knee: Secondary | ICD-10-CM | POA: Diagnosis not present

## 2024-02-13 DIAGNOSIS — Z96652 Presence of left artificial knee joint: Secondary | ICD-10-CM | POA: Diagnosis not present

## 2024-02-17 ENCOUNTER — Other Ambulatory Visit: Payer: Self-pay | Admitting: Sports Medicine

## 2024-02-17 DIAGNOSIS — E119 Type 2 diabetes mellitus without complications: Secondary | ICD-10-CM

## 2024-02-18 DIAGNOSIS — T8130XA Disruption of wound, unspecified, initial encounter: Secondary | ICD-10-CM | POA: Diagnosis not present

## 2024-02-18 DIAGNOSIS — Z96652 Presence of left artificial knee joint: Secondary | ICD-10-CM | POA: Diagnosis not present

## 2024-02-19 ENCOUNTER — Ambulatory Visit: Payer: Self-pay

## 2024-02-19 NOTE — Telephone Encounter (Signed)
 This request has been handled. No further action is required. Patient verbalized understanding.

## 2024-02-19 NOTE — Telephone Encounter (Signed)
 For now hydrate aggressively, blood pressure is not abnormal particularly in the absence of symptoms.

## 2024-02-19 NOTE — Telephone Encounter (Signed)
  Chief Complaint: Low Blood pressure readings Symptoms: 100/70 yesterday is surgeon's office but no other symptoms Frequency: last two weeks Pertinent Negatives: Patient denies fever, CP, SOB Disposition: [] ED /[] Urgent Care (no appt availability in office) / [x] Appointment(In office/virtual)/ []  [facility]/ [] Home Care/ [] Refused Recommended Disposition /[] [facility]/ []  Follow-up with PCP Additional Notes: patient calling with concerns for low blood pressure readings. Patient reports BP of 100/70 yesterday in surgeon's office. Reports no BP meds taken since surgery on 4/23. Patient was instructed to follow up with PCP. Call was transferred to CAL for scheduling.    Copied from CRM [id number]. Topic: Clinical - Red Word Triage >> Feb 19, 2024 10:26 AM Danelle Dunning F wrote: [facility] that prompted transfer to Nurse Triage:  Low blood pressure reading for about two weeks 100/70 Reason for Disposition  [1] Systolic BP 90-110 AND [2] taking blood pressure medications AND [3] NOT dizzy, lightheaded or weak  Answer Assessment - Initial Assessment Questions 1. BLOOD PRESSURE: "What is the blood pressure?" "Did you take at least two measurements 5 minutes apart?"     100/70 2. ONSET: "When did you take your blood pressure?"     Taken yesterday in doctor's office  3. HOW: "How did you obtain the blood pressure?" (e.g., visiting nurse, automatic home BP monitor)     Nurse in office 4. HISTORY: "Do you have a history of low blood pressure?" "What is your blood pressure normally?"     no 5. MEDICINES: "Are you taking any medications for blood pressure?" If Yes, ask: "Have they been changed recently?"     Hx of high blood pressure-but hasn't taken blood pressure medication since 02/06/2024 6. PULSE RATE: "Do you know what your pulse rate is?"      Unable to tell me 7. OTHER SYMPTOMS: "Have you been sick recently?" "Have you had a recent injury?"     Had knee surgery on  02/06/2024  Protocols used: Blood Pressure - Low-A-AH

## 2024-02-19 NOTE — Telephone Encounter (Signed)
 Patient is scheduled to see the provider on 02/26/24.

## 2024-02-25 DIAGNOSIS — M25662 Stiffness of left knee, not elsewhere classified: Secondary | ICD-10-CM | POA: Diagnosis not present

## 2024-02-25 DIAGNOSIS — T84013S Broken internal left knee prosthesis, sequela: Secondary | ICD-10-CM | POA: Diagnosis not present

## 2024-02-25 DIAGNOSIS — Z96659 Presence of unspecified artificial knee joint: Secondary | ICD-10-CM | POA: Diagnosis not present

## 2024-02-25 DIAGNOSIS — T84018D Broken internal joint prosthesis, other site, subsequent encounter: Secondary | ICD-10-CM | POA: Diagnosis not present

## 2024-02-25 DIAGNOSIS — Z7409 Other reduced mobility: Secondary | ICD-10-CM | POA: Diagnosis not present

## 2024-02-25 DIAGNOSIS — Z96652 Presence of left artificial knee joint: Secondary | ICD-10-CM | POA: Diagnosis not present

## 2024-02-26 ENCOUNTER — Ambulatory Visit

## 2024-02-26 ENCOUNTER — Ambulatory Visit (INDEPENDENT_AMBULATORY_CARE_PROVIDER_SITE_OTHER): Admitting: Sports Medicine

## 2024-02-26 ENCOUNTER — Ambulatory Visit: Payer: Self-pay | Admitting: Sports Medicine

## 2024-02-26 VITALS — BP 145/86 | HR 66

## 2024-02-26 DIAGNOSIS — R011 Cardiac murmur, unspecified: Secondary | ICD-10-CM | POA: Diagnosis not present

## 2024-02-26 DIAGNOSIS — R6883 Chills (without fever): Secondary | ICD-10-CM

## 2024-02-26 DIAGNOSIS — I10 Essential (primary) hypertension: Secondary | ICD-10-CM | POA: Diagnosis not present

## 2024-02-26 MED ORDER — BLOOD GLUCOSE TEST VI STRP: 1.0000 | ORAL_STRIP | Freq: Three times a day (TID) | 0 refills | Status: DC

## 2024-02-26 MED ORDER — LANCET DEVICE MISC: 1.0000 | Freq: Three times a day (TID) | 0 refills | Status: AC

## 2024-02-26 MED ORDER — VALSARTAN 80 MG PO TABS: 80.0000 mg | ORAL_TABLET | Freq: Every day | ORAL | 3 refills | Status: DC

## 2024-02-26 MED ORDER — LANCETS MISC. MISC: 1.0000 | Freq: Three times a day (TID) | 0 refills | Status: AC

## 2024-02-26 MED ORDER — BLOOD GLUCOSE MONITORING SUPPL DEVI: 1.0000 | Freq: Three times a day (TID) | 0 refills | Status: DC

## 2024-02-26 NOTE — Progress Notes (Signed)
    Procedures performed today:    None.  Independent interpretation of notes and tests performed by another provider:   None.  Brief History, Exam, Impression, and Recommendations:    Chills Pleasant 48 year old female, she had revision arthroplasty for infection about 3 weeks ago, since then she has had cold and sensitivity in the hospital, dizziness, weakness, nausea. Her blood pressure was noted low, 100/60 in the hospital, her blood pressure medication was stopped, it is now in the 150s systolic and she continues to have similar symptoms. The symptoms are nonspecific. On exam her knee does feel warm but she is recently postop. Lungs are clear. She does have a systolic murmur that I do not remember hearing before. We will check some labs including CBC, CMP, iron indices, TSH, I would like blood cultures, urinalysis with microalbumin, urine culture. Chest x-ray. Due to the murmur I would like an updated echocardiogram. She is currently taking antibiotics and can continue these. If she develops severe worsening in symptoms her husband understands to get a blood pressure, pulse rate and check her blood sugar, calling in glucometer.  Benign essential hypertension Lisinopril /HCTZ and amlodipine  were discontinued in the hospitalization, we will start back slowly, we will just do valsartan this time and monitor blood pressures.    ____________________________________________ Joselyn Nicely. Sandy Crumb, M.D., ABFM., CAQSM., AME. Primary Care and Sports Medicine [facility]  Adjunct Professor of [facility] Medicine  [employer]  Restaurant manager, fast food

## 2024-02-26 NOTE — Assessment & Plan Note (Signed)
 Lisinopril /HCTZ and amlodipine  were discontinued in the hospitalization, we will start back slowly, we will just do valsartan this time and monitor blood pressures.

## 2024-02-26 NOTE — Assessment & Plan Note (Addendum)
 Pleasant 48 year old female, she had revision arthroplasty for infection about 3 weeks ago, since then she has had cold and sensitivity in the hospital, dizziness, weakness, nausea. Her blood pressure was noted low, 100/60 in the hospital, her blood pressure medication was stopped, it is now in the 150s systolic and she continues to have similar symptoms. The symptoms are nonspecific. On exam her knee does feel warm but she is recently postop. Lungs are clear. She does have a systolic murmur that I do not remember hearing before. We will check some labs including CBC, CMP, iron indices, TSH, I would like blood cultures, urinalysis with microalbumin, urine culture. Chest x-ray. Due to the murmur I would like an updated echocardiogram. She is currently taking antibiotics and can continue these. If she develops severe worsening in symptoms her husband understands to get a blood pressure, pulse rate and check her blood sugar, calling in glucometer.

## 2024-02-27 ENCOUNTER — Other Ambulatory Visit: Payer: Self-pay | Admitting: Sports Medicine

## 2024-02-27 DIAGNOSIS — F331 Major depressive disorder, recurrent, moderate: Secondary | ICD-10-CM

## 2024-02-27 LAB — CBC WITH DIFFERENTIAL/PLATELET
Basophils Absolute: 0 10*3/uL (ref 0.0–0.2)
Basos: 1 %
EOS (ABSOLUTE): 0.1 10*3/uL (ref 0.0–0.4)
Eos: 2 %
Hematocrit: 41.2 % (ref 34.0–46.6)
Hemoglobin: 12.8 g/dL (ref 11.1–15.9)
Immature Grans (Abs): 0 10*3/uL (ref 0.0–0.1)
Immature Granulocytes: 0 %
Lymphocytes Absolute: 1.4 10*3/uL (ref 0.7–3.1)
Lymphs: 23 %
MCH: 26.7 pg (ref 26.6–33.0)
MCHC: 31.1 g/dL — ABNORMAL LOW (ref 31.5–35.7)
MCV: 86 fL (ref 79–97)
Monocytes Absolute: 0.3 10*3/uL (ref 0.1–0.9)
Monocytes: 5 %
Neutrophils Absolute: 4.4 10*3/uL (ref 1.4–7.0)
Neutrophils: 69 %
Platelets: 411 10*3/uL (ref 150–450)
RBC: 4.79 x10E6/uL (ref 3.77–5.28)
RDW: 13.4 % (ref 11.7–15.4)
WBC: 6.3 10*3/uL (ref 3.4–10.8)

## 2024-02-27 LAB — MICROALBUMIN / CREATININE URINE RATIO
Creatinine, Urine: 42 mg/dL
Microalb/Creat Ratio: <7 mg/g{creat} (ref 0–29)
Microalbumin, Urine: <3 ug/mL

## 2024-02-27 LAB — IRON,TIBC AND FERRITIN PANEL
Ferritin: 111 ng/mL (ref 15–150)
Iron Saturation: 15 % (ref 15–55)
Iron: 56 ug/dL (ref 27–159)
Total Iron Binding Capacity: 374 ug/dL (ref 250–450)
UIBC: 318 ug/dL (ref 131–425)

## 2024-02-27 LAB — COMPREHENSIVE METABOLIC PANEL WITH GFR
ALT: 17 IU/L (ref 0–32)
AST: 20 IU/L (ref 0–40)
Albumin: 4.9 g/dL (ref 3.9–4.9)
Alkaline Phosphatase: 102 IU/L (ref 44–121)
BUN/Creatinine Ratio: 19 (ref 9–23)
BUN: 18 mg/dL (ref 6–24)
Bilirubin Total: 0.4 mg/dL (ref 0.0–1.2)
CO2: 18 mmol/L — ABNORMAL LOW (ref 20–29)
Calcium: 10.4 mg/dL — ABNORMAL HIGH (ref 8.7–10.2)
Chloride: 103 mmol/L (ref 96–106)
Creatinine, Ser: 0.95 mg/dL (ref 0.57–1.00)
Globulin, Total: 2.6 g/dL (ref 1.5–4.5)
Glucose: 126 mg/dL — ABNORMAL HIGH (ref 70–99)
Potassium: 4.7 mmol/L (ref 3.5–5.2)
Sodium: 139 mmol/L (ref 134–144)
Total Protein: 7.5 g/dL (ref 6.0–8.5)
eGFR: 74 mL/min/{1.73_m2} (ref >59)

## 2024-02-27 LAB — MICROSCOPIC EXAMINATION
Bacteria, UA: NONE SEEN
Casts: NONE SEEN /LPF
RBC, Urine: NONE SEEN /HPF (ref 0–2)
WBC, UA: NONE SEEN /HPF (ref 0–5)

## 2024-02-27 LAB — UA/M W/RFLX CULTURE, ROUTINE
Bilirubin, UA: NEGATIVE
Glucose, UA: NEGATIVE
Ketones, UA: NEGATIVE
Leukocytes,UA: NEGATIVE
Nitrite, UA: NEGATIVE
Protein,UA: NEGATIVE
RBC, UA: NEGATIVE
Specific Gravity, UA: 1.011 (ref 1.005–1.030)
Urobilinogen, Ur: 0.2 mg/dL (ref 0.2–1.0)
pH, UA: 6 (ref 5.0–7.5)

## 2024-02-27 LAB — TSH: TSH: 1.26 u[IU]/mL (ref 0.450–4.500)

## 2024-02-27 LAB — B12 AND FOLATE PANEL
Folate: 11.4 ng/mL (ref >3.0)
Vitamin B-12: 603 pg/mL (ref 232–1245)

## 2024-02-28 DIAGNOSIS — T84013S Broken internal left knee prosthesis, sequela: Secondary | ICD-10-CM | POA: Diagnosis not present

## 2024-02-28 DIAGNOSIS — Z96659 Presence of unspecified artificial knee joint: Secondary | ICD-10-CM | POA: Diagnosis not present

## 2024-02-28 DIAGNOSIS — Z7409 Other reduced mobility: Secondary | ICD-10-CM | POA: Diagnosis not present

## 2024-02-28 DIAGNOSIS — Z96652 Presence of left artificial knee joint: Secondary | ICD-10-CM | POA: Diagnosis not present

## 2024-02-28 DIAGNOSIS — T84018D Broken internal joint prosthesis, other site, subsequent encounter: Secondary | ICD-10-CM | POA: Diagnosis not present

## 2024-02-28 DIAGNOSIS — M25662 Stiffness of left knee, not elsewhere classified: Secondary | ICD-10-CM | POA: Diagnosis not present

## 2024-03-03 LAB — CULTURE, BLOOD (SINGLE)

## 2024-03-06 DIAGNOSIS — M25662 Stiffness of left knee, not elsewhere classified: Secondary | ICD-10-CM | POA: Diagnosis not present

## 2024-03-06 DIAGNOSIS — T84013S Broken internal left knee prosthesis, sequela: Secondary | ICD-10-CM | POA: Diagnosis not present

## 2024-03-06 DIAGNOSIS — Z7409 Other reduced mobility: Secondary | ICD-10-CM | POA: Diagnosis not present

## 2024-03-06 DIAGNOSIS — Z96652 Presence of left artificial knee joint: Secondary | ICD-10-CM | POA: Diagnosis not present

## 2024-03-06 DIAGNOSIS — Z96659 Presence of unspecified artificial knee joint: Secondary | ICD-10-CM | POA: Diagnosis not present

## 2024-03-06 DIAGNOSIS — T84018D Broken internal joint prosthesis, other site, subsequent encounter: Secondary | ICD-10-CM | POA: Diagnosis not present

## 2024-03-11 ENCOUNTER — Ambulatory Visit: Admitting: Sports Medicine

## 2024-03-13 ENCOUNTER — Encounter: Payer: Self-pay | Admitting: Sports Medicine

## 2024-03-13 ENCOUNTER — Ambulatory Visit (INDEPENDENT_AMBULATORY_CARE_PROVIDER_SITE_OTHER): Admitting: Sports Medicine

## 2024-03-13 VITALS — BP 134/74 | HR 73 | Resp 20 | Ht 64.0 in

## 2024-03-13 DIAGNOSIS — R6883 Chills (without fever): Secondary | ICD-10-CM

## 2024-03-13 NOTE — Assessment & Plan Note (Signed)
 Infectious workup mostly negative, still awaiting echo as she did have some murmurs. Symptoms improved, we will watch this for now.

## 2024-03-13 NOTE — Progress Notes (Signed)
    Procedures performed today:    None.  Independent interpretation of notes and tests performed by another provider:   None.  Brief History, Exam, Impression, and Recommendations:    [place]ills Infectious workup mostly negative, still awaiting echo as she did have some murmurs. Symptoms improved, we will watch this for now.    ____________________________________________ Joselyn Nicely. Sandy Crumb, M.D., ABFM., CAQSM., AME. Primary Care and Sports Medicine [facility]  Adjunct Professor of [facility] Medicine  [employer]  Restaurant manager, fast food

## 2024-03-17 DIAGNOSIS — Z7409 Other reduced mobility: Secondary | ICD-10-CM | POA: Diagnosis not present

## 2024-03-17 DIAGNOSIS — M25662 Stiffness of left knee, not elsewhere classified: Secondary | ICD-10-CM | POA: Diagnosis not present

## 2024-03-17 DIAGNOSIS — Z96659 Presence of unspecified artificial knee joint: Secondary | ICD-10-CM | POA: Diagnosis not present

## 2024-03-17 DIAGNOSIS — G8929 Other chronic pain: Secondary | ICD-10-CM | POA: Diagnosis not present

## 2024-03-17 DIAGNOSIS — T84013S Broken internal left knee prosthesis, sequela: Secondary | ICD-10-CM | POA: Diagnosis not present

## 2024-03-17 DIAGNOSIS — T84018D Broken internal joint prosthesis, other site, subsequent encounter: Secondary | ICD-10-CM | POA: Diagnosis not present

## 2024-03-17 DIAGNOSIS — Z96652 Presence of left artificial knee joint: Secondary | ICD-10-CM | POA: Diagnosis not present

## 2024-03-17 DIAGNOSIS — M25562 Pain in left knee: Secondary | ICD-10-CM | POA: Diagnosis not present

## 2024-03-21 DIAGNOSIS — Z471 Aftercare following joint replacement surgery: Secondary | ICD-10-CM | POA: Diagnosis not present

## 2024-03-21 DIAGNOSIS — Z96652 Presence of left artificial knee joint: Secondary | ICD-10-CM | POA: Diagnosis not present

## 2024-03-30 ENCOUNTER — Other Ambulatory Visit: Payer: Self-pay | Admitting: Sports Medicine

## 2024-03-30 DIAGNOSIS — R6883 Chills (without fever): Secondary | ICD-10-CM

## 2024-04-08 ENCOUNTER — Ambulatory Visit (HOSPITAL_BASED_OUTPATIENT_CLINIC_OR_DEPARTMENT_OTHER)
Admission: RE | Admit: 2024-04-08 | Discharge: 2024-04-08 | Disposition: A | Discharge status: Home / Self Care | Source: Ambulatory Visit | Attending: Sports Medicine | Admitting: Sports Medicine

## 2024-04-08 DIAGNOSIS — R011 Cardiac murmur, unspecified: Secondary | ICD-10-CM | POA: Insufficient documentation

## 2024-04-08 LAB — ECHOCARDIOGRAM COMPLETE
AR max vel: 3.09 cm2
AV Area VTI: 2.98 cm2
AV Area mean vel: 3.11 cm2
AV Mean grad: 3.5 mmHg
AV Peak grad: 6.2 mmHg
Ao pk vel: 1.25 m/s
Area-P 1/2: 4.36 cm2
Calc EF: 63.4 %
S' Lateral: 3 cm
Single Plane A2C EF: 66.8 %
Single Plane A4C EF: 59.5 %

## 2024-04-29 ENCOUNTER — Other Ambulatory Visit: Payer: Self-pay | Admitting: Sports Medicine

## 2024-04-29 DIAGNOSIS — Z1231 Encounter for screening mammogram for malignant neoplasm of breast: Secondary | ICD-10-CM

## 2024-05-01 ENCOUNTER — Other Ambulatory Visit: Payer: Self-pay | Admitting: Sports Medicine

## 2024-05-01 DIAGNOSIS — R6883 Chills (without fever): Secondary | ICD-10-CM

## 2024-05-06 DIAGNOSIS — G894 Chronic pain syndrome: Secondary | ICD-10-CM | POA: Diagnosis not present

## 2024-05-06 DIAGNOSIS — M25562 Pain in left knee: Secondary | ICD-10-CM | POA: Diagnosis not present

## 2024-05-06 DIAGNOSIS — Z5181 Encounter for therapeutic drug level monitoring: Secondary | ICD-10-CM | POA: Diagnosis not present

## 2024-05-06 DIAGNOSIS — M47816 Spondylosis without myelopathy or radiculopathy, lumbar region: Secondary | ICD-10-CM | POA: Diagnosis not present

## 2024-05-06 DIAGNOSIS — M170 Bilateral primary osteoarthritis of knee: Secondary | ICD-10-CM | POA: Diagnosis not present

## 2024-05-06 DIAGNOSIS — Z79891 Long term (current) use of opiate analgesic: Secondary | ICD-10-CM | POA: Diagnosis not present

## 2024-05-14 ENCOUNTER — Other Ambulatory Visit: Payer: Self-pay | Admitting: Sports Medicine

## 2024-05-14 DIAGNOSIS — I10 Essential (primary) hypertension: Secondary | ICD-10-CM

## 2024-05-30 ENCOUNTER — Other Ambulatory Visit: Payer: Self-pay | Admitting: Medical Genetics

## 2024-05-31 ENCOUNTER — Other Ambulatory Visit: Payer: Self-pay | Admitting: Sports Medicine

## 2024-05-31 DIAGNOSIS — E119 Type 2 diabetes mellitus without complications: Secondary | ICD-10-CM

## 2024-06-03 DIAGNOSIS — Z792 Long term (current) use of antibiotics: Secondary | ICD-10-CM | POA: Diagnosis not present

## 2024-06-03 DIAGNOSIS — T8454XD Infection and inflammatory reaction due to internal left knee prosthesis, subsequent encounter: Secondary | ICD-10-CM | POA: Diagnosis not present

## 2024-06-03 DIAGNOSIS — Z969 Presence of functional implant, unspecified: Secondary | ICD-10-CM | POA: Diagnosis not present

## 2024-06-03 DIAGNOSIS — B957 Other staphylococcus as the cause of diseases classified elsewhere: Secondary | ICD-10-CM | POA: Diagnosis not present

## 2024-06-12 ENCOUNTER — Ambulatory Visit

## 2024-06-12 VITALS — Ht 63.0 in | Wt 185.0 lb

## 2024-06-12 DIAGNOSIS — Z0000 Encounter for general adult medical examination without abnormal findings: Secondary | ICD-10-CM | POA: Diagnosis not present

## 2024-06-12 NOTE — Patient Instructions (Signed)
 Ms. Dadisman , Thank you for taking time to come for your Medicare Wellness Visit. I appreciate your ongoing commitment to your health goals. Please review the following plan we discussed and let me know if I can assist you in the future.   These are the goals we discussed:  Goals       LCSW called client phone number today but was not able to speak via phone with client. LCSW left phone message for client requesting she call LCSW at [phone]      Interventions:   LCSW called client phone number today but was not able to speak via phone with client. LCSW did leave phone message for client requesting she call LCSW at [phone].      LCSW spoke via phone today with Barkley Sheela, spouse of client, about client needs. Barkley said client has knee pain issue      Intervention:   LCSW spoke via phone today with Barkley Sheela, spouse of client, about client needs. Barkley said client has knee pain issue. Barkley said client is scheduled for medical appointment with medical provider to assess her knee care issue Barkley and LCSW discussed program support services with RN, LCSW and Pharmacist LCSW spoke with Barkley Sheela about client support with PCP Reviewed client medication procurement. Reviewed client appetite. Client has no transport needs Encouraged client to discuss Care Coordination Program support with PCP. Encouraged client to call LCSW as needed for SW support at [phone].       LCSW spoke via phone today with Barkley Curlee Sheela Mickey, son of client, about client needs.      Interventions: LCSW spoke via phone today with Barkley Curlee Sheela Mickey, son of client, about client needs Discussed recent client procedure on her knee. Son of client said client did have recent knee procedure. Family support for client is from her spouse, from her son, and from her daughter.  LCSW has previously spoken with spouse, son and daughter about Care Coordination program support for Jameson Morrow LCSW has left message for  client requesting call back.  Son of client said he would convey information to client today and see if she could return call soon to LCSW to discuss her current needs and status LCSW thanked Barkley Curlee Sheela, Mickey, for phone call with LCSW today      LCSW spoke with Keitha Sheela, daughter of client today and gave Allessandra information about program services      Intervention: LCSW spoke with Keitha Sheela daughter of client today and gave Keitha information about program services Encouraged client to talk with PCP about program services. Talked with Alessandra about RN support with program. Pharmacy support of program and LCSW support in program. Talked with Alessandra about client support with PCP Keitha said she would convey program information to client. Client will talk with PCP about program if client is interested in participating in program      Patient and LCSW spoke today about program support. Patient may be interested in program support.      Interventions Patient and LCSW spoke today about program support. Patient may be interested in program support Client said she is currently having breathing issues. She uses nebulizer as prescribed to help with breathing. She also said she has inhaler she uses to help with breathing . She said she has a history of bronchitis. She has called PCP office today and is awaiting return call from nurse at PCP office.  LCSW spoke with Richardson about RN  support and Pharmacy support with Care Coordination program. LCSW encouraged client to continue calling PCP office today until she gets response. Client did have frequent cough during phone call with LCSW. Client will keep trying to reach nurse at her PCP office to ask for care suggestions for client       patient had knee procedure on January 04, 2023. She is trying to start physical therapy sessions. (Outpatient). She is dealing with knee pain issues (pt-stated)      Interventions:  LCSW spoke  via phone with patient today about patient needs. She said she had knee procedure on January 04, 2023. She has been having fluid buildup on knee recently. She is scheduled for appointment this Thursday for knee exam. She said medical provider may have to pull fluid from her knee.  She said knee is swollen and she has had more difficulty wlaking last few days.  She plans to go to appointment this Thursday for treatment to swollen knee. Discussed medication procurement. Discussed sleep issues. She said she has had some sleep difficulty Discussed transport needs. Her family provides transport support for client.  Discussed family support with spouse, son and daughter. Discussed program support with RN, LCSW and Pharmacist Encouraged client to call LCSW for SW support as needed at [phone].      Patient is recovering from past knee surgery. she goes to gym to exercise      Interventions:  LCSW spoke with Barkley Breed, spouse of client, about client needs Discussed sleeping issues of client. Client has difficulty sleeping Discussed medication procurement Reminded Barkley of program support for client with RN, LCSW and Pharmacist Discussed ambulation of client Korianna Washer said client is now finished with physical therapy sessions. Barkley said that  client goes to gym regularly to exercise Reviewed pain issues of client Encouraged client or Lizabeth Fellner to call LCSW as needed for SW support for client at [phone] Barkley was appreciative of call from LCSW today      Patient Stated (pt-stated)      08/08/2021 AWV Goal: Exercise for General Health  Patient will verbalize understanding of the benefits of increased physical activity: Exercising regularly is important. It will improve your overall fitness, flexibility, and endurance. Regular exercise also will improve your overall health. It can help you control your weight, reduce stress, and improve your bone density. Over the next year, patient will  increase physical activity as tolerated with a goal of at least 150 minutes of moderate physical activity per week.  You can tell that you are exercising at a moderate intensity if your heart starts beating faster and you start breathing faster but can still hold a conversation. Moderate-intensity exercise ideas include: Walking 1 mile (1.6 km) in about 15 minutes Biking Hiking Golfing Dancing Water aerobics Patient will verbalize understanding of everyday activities that increase physical activity by providing examples like the following: Yard work, such as: Insurance underwriter Gardening Washing windows or floors Patient will be able to explain general safety guidelines for exercising:  Before you start a new exercise program, talk with your health care provider. Do not exercise so much that you hurt yourself, feel dizzy, or get very short of breath. Wear comfortable clothes and wear shoes with good support. Drink plenty of water while you exercise to prevent dehydration or heat stroke. Work out until your breathing and your heartbeat get faster.  Patient Stated (pt-stated)      06/05/2023 AWV Goal: Exercise for General Health  Patient will verbalize understanding of the benefits of increased physical activity: Exercising regularly is important. It will improve your overall fitness, flexibility, and endurance. Regular exercise also will improve your overall health. It can help you control your weight, reduce stress, and improve your bone density. Over the next year, patient will increase physical activity as tolerated with a goal of at least 150 minutes of moderate physical activity per week.  You can tell that you are exercising at a moderate intensity if your heart starts beating faster and you start breathing faster but can still hold a conversation. Moderate-intensity exercise ideas  include: Walking 1 mile (1.6 km) in about 15 minutes Biking Hiking Golfing Dancing Water aerobics Patient will verbalize understanding of everyday activities that increase physical activity by providing examples like the following: Yard work, such as: Insurance underwriter Gardening Washing windows or floors Patient will be able to explain general safety guidelines for exercising:  Before you start a new exercise program, talk with your health care provider. Do not exercise so much that you hurt yourself, feel dizzy, or get very short of breath. Wear comfortable clothes and wear shoes with good support. Drink plenty of water while you exercise to prevent dehydration or heat stroke. Work out until your breathing and your heartbeat get faster.       Patient Stated      Patient states she would like to walk for longer periods of time.       Patient Stated she has some knee pain. she is scheduled to have physical therapy session tomorrow      Interventions: Discussed client needs with client. She is going to appointment today with PCP Discussed knee pain issues. She is scheduled to have physical therapy session tomorrow Discussed recovery from knee surgery. She had knee surgery on January 04, 2023. Discussed family support. Has support from her spouse, Barkley. Has support from her children Discussed program support Encouraged client to call LCSW as needed at [phone]        This is a list of the screening recommended for you and due dates:  Health Maintenance  Topic Date Due   Complete foot exam   Never done   Eye exam for diabetics  Never done   HIV Screening  Never done   Hepatitis C Screening  Never done   Pneumococcal Vaccine (1 of 2 - PCV) Never done   Hepatitis B Vaccine (1 of 3 - 19+ 3-dose series) Never done   COVID-19 Vaccine (3 - Pfizer risk series) 01/24/2021   Hemoglobin A1C   09/20/2022   Cologuard (Stool DNA test)  04/22/2024   Mammogram  06/12/2024   Flu Shot  05/16/2024   Yearly kidney function blood test for diabetes  02/25/2025   Yearly kidney health urinalysis for diabetes  02/25/2025   Medicare Annual Wellness Visit  06/12/2025   DTaP/Tdap/Td vaccine (2 - Td or Tdap) 12/13/2026   Pap with HPV screening  08/29/2027   HPV Vaccine  Aged Out   Meningitis B Vaccine  Aged Out

## 2024-06-12 NOTE — Progress Notes (Addendum)
 Subjective:   Evelyn Sanchez is a 48 y.o. female who presents for Medicare Annual (Subsequent) preventive examination.  Visit Complete: Virtual I connected with  Richardson Breed on 06/12/24 by a audio enabled telemedicine application and verified that I am speaking with the correct person using two identifiers.  Patient Location: Home  Provider Location: Office/Clinic  I discussed the limitations of evaluation and management by telemedicine. The patient expressed understanding and agreed to proceed.  Vital Signs: Because this visit was a virtual/telehealth visit, some criteria may be missing or patient reported. Any vitals not documented were not able to be obtained and vitals that have been documented are patient reported.  Patient Medicare AWV questionnaire was completed by the patient on n/a; I have confirmed that all information answered by patient is correct and no changes since this date.  Cardiac Risk Factors include: obesity (BMI >30kg/m2);hypertension;smoking/ tobacco exposure;diabetes mellitus;advanced age (>3men, >34 women)     Objective:    Today's Vitals   06/12/24 0939  Weight: 185 lb (83.9 kg)  Height: 5' 3 (1.6 m)  PainSc: 6    Body mass index is 32.77 kg/m.     06/12/2024    9:54 AM 06/05/2023   10:05 AM 08/08/2021   12:10 PM 12/11/2017    9:42 AM 07/29/2012   11:49 AM 07/22/2012   11:44 AM  Advanced Directives  Does Patient Have a Medical Advance Directive? No No No No   Patient does not have advance directive   Does patient want to make changes to medical advance directive?  No - Patient declined      Would patient like information on creating a medical advance directive? No - Patient declined  No - Patient declined     Pre-existing out of facility DNR order (yellow form or pink MOST form)     No       Data saved with a previous flowsheet row definition    Current Medications (verified) Outpatient Encounter Medications as of 06/12/2024  Medication Sig    albuterol  (PROVENTIL ) (2.5 MG/3ML) 0.083% nebulizer solution USE 1 VIAL VIA NEBULIZER EVERY 4 HOURS AS NEEDED FOR WHEEZING OR SHORTNESS OF BREATH   albuterol  (VENTOLIN  HFA) 108 (90 Base) MCG/ACT inhaler TAKE 2 PUFFS BY MOUTH EVERY 6 HOURS AS NEEDED FOR WHEEZE   Blood Glucose Monitoring Suppl (ACCU-CHEK GUIDE ME) w/Device KIT 1 EACH BY DOES NOT APPLY ROUTE IN THE MORNING, AT NOON, AND AT BEDTIME. MAY SUBSTITUTE TO ANY MANUFACTURER COVERED BY PATIENT'S INSURANCE.   busPIRone  (BUSPAR ) 15 MG tablet TAKE 1 TABLET BY MOUTH 3 TIMES A DAY   cephALEXin (KEFLEX) 500 MG capsule Take by mouth.   cyclobenzaprine (FLEXERIL) 10 MG tablet Take 10 mg by mouth 2 (two) times daily as needed.   DULoxetine  (CYMBALTA ) 60 MG capsule TAKE 2 CAPSULES BY MOUTH EVERY DAY   glucose blood (ACCU-CHEK GUIDE TEST) test strip TO TEST IN THE MORNING, AT NOON, AND AT BEDTIME.   Menthol , Topical Analgesic, (BENGAY EX) Apply 1 application topically 2 (two) times daily as needed (for pain).   ondansetron  (ZOFRAN -ODT) 8 MG disintegrating tablet Take 1 tablet (8 mg total) by mouth every 8 (eight) hours as needed for nausea.   oxyCODONE -acetaminophen  (PERCOCET) 7.5-325 MG tablet Take 1 tablet by mouth every 6 (six) hours as needed.   Respiratory Therapy Supplies (NEBULIZER AIR TUBE/PLUGS) MISC Mask and tubing for use as needed   tirzepatide  (MOUNJARO ) 2.5 MG/0.5ML Pen INJECT 2.5 MG INTO THE SKIN WEEKLY   topiramate  (  TOPAMAX ) 50 MG tablet TAKE 1 TABLET BY MOUTH TWICE A DAY   traMADol  (ULTRAM -ER) 100 MG 24 hr tablet Take 100 mg by mouth daily.   valsartan  (DIOVAN ) 80 MG tablet TAKE 1 TABLET BY MOUTH EVERY DAY   nystatin (MYCOSTATIN) 100000 UNIT/ML suspension Take by mouth. (Patient not taking: Reported on 06/12/2024)   pregabalin  (LYRICA ) 75 MG capsule 1 capsule p.o. nightly for a week then twice daily for a week then 3 times daily (Patient not taking: Reported on 06/12/2024)   rizatriptan  (MAXALT ) 10 MG tablet TAKE 1 TABLET BY MOUTH AS  NEEDED FOR MIGRAINE. MAY REPEAT IN 2 HOURS IF NEEDED (Patient not taking: Reported on 06/12/2024)   [DISCONTINUED] oseltamivir  (TAMIFLU ) 75 MG capsule Take 1 capsule (75 mg total) by mouth daily.   No facility-administered encounter medications on file as of 06/12/2024.    Allergies (verified) Nickel and Ambien [zolpidem tartrate]   History: Past Medical History:  Diagnosis Date   Abnormal pap 1999   colpo   Abnormal uterine bleeding 05/02/2012   Anxiety    Arthritis of both knees 2011   Back pain    Basal cell carcinoma    Benign essential hypertension 10/22/2017   Bilateral hand numbness 03/02/2016   Bipolar I disorder, severe, current or most recent episode depressed, with rapid cycling (HCC) 06/11/2014   07/05/2018 PHQ 9 = 20, GAD7 = 15 08/02/2018 PHQ 9 = 14, GAD7 = 18 09/02/2018 PHQ 9 = 7, GAD7 = 7 09/30/2018 PHQ 9 = 8, GAD 7 = 8 06/10/2019 PHQ 9 = 8, GAD 7 = 16   Chest pain, typical 09/30/2018   Chlamydia    Chronic pain    Chronic pain syndrome 04/06/2016   Depression    Epidural anesthesia-induced headache during labor and delivery 09/30/2021   Family history of ovarian cancer 05/02/2012   Femoral neuropathy of left lower extremity 11/26/2014   NCV/EMG: Chronic neurogenic changes to left vastus lateralis, could be old L4 radiculopathy, femoral neuropathy, focal muscle damage.  No other evidence of radiculopathy.    Fracture of coronoid process of ulna, left, closed 11/12/2014   Ganglion cyst of flexor tendon sheath of finger, right 07/05/2018   Ganglion cyst of wrist 01/01/2014   GERD (gastroesophageal reflux disease)    Hair loss 06/10/2019   Hypertension    Lateral epicondylitis of right elbow 11/15/2016   Lumbar facet arthropathy 03/08/2017   Malignant melanoma (HCC) 11/19/2017   Melanoma (HCC)    Melanoma (HCC)    Migraine without status migrainosus, not intractable 03/19/2014   Overview:  Last Assessment & Plan:  Per patient request referral to neurology.    Migraines    Obesity 02/03/2014   Ovarian cyst    Periapical abscess 01/08/2019   Pneumonia    Post traumatic stress disorder 03/09/2012   Premenstrual tension syndrome 02/10/2014   Overview:  Overview:  OCPs started 02/10/14   Primary osteoarthritis of left knee 01/01/2014   PTSD (post-traumatic stress disorder)    Severe acute respiratory syndrome coronavirus 2 (SARS-CoV-2) detected 10/14/2018   Shivering 03/05/2019   Trichomonas    Vaginal Pap smear, abnormal    Weakness of both legs 12/17/2012   Past Surgical History:  Procedure Laterality Date   ABLATION     ADENOIDECTOMY     APPENDECTOMY  1996   DILATION AND CURETTAGE OF UTERUS  1998   edometrial ablation  2013   KNEE ARTHROSCOPY  1991   Left Kneex2   LYMPH NODE BIOPSY Left  12/11/2017   Procedure: SENTINEL LYMPH NODE MAPPING  BIOPSY;  Surgeon: Aron Shoulders, MD;  Location: [facility] OR;  Service: General;  Laterality: Left;   MELANOMA EXCISION Left 12/11/2017   Procedure: EXCISION OF PRIOR SCAR LEFT SHOULDER MELANOMA WITH CLOSURE;  Surgeon: Aron Shoulders, MD;  Location: [facility] OR;  Service: General;  Laterality: Left;   Tonsilectomy  1984   TONSILLECTOMY     TOTAL KNEE ARTHROPLASTY  12/18/2019   TOTAL KNEE REVISION     x2 01/09/20 and 01/20/20   TUBAL LIGATION  1999   tubes in ears     4x   TUBOPLASTY / TUBOTUBAL ANASTOMOSIS  1980   Four times between 1980-1990   WISDOM TOOTH EXTRACTION     Family History  Problem Relation Age of Onset   Hypertension Mother    Depression Mother    Diabetes type II Mother    Diabetes Mother    Parkinson's disease Mother    Heart attack Father    Hypertension Father    Diabetes type II Father    Diabetes Father    Hyperlipidemia Father    Prostate cancer Father    ALS Father    Heart disease Father    Parkinson's disease Father    Depression Sister    Diabetes type II Sister    Diabetes Sister    High blood pressure Sister    Cancer Maternal Aunt    Alcohol abuse Maternal Uncle     Cerebral aneurysm Maternal Uncle    Alcohol abuse Paternal Uncle    Hypertension Maternal Grandmother    Cancer Maternal Grandmother        cervical   Hypertension Maternal Grandfather    Diabetes Maternal Grandfather    Stroke Maternal Grandfather    Stomach cancer Maternal Grandfather    Hypertension Paternal Grandmother    Aneurysm Paternal Grandmother    Cerebral aneurysm Paternal Grandmother    Hypertension Paternal Grandfather    Diabetes Paternal Grandfather    Breast cancer Cousin        81s   Breast cancer Cousin        32s   Hyperlipidemia Brother    Social History   Socioeconomic History   Marital status: Legally Separated    Spouse name: Barkley   Number of children: 2   Years of education: 12   Highest education level: 12th grade  Occupational History   Occupation: Legally disabled.  Tobacco Use   Smoking status: Former    Current packs/day: 0.00    Average packs/day: 0.5 packs/day for 10.0 years (5.0 ttl pk-yrs)    Types: Cigarettes    Start date: 11/09/1999    Quit date: 11/08/2009    Years since quitting: 14.6   Smokeless tobacco: Never  Vaping Use   Vaping status: Never Used  Substance and Sexual Activity   Alcohol use: No   Drug use: No   Sexual activity: Yes    Partners: Male    Birth control/protection: Other-see comments    Comment: Tubal Ligation  Other Topics Concern   Not on file  Social History Narrative   Lives with her daughter. She has been separated from her husband for over a year. She enjoys coloring and enjoys diamond dots.   Social Drivers of Health   Financial Resource Strain: Low Risk  (03/10/2024)   Overall Financial Resource Strain (CARDIA)    Difficulty of Paying Living Expenses: Not very hard  Food Insecurity: Food Insecurity Present (06/12/2024)  Hunger Vital Sign    Worried About Running Out of Food in the Last Year: Never true    Ran Out of Food in the Last Year: Sometimes true  Transportation Needs: No Transportation  Needs (06/12/2024)   PRAPARE - Administrator, Civil Service (Medical): No    Lack of Transportation (Non-Medical): No  Physical Activity: Inactive (06/12/2024)   Exercise Vital Sign    Days of Exercise per Week: 0 days    Minutes of Exercise per Session: 0 min  Stress: No Stress Concern Present (06/12/2024)   [employer] of Occupational Health - Occupational Stress Questionnaire    Feeling of Stress: Not at all  Social Connections: Moderately Isolated (06/12/2024)   Social Connection and Isolation Panel    Frequency of Communication with Friends and Family: More than three times a week    Frequency of Social Gatherings with Friends and Family: Once a week    Attends Religious Services: Never    Database administrator or Organizations: Yes    Attends Engineer, structural: More than 4 times per year    Marital Status: Separated    Tobacco Counseling Counseling given: Not Answered   Clinical Intake:  Pre-visit preparation completed: Yes  Pain : 0-10 Pain Score: 6  Pain Type: Chronic pain Pain Location: Back Pain Orientation: Lower Pain Onset: More than a month ago Pain Frequency: Constant     BMI - recorded: 32.77 Nutritional Status: BMI > 30  Obese Nutritional Risks: None Diabetes: Yes CBG done?: Yes (125 mg/dl) CBG resulted in Enter/ Edit results?: No Did pt. bring in CBG monitor from home?: No  How often do you need to have someone help you when you read instructions, pamphlets, or other written materials from your doctor or pharmacy?: 1 - Never What is the last grade level you completed in school?: 12  Interpreter Needed?: No      Activities of Daily Living    06/12/2024    9:42 AM  In your present state of health, do you have any difficulty performing the following activities:  Hearing? 1  Vision? 0  Difficulty concentrating or making decisions? 1  Walking or climbing stairs? 1  Dressing or bathing? 0  Doing errands, shopping? 1   Preparing Food and eating ? N  Using the Toilet? N  In the past six months, have you accidently leaked urine? Y  Do you have problems with loss of bowel control? N  Managing your Medications? N  Managing your Finances? N  Housekeeping or managing your Housekeeping? N    Patient Care Team: Leah Hugger, MD (Student)  Indicate any recent Medical Services you may have received from other than [facility] providers in the past year (date may be approximate).     Assessment:   This is a routine wellness examination for Shoshanna.  Hearing/Vision screen No results found.   Goals Addressed             This Visit's Progress    Patient Stated       Patient states she would like to walk for longer periods of time.        Depression Screen    06/12/2024    9:52 AM 06/05/2023   10:06 AM 03/13/2023    9:37 AM 01/30/2023   10:12 AM 01/16/2023    9:26 AM 01/15/2023    1:08 PM 12/11/2022    1:03 PM  PHQ 2/9 Scores  PHQ - 2 Score  2 1 2 2 2 2 2   PHQ- 9 Score 7  5 5 7 5 5     Fall Risk    06/12/2024    9:55 AM 06/05/2023   10:05 AM 08/08/2021   12:10 PM 04/07/2020   10:25 AM  Fall Risk   Falls in the past year? 1 1 1 1   Number falls in past yr: 0 0 1 0  Injury with Fall? 0 0 1 0  Risk for fall due to : History of fall(s);Impaired mobility Impaired mobility History of fall(s);Impaired balance/gait;Impaired mobility   Follow up Falls evaluation completed Falls evaluation completed Falls evaluation completed;Education provided;Falls prevention discussed       Data saved with a previous flowsheet row definition    MEDICARE RISK AT HOME: Medicare Risk at Home Any stairs in or around the home?: Yes If so, are there any without handrails?: No Home free of loose throw rugs in walkways, pet beds, electrical cords, etc?: No Adequate lighting in your home to reduce risk of falls?: Yes Life alert?: No Use of a cane, walker or w/c?: Yes Grab bars in the bathroom?: No Shower chair or bench  in shower?: Yes Elevated toilet seat or a handicapped toilet?: No  TIMED UP AND GO:  Was the test performed?  No    Cognitive Function:        06/12/2024    9:56 AM 06/05/2023   10:12 AM 08/08/2021   12:24 PM  6CIT Screen  What Year? 0 points 0 points 0 points  What month? 0 points 0 points 0 points  What time? 0 points 0 points 0 points  Count back from 20 0 points 0 points 0 points  Months in reverse 0 points 0 points 2 points  Repeat phrase 0 points 0 points 0 points  Total Score 0 points 0 points 2 points    Immunizations Immunization History  Administered Date(s) Administered   Influenza Split 08/24/2011   Influenza, Seasonal, Injecte, Preservative Fre 07/22/2015, 08/31/2018   Influenza,inj,Quad PF,6+ Mos 11/15/2016, 10/22/2017, 08/31/2018, 06/29/2019, 07/23/2020, 06/21/2022   Influenza,inj,Quad PF,6-35 Mos 06/29/2019   Influenza,trivalent, recombinat, inj, PF 08/24/2011   Influenza-Unspecified 08/24/2011, 07/22/2015, 11/15/2016, 10/22/2017, 08/31/2018, 06/29/2019, 07/23/2020, 07/23/2023   PFIZER Comirnaty(Gray Top)Covid-19 Tri-Sucrose Vaccine 12/06/2020, 12/27/2020   Tdap 12/13/2016    TDAP status: Up to date  Flu Vaccine status: Due, Education has been provided regarding the importance of this vaccine. Advised may receive this vaccine at local pharmacy or Health Dept. Aware to provide a copy of the vaccination record if obtained from local pharmacy or Health Dept. Verbalized acceptance and understanding.  Pneumococcal vaccine status: Up to date  Covid-19 vaccine status: Declined, Education has been provided regarding the importance of this vaccine but patient still declined. Advised may receive this vaccine at local pharmacy or Health Dept.or vaccine clinic. Aware to provide a copy of the vaccination record if obtained from local pharmacy or Health Dept. Verbalized acceptance and understanding.  Qualifies for Shingles Vaccine? No   Zostavax completed No   Shingrix  Completed?: No.    Education has been provided regarding the importance of this vaccine. Patient has been advised to call insurance company to determine out of pocket expense if they have not yet received this vaccine. Advised may also receive vaccine at local pharmacy or Health Dept. Verbalized acceptance and understanding.  Screening Tests Health Maintenance  Topic Date Due   FOOT EXAM  Never done   OPHTHALMOLOGY EXAM  Never done  HIV Screening  Never done   Hepatitis C Screening  Never done   Pneumococcal Vaccine (1 of 2 - PCV) Never done   Hepatitis B Vaccines 19-59 Average Risk (1 of 3 - 19+ 3-dose series) Never done   COVID-19 Vaccine (3 - Pfizer risk series) 01/24/2021   HEMOGLOBIN A1C  09/20/2022   Fecal DNA (Cologuard)  04/22/2024   MAMMOGRAM  06/12/2024   INFLUENZA VACCINE  05/16/2024   Diabetic kidney evaluation - eGFR measurement  02/25/2025   Diabetic kidney evaluation - Urine ACR  02/25/2025   Medicare Annual Wellness (AWV)  06/12/2025   DTaP/Tdap/Td (2 - Td or Tdap) 12/13/2026   Cervical Cancer Screening (HPV/Pap Cotest)  08/29/2027   HPV VACCINES  Aged Out   Meningococcal B Vaccine  Aged Out    Health Maintenance  Health Maintenance Due  Topic Date Due   FOOT EXAM  Never done   OPHTHALMOLOGY EXAM  Never done   HIV Screening  Never done   Hepatitis C Screening  Never done   Pneumococcal Vaccine (1 of 2 - PCV) Never done   Hepatitis B Vaccines 19-59 Average Risk (1 of 3 - 19+ 3-dose series) Never done   COVID-19 Vaccine (3 - Pfizer risk series) 01/24/2021   HEMOGLOBIN A1C  09/20/2022   Fecal DNA (Cologuard)  04/22/2024   MAMMOGRAM  06/12/2024   INFLUENZA VACCINE  05/16/2024    Colorectal cancer screening: Type of screening: Cologuard. Completed 04/22/2021. Repeat every 3 years  Mammogram status: Completed 06/13/2023. Repeat every year   Lung Cancer Screening: (Low Dose CT Chest recommended if Age 70-80 years, 20 pack-year currently smoking OR have quit w/in  15years.) does not qualify.   Lung Cancer Screening Referral: n/a  Additional Screening:  Hepatitis C Screening: does qualify; Completed not done   Vision Screening: Recommended annual ophthalmology exams for early detection of glaucoma and other disorders of the eye. Is the patient up to date with their annual eye exam?  Yes  Who is the provider or what is the name of the office in which the patient attends annual eye exams? Doesn't remember If pt is not established with a provider, would they like to be referred to a provider to establish care? N/a.   Dental Screening: Recommended annual dental exams for proper oral hygiene  Diabetic Foot Exam: Diabetic Foot Exam: Overdue, Pt has been advised about the importance in completing this exam. Pt is scheduled for diabetic foot exam on  .  Community Resource Referral / Chronic Care Management: CRR required this visit?  No   CCM required this visit?  No     Plan:     I have personally reviewed and noted the following in the patient's chart:   Medical and social history Use of alcohol, tobacco or illicit drugs  Current medications and supplements including opioid prescriptions. Patient is currently taking opioid prescriptions. Information provided to patient regarding non-opioid alternatives. Patient advised to discuss non-opioid treatment plan with their provider. Functional ability and status Nutritional status Physical activity Advanced directives List of other physicians Hospitalizations # 1, surgeries # 1, and ER # 0 visits in previous 12 months Vitals Screenings to include cognitive, depression, and falls Referrals and appointments  In addition, I have reviewed and discussed with patient certain preventive protocols, quality metrics, and best practice recommendations. A written personalized care plan for preventive services as well as general preventive health recommendations were provided to patient.     Bonny Jon Mayor,  CMA   06/12/2024   After Visit Summary: (MyChart) Due to this being a telephonic visit, the after visit summary with patients personalized plan was offered to patient via MyChart   Nurse Notes:   Senetra Dillin is a 48 y.o. female patient who had a Medicare Annual Wellness Visit today via telephone. Junior is Disabled and lives with their daughter. She has 2 children. she reports that she is socially active and does interact with friends/family regularly. She is minimally physically active and enjoys adult coloring books and diamond dots.

## 2024-06-17 ENCOUNTER — Encounter: Payer: Self-pay | Admitting: Sports Medicine

## 2024-06-18 ENCOUNTER — Ambulatory Visit

## 2024-06-18 ENCOUNTER — Inpatient Hospital Stay (HOSPITAL_BASED_OUTPATIENT_CLINIC_OR_DEPARTMENT_OTHER): Admission: RE | Admit: 2024-06-18 | Source: Ambulatory Visit

## 2024-07-18 DIAGNOSIS — Z471 Aftercare following joint replacement surgery: Secondary | ICD-10-CM | POA: Diagnosis not present

## 2024-07-18 DIAGNOSIS — Z96652 Presence of left artificial knee joint: Secondary | ICD-10-CM | POA: Diagnosis not present

## 2024-07-22 ENCOUNTER — Telehealth: Payer: Self-pay

## 2024-07-22 NOTE — Telephone Encounter (Signed)
 Left message for a return call for colon cancer screening. She had a Cologuard more than 3 years ago.

## 2024-08-05 ENCOUNTER — Other Ambulatory Visit: Payer: Self-pay | Admitting: Medical Genetics

## 2024-08-05 DIAGNOSIS — Z006 Encounter for examination for normal comparison and control in clinical research program: Secondary | ICD-10-CM

## 2024-08-06 DIAGNOSIS — M47816 Spondylosis without myelopathy or radiculopathy, lumbar region: Secondary | ICD-10-CM | POA: Diagnosis not present

## 2024-08-06 DIAGNOSIS — Z79891 Long term (current) use of opiate analgesic: Secondary | ICD-10-CM | POA: Diagnosis not present

## 2024-08-06 DIAGNOSIS — M170 Bilateral primary osteoarthritis of knee: Secondary | ICD-10-CM | POA: Diagnosis not present

## 2024-08-06 DIAGNOSIS — Z5181 Encounter for therapeutic drug level monitoring: Secondary | ICD-10-CM | POA: Diagnosis not present

## 2024-08-06 DIAGNOSIS — G894 Chronic pain syndrome: Secondary | ICD-10-CM | POA: Diagnosis not present

## 2024-08-11 ENCOUNTER — Other Ambulatory Visit: Payer: Self-pay

## 2024-08-12 NOTE — Progress Notes (Signed)
 Evelyn Sanchez                                          MRN: [id number]   08/12/2024   The [facility] Quality Team Specialist reviewed this patient medical record for the purposes of chart review for care gap closure. The following were reviewed: chart review for care gap closure-colorectal cancer screening and diabetic eye exam.    [facility] Quality Team

## 2024-08-19 ENCOUNTER — Encounter: Payer: Self-pay | Admitting: Medical-Surgical

## 2024-08-19 ENCOUNTER — Other Ambulatory Visit: Payer: Self-pay | Admitting: Obstetrics & Gynecology

## 2024-08-19 ENCOUNTER — Ambulatory Visit: Admitting: Medical-Surgical

## 2024-08-19 VITALS — BP 145/87 | HR 54 | Resp 20 | Ht 63.0 in | Wt 184.0 lb

## 2024-08-19 DIAGNOSIS — E1169 Type 2 diabetes mellitus with other specified complication: Secondary | ICD-10-CM

## 2024-08-19 DIAGNOSIS — E119 Type 2 diabetes mellitus without complications: Secondary | ICD-10-CM

## 2024-08-19 DIAGNOSIS — F314 Bipolar disorder, current episode depressed, severe, without psychotic features: Secondary | ICD-10-CM

## 2024-08-19 DIAGNOSIS — Z1231 Encounter for screening mammogram for malignant neoplasm of breast: Secondary | ICD-10-CM

## 2024-08-19 DIAGNOSIS — F419 Anxiety disorder, unspecified: Secondary | ICD-10-CM | POA: Diagnosis not present

## 2024-08-19 DIAGNOSIS — I10 Essential (primary) hypertension: Secondary | ICD-10-CM

## 2024-08-19 DIAGNOSIS — Z1211 Encounter for screening for malignant neoplasm of colon: Secondary | ICD-10-CM

## 2024-08-19 DIAGNOSIS — G43909 Migraine, unspecified, not intractable, without status migrainosus: Secondary | ICD-10-CM

## 2024-08-19 DIAGNOSIS — Z23 Encounter for immunization: Secondary | ICD-10-CM | POA: Diagnosis not present

## 2024-08-19 DIAGNOSIS — L570 Actinic keratosis: Secondary | ICD-10-CM

## 2024-08-19 DIAGNOSIS — G894 Chronic pain syndrome: Secondary | ICD-10-CM

## 2024-08-19 LAB — POCT GLYCOSYLATED HEMOGLOBIN (HGB A1C)
HbA1c, POC (controlled diabetic range): 6.5 % (ref 0.0–7.0)
Hemoglobin A1C: 6.5 % — AB (ref 4.0–5.6)

## 2024-08-19 MED ORDER — TOPIRAMATE 50 MG PO TABS: 50.0000 mg | ORAL_TABLET | Freq: Two times a day (BID) | ORAL | 3 refills | Status: AC

## 2024-08-19 MED ORDER — ALBUTEROL SULFATE (2.5 MG/3ML) 0.083% IN NEBU: INHALATION_SOLUTION | RESPIRATORY_TRACT | 5 refills | Status: AC

## 2024-08-19 MED ORDER — RIZATRIPTAN BENZOATE 10 MG PO TABS: ORAL_TABLET | ORAL | 5 refills | Status: AC

## 2024-08-19 MED ORDER — ALBUTEROL SULFATE HFA 108 (90 BASE) MCG/ACT IN AERS: INHALATION_SPRAY | RESPIRATORY_TRACT | 8 refills | Status: AC

## 2024-08-19 MED ORDER — VALSARTAN 160 MG PO TABS
160.0000 mg | ORAL_TABLET | Freq: Every day | ORAL | 3 refills | Status: DC
Start: 2024-08-19 — End: 2024-09-02

## 2024-08-19 MED ORDER — BUSPIRONE HCL 15 MG PO TABS: 15.0000 mg | ORAL_TABLET | Freq: Three times a day (TID) | ORAL | 3 refills | Status: AC

## 2024-08-19 NOTE — Progress Notes (Unsigned)
 Established patient visit   History of Present Illness   Discussed the use of AI scribe software for clinical note transcription with the patient, who gave verbal consent to proceed.  History of Present Illness   Evelyn Sanchez is a 48 year old female who presents for medication management and evaluation of a skin lesion.  Cutaneous lesion - Skin lesion with changes in color and size - History of malignant melanoma and another skin cancer  Migraine headaches - Infrequent migraines - Managed with Maxalt  and Topamax   Chronic pain - Uses hydrocodone  and tramadol  for pain control - Discontinued Lyrica  and Cymbalta , resulting in increased aches and pains - Not interested in restarting Cymbalta   Hypertension - Home blood pressure readings range from 170 to 190 mmHg - Currently on valsartan  after switching from two other antihypertensive medications - Anxiety and pain may contribute to elevated blood pressure  Type 2 diabetes mellitus - Recently increased Mounjaro  dose to 5 mg - Tolerates current dose well  Bipolar i disorder and anxiety - Feels stable with current mood - Discontinued Cymbalta  and Buspar , previously used for mood and anxiety - Experiences anxiety while driving and prefers not to drive, relies on others for transportation  History of recurrent knee infections - On lifelong Keflex due to prior knee surgeries and infections, including MRSA and staph infections       Physical Exam   Physical Exam Assessment & Plan   Problem List Items Addressed This Visit       Cardiovascular and Mediastinum   Benign essential hypertension   Relevant Medications   valsartan  (DIOVAN ) 160 MG tablet   Migraine without status migrainosus, not intractable   Relevant Medications   rizatriptan  (MAXALT ) 10 MG tablet   topiramate  (TOPAMAX ) 50 MG tablet   valsartan  (DIOVAN ) 160 MG tablet     Endocrine   Controlled type 2 diabetes mellitus without complication, without  long-term current use of insulin (HCC)   Relevant Medications   valsartan  (DIOVAN ) 160 MG tablet     Other   Bipolar I disorder, severe, current or most recent episode depressed, with rapid cycling (HCC)   Relevant Medications   busPIRone  (BUSPAR ) 15 MG tablet   Other Visit Diagnoses       Type 2 diabetes mellitus with other specified complication, without long-term current use of insulin (HCC)    -  Primary   Relevant Medications   valsartan  (DIOVAN ) 160 MG tablet     Need for influenza vaccination       Relevant Orders   Flu vaccine trivalent PF, 6mos and older(Flulaval,Afluria,Fluarix,Fluzone)     Morbid (severe) obesity due to excess calories (HCC)         Colon cancer screening       Relevant Orders   Cologuard      Assessment and Plan    Migraine prophylaxis and management Migraines infrequent, managed with Maxalt  and Topamax . - Continue Maxalt  as needed for migraines. - Continue Topamax  for migraine prophylaxis.  Type 2 diabetes mellitus Mounjaro  increased to 5 mg, well tolerated. Plan to increase to 7.5 mg after third dose. - Continue Mounjaro  5 mg. - Increase Mounjaro  to 7.5 mg after third dose.  Morbid obesity  Bipolar disorder, current episode depressed, severe, without psychotic features Depressive episode ongoing. Stopped Cymbalta , symptoms worsened. Prefers not to restart Cymbalta . - Continue to hold off on restarting Cymbalta .  Essential hypertension Home blood pressure elevated. Current reading 143/72 mmHg. Anxiety and  pain may contribute. Agreed to increase valsartan . - Increased valsartan  dose.  Chronic pain Managed with hydrocodone  and tramadol . Pain clinic involved. - Continue hydrocodone  and tramadol  for pain management.  Generalized anxiety disorder Increased anxiety symptoms. Previously stopped Buspar , experiencing more anxiety. - Refilled Buspar  prescription.  Actinic keratosis Lesion identified, agreed to cryotherapy. - Performed  cryotherapy on actinic keratosis.  Personal history of malignant melanoma of skin Previously treated.  General Health Maintenance Cologuard and eye exam due. - Reordered Cologuard for colon cancer screening. - Schedule eye exam.        Follow up   Return in about 2 weeks (around 09/02/2024) for nurse visit for BP check. __________________________________ Zada FREDRIK Palin, DNP, APRN, FNP-BC Primary Care and Sports Medicine [facility] [place]

## 2024-08-21 DIAGNOSIS — L570 Actinic keratosis: Secondary | ICD-10-CM | POA: Insufficient documentation

## 2024-08-21 DIAGNOSIS — F419 Anxiety disorder, unspecified: Secondary | ICD-10-CM | POA: Insufficient documentation

## 2024-08-21 NOTE — Assessment & Plan Note (Signed)
 Depressive episode ongoing. Stopped Cymbalta , symptoms worsened. Prefers not to restart Cymbalta . Continues BuSpar . - Continue to hold off on restarting Cymbalta . - Continue BuSpar .

## 2024-08-21 NOTE — Assessment & Plan Note (Signed)
 Managed with hydrocodone  and tramadol . Pain clinic involved. - Follow up with pain management as directed.

## 2024-08-21 NOTE — Assessment & Plan Note (Signed)
 Increased anxiety symptoms. Previously stopped Buspar , experiencing more anxiety. - Restart Buspar  prescription.

## 2024-08-21 NOTE — Assessment & Plan Note (Signed)
 Migraines infrequent, managed with Maxalt  and Topamax . - Continue Maxalt  as needed for migraines. - Continue Topamax  50mg  BID for migraine prophylaxis.

## 2024-08-21 NOTE — Assessment & Plan Note (Signed)
 Lesion identified, agreed to cryotherapy. - Performed cryotherapy on actinic keratosis.  Procedure: Cryodestruction of: actinic keratosis x 1 Consent obtained and verified. Time-out conducted. Noted no overlying erythema, induration, or other signs of local infection. Completed without difficulty using Cryo-Gun. Advised to call if fevers/chills, erythema, induration, drainage, or persistent bleeding.

## 2024-08-21 NOTE — Assessment & Plan Note (Signed)
 Home blood pressure elevated. Current reading 143/72 mmHg. Anxiety and pain may contribute. Agreed to increase valsartan . - Increased valsartan  dose to 160mg  daily. - Continue home monitoring with a goal of 130/80 or less.  - Return in 2 weeks for NV for BP recheck.

## 2024-08-21 NOTE — Assessment & Plan Note (Signed)
 Mounjaro  increased to 5 mg, well tolerated. Plan to increase to 7.5 mg after third dose. - A1c 6.5% today. - Continue Mounjaro  5 mg. - Increase Mounjaro  to 7.5 mg after fourth dose.

## 2024-08-27 NOTE — Progress Notes (Signed)
 Evelyn Sanchez                                          MRN: [id number]   08/27/2024   The [facility] Quality Team Specialist reviewed this patient medical record for the purposes of chart review for care gap closure. The following were reviewed: chart review for care gap closure-colorectal cancer screening and diabetic eye exam.    [facility] Quality Team

## 2024-08-27 NOTE — Progress Notes (Signed)
 Evelyn Sanchez                                          MRN: [id number]   08/27/2024   The [facility] Quality Team Specialist reviewed this patient medical record for the purposes of chart review for care gap closure. The following were reviewed: abstraction for care gap closure-glycemic status assessment.    [facility] Quality Team

## 2024-09-01 NOTE — Progress Notes (Unsigned)
   Subjective:    Patient ID: Evelyn Sanchez, female    DOB: 02-Jun-1976, 48 y.o.   MRN: [id number]  HPI  Patient is here for a two week BP check. Per joy's last ON. Home blood pressure elevated. Current reading 143/72 mmHg. Anxiety and pain may contribute. Agreed to increase valsartan . Increased valsartan  dose to 160mg  daily. Continue home monitoring with a goal of 130/80 or less. Return in 2 weeks for NV for BP recheck. Patient denies CP, SOB, headaches, medication issues, or vision changes   Review of Systems     Objective:   Physical Exam        Assessment & Plan:   Patients first BP read. Pt sat for 10 mins and it was retaken. Second reading is

## 2024-09-02 ENCOUNTER — Other Ambulatory Visit: Payer: Self-pay

## 2024-09-02 ENCOUNTER — Ambulatory Visit (INDEPENDENT_AMBULATORY_CARE_PROVIDER_SITE_OTHER)

## 2024-09-02 VITALS — BP 152/86 | HR 71 | Resp 18 | Ht 63.0 in | Wt 184.0 lb

## 2024-09-02 DIAGNOSIS — Z1231 Encounter for screening mammogram for malignant neoplasm of breast: Secondary | ICD-10-CM

## 2024-09-02 DIAGNOSIS — I10 Essential (primary) hypertension: Secondary | ICD-10-CM | POA: Diagnosis not present

## 2024-09-02 MED ORDER — VALSARTAN-HYDROCHLOROTHIAZIDE 160-12.5 MG PO TABS: 1.0000 | ORAL_TABLET | Freq: Every day | ORAL | 1 refills | Status: AC

## 2024-09-15 NOTE — Progress Notes (Unsigned)
   Subjective:    Patient ID: Evelyn Sanchez, female    DOB: 1976-04-21, 48 y.o.   MRN: [id number]  HPI  Patient is here for a two week BP check. Patient switched from valsartan  160mg  to valsartan -hydrochlorothiazide  160-12.5 denies CP, SOB, palpitations, or vision changes.  Review of Systems     Objective:   Physical Exam        Assessment & Plan:   Patients BP in office today is 122/74. Reported to Zada Palin, NP who is satisfied with this reading. Patient is to f/u with PCP in 3 months

## 2024-09-16 ENCOUNTER — Ambulatory Visit

## 2024-09-16 VITALS — BP 122/74 | HR 72 | Resp 18 | Ht 63.0 in | Wt 184.0 lb

## 2024-09-16 DIAGNOSIS — I10 Essential (primary) hypertension: Secondary | ICD-10-CM | POA: Diagnosis not present

## 2024-09-16 LAB — COLOGUARD: COLOGUARD: NEGATIVE

## 2024-09-17 ENCOUNTER — Ambulatory Visit: Payer: Self-pay | Admitting: Medical-Surgical

## 2024-10-02 ENCOUNTER — Ambulatory Visit: Payer: Self-pay | Admitting: Obstetrics & Gynecology

## 2024-10-12 NOTE — Progress Notes (Unsigned)
 "       Established patient visit   History of Present Illness   Discussed the use of AI scribe software for clinical note transcription with the patient, who gave verbal consent to proceed.  History of Present Illness   Evelyn Sanchez is a 48 year old female with chronic back pain who presents with worsening pain after a recent fall.  Lumbar and lower extremity pain - Chronic back pain with recent worsening over the past 2 weeks after tripping over a bucket while carrying her grandson and twisting to protect him - Pain is constant, sharp, and throbbing - Pain is worse in the right leg - Tingling in the left leg, similar to prior postsurgical symptoms - History of surgeries on the left leg with prior nerve-related symptoms  Analgesic and adjunctive medication use - Current regimen includes Norco up to four times daily as needed and extended-release tramadol  - Also taking tizanidine  and Flexeril - Maximum dose of Cymbalta  - Previous trials of gabapentin  and Lyrica , with Lyrica  used short term after surgery - Current medications provide little relief  Physical therapy and rehabilitation - No recent physical therapy - Reluctant to restart physical therapy, as prior PT for her legs was not helpful  Imaging and laboratory evaluation - Last spine MRI performed in 2022 - Recent blood work generally normal, requesting update - Thyroid  testing 7 months ago was normal - Cholesterol has not been checked recently  Psychosocial impact of pain - Avoids emergency care for pain due to feeling labeled as a drug seeker - Desires improved pain control without stigma     Physical Exam   Physical Exam Vitals reviewed.  Constitutional:      General: She is not in acute distress.    Appearance: Normal appearance. She is ill-appearing.  HENT:     Head: Normocephalic and atraumatic.  Cardiovascular:     Rate and Rhythm: Normal rate and regular rhythm.     Pulses: Normal pulses.     Heart  sounds: Normal heart sounds. No murmur heard.    No friction rub. No gallop.  Pulmonary:     Effort: Pulmonary effort is normal. No respiratory distress.     Breath sounds: Normal breath sounds. No wheezing.  Skin:    General: Skin is warm and dry.  Neurological:     Mental Status: She is alert and oriented to person, place, and time.  Psychiatric:        Mood and Affect: Mood normal. Affect is tearful.        Behavior: Behavior normal.        Thought Content: Thought content normal.        Judgment: Judgment normal.    Assessment & Plan   Lumbar radiculitis/Somatic dysfunction of spine, lumbar  Chronic lumbar radiculopathy exacerbated by recent fall. Inadequate pain control with current regimen. Updated imaging needed after new injury. Discussed Lyrica  for neuropathic pain. - Administered Depo-Medrol  80 mg and Toradol  60 mg IM in office. - Ordered lumbar spine x-ray. - Referred to sports medicine specialist, Dr. Curtis. - Initiate steroid burst of 50mg  daily x 5 days starting tomorrow. - Start Lyrica  50mg  TID, notify pain management team.  Hypertension associated with diabetes (HCC) BP stable and at goal today. Well-managed with Diovan -HCT.  - Continue Diovan -HCT 160-12.5mg  daily. - Updating labs today.     Follow up   Return if symptoms worsen or fail to improve. __________________________________ Zada FREDRIK Palin, DNP, APRN, FNP-BC Primary Care and  Sports Medicine [facility] [place] "

## 2024-10-13 ENCOUNTER — Encounter: Payer: Self-pay | Admitting: Medical-Surgical

## 2024-10-13 ENCOUNTER — Ambulatory Visit

## 2024-10-13 ENCOUNTER — Ambulatory Visit: Admitting: Medical-Surgical

## 2024-10-13 VITALS — BP 121/76 | HR 61 | Resp 20 | Ht 63.0 in | Wt 182.0 lb

## 2024-10-13 DIAGNOSIS — M5416 Radiculopathy, lumbar region: Secondary | ICD-10-CM | POA: Diagnosis not present

## 2024-10-13 DIAGNOSIS — M9903 Segmental and somatic dysfunction of lumbar region: Secondary | ICD-10-CM

## 2024-10-13 DIAGNOSIS — I152 Hypertension secondary to endocrine disorders: Secondary | ICD-10-CM | POA: Insufficient documentation

## 2024-10-13 DIAGNOSIS — E1159 Type 2 diabetes mellitus with other circulatory complications: Secondary | ICD-10-CM

## 2024-10-13 MED ORDER — PREGABALIN 50 MG PO CAPS: 50.0000 mg | ORAL_CAPSULE | Freq: Three times a day (TID) | ORAL | 1 refills | Status: AC

## 2024-10-13 MED ORDER — KETOROLAC TROMETHAMINE 60 MG/2ML IM SOLN
60.0000 mg | Freq: Once | INTRAMUSCULAR | Status: AC
  Administered 2024-10-13: 60 mg via INTRAMUSCULAR

## 2024-10-13 MED ORDER — KETOROLAC TROMETHAMINE 60 MG/2ML IM SOLN: 60.0000 mg | Freq: Once | INTRAMUSCULAR | Status: DC

## 2024-10-13 MED ORDER — PREDNISONE 50 MG PO TABS: 50.0000 mg | ORAL_TABLET | Freq: Every day | ORAL | 0 refills | Status: AC

## 2024-10-13 MED ORDER — METHYLPREDNISOLONE ACETATE 80 MG/ML IJ SUSP
80.0000 mg | Freq: Once | INTRAMUSCULAR | Status: AC
  Administered 2024-10-13: 80 mg via INTRAMUSCULAR

## 2024-10-13 NOTE — Addendum Note (Signed)
 Addended by: FANNY NIELS CROME on: 10/13/2024 04:53 PM   Modules accepted: Orders

## 2024-10-14 ENCOUNTER — Encounter: Payer: Self-pay | Admitting: Medical-Surgical

## 2024-10-14 ENCOUNTER — Ambulatory Visit: Payer: Self-pay | Admitting: Medical-Surgical

## 2024-10-14 LAB — COMPREHENSIVE METABOLIC PANEL WITH GFR
ALT: 17 IU/L (ref 0–32)
AST: 16 IU/L (ref 0–40)
Albumin: 4.6 g/dL (ref 3.9–4.9)
Alkaline Phosphatase: 79 IU/L (ref 41–116)
BUN/Creatinine Ratio: 16 (ref 9–23)
BUN: 16 mg/dL (ref 6–24)
Bilirubin Total: 0.4 mg/dL (ref 0.0–1.2)
CO2: 21 mmol/L (ref 20–29)
Calcium: 9.8 mg/dL (ref 8.7–10.2)
Chloride: 104 mmol/L (ref 96–106)
Creatinine, Ser: 1.02 mg/dL — ABNORMAL HIGH (ref 0.57–1.00)
Globulin, Total: 2.4 g/dL (ref 1.5–4.5)
Glucose: 108 mg/dL — ABNORMAL HIGH (ref 70–99)
Potassium: 4.2 mmol/L (ref 3.5–5.2)
Sodium: 142 mmol/L (ref 134–144)
Total Protein: 7 g/dL (ref 6.0–8.5)
eGFR: 67 mL/min/1.73

## 2024-10-14 LAB — CBC WITH DIFFERENTIAL/PLATELET
Basophils Absolute: 0.1 x10E3/uL (ref 0.0–0.2)
Basos: 1 %
EOS (ABSOLUTE): 0.1 x10E3/uL (ref 0.0–0.4)
Eos: 2 %
Hematocrit: 44.9 % (ref 34.0–46.6)
Hemoglobin: 14.2 g/dL (ref 11.1–15.9)
Immature Grans (Abs): 0 x10E3/uL (ref 0.0–0.1)
Immature Granulocytes: 0 %
Lymphocytes Absolute: 2.4 x10E3/uL (ref 0.7–3.1)
Lymphs: 38 %
MCH: 27.6 pg (ref 26.6–33.0)
MCHC: 31.6 g/dL (ref 31.5–35.7)
MCV: 87 fL (ref 79–97)
Monocytes Absolute: 0.4 x10E3/uL (ref 0.1–0.9)
Monocytes: 6 %
Neutrophils Absolute: 3.4 x10E3/uL (ref 1.4–7.0)
Neutrophils: 53 %
Platelets: 300 x10E3/uL (ref 150–450)
RBC: 5.15 x10E6/uL (ref 3.77–5.28)
RDW: 14.5 % (ref 11.7–15.4)
WBC: 6.4 x10E3/uL (ref 3.4–10.8)

## 2024-10-14 LAB — LIPID PANEL
Chol/HDL Ratio: 5.1 ratio — ABNORMAL HIGH (ref 0.0–4.4)
Cholesterol, Total: 204 mg/dL — ABNORMAL HIGH (ref 100–199)
HDL: 40 mg/dL
LDL Chol Calc (NIH): 86 mg/dL (ref 0–99)
Triglycerides: 481 mg/dL — ABNORMAL HIGH (ref 0–149)
VLDL Cholesterol Cal: 78 mg/dL — ABNORMAL HIGH (ref 5–40)

## 2024-10-15 MED ORDER — LOVASTATIN 20 MG PO TABS: 20.0000 mg | ORAL_TABLET | Freq: Every day | ORAL | 3 refills | Status: AC

## 2024-10-19 ENCOUNTER — Other Ambulatory Visit: Payer: Self-pay | Admitting: Medical-Surgical

## 2024-10-20 ENCOUNTER — Encounter: Payer: Self-pay | Admitting: Medical-Surgical

## 2024-10-22 ENCOUNTER — Other Ambulatory Visit: Payer: Self-pay

## 2024-10-22 DIAGNOSIS — E119 Type 2 diabetes mellitus without complications: Secondary | ICD-10-CM

## 2024-10-22 MED ORDER — ONDANSETRON 8 MG PO TBDP: 8.0000 mg | ORAL_TABLET | Freq: Three times a day (TID) | ORAL | 3 refills | Status: AC | PRN

## 2024-10-24 NOTE — Telephone Encounter (Signed)
 Changed to a stat read.

## 2024-10-29 ENCOUNTER — Other Ambulatory Visit: Payer: Self-pay

## 2024-10-29 MED ORDER — MOUNJARO 5 MG/0.5ML ~~LOC~~ SOAJ: 5.0000 mg | SUBCUTANEOUS | 1 refills | Status: AC

## 2024-10-29 NOTE — Progress Notes (Signed)
 Last filled 10/06/2024 by Historical Provider  Last OV 10/13/2024  Upcoming appointment 02/17/2025

## 2024-11-03 NOTE — Progress Notes (Signed)
 Evelyn Sanchez                                          MRN: [id number]   11/03/2024   The [facility] Quality Team Specialist reviewed this patient medical record for the purposes of chart review for care gap closure. The following were reviewed: chart review for care gap closure-glycemic status assessment.    [facility] Quality Team

## 2024-11-14 NOTE — Progress Notes (Signed)
 Evelyn Sanchez                                          MRN: [id number]   11/14/2024   The [facility] Quality Team Specialist reviewed this patient medical record for the purposes of chart review for care gap closure. The following were reviewed: abstraction for care gap closure-glycemic status assessment.    [facility] Quality Team

## 2024-11-20 ENCOUNTER — Other Ambulatory Visit: Payer: Self-pay

## 2024-11-20 DIAGNOSIS — F331 Major depressive disorder, recurrent, moderate: Secondary | ICD-10-CM

## 2024-11-20 MED ORDER — DULOXETINE HCL 60 MG PO CPEP: 120.0000 mg | ORAL_CAPSULE | Freq: Every day | ORAL | 1 refills | Status: AC

## 2025-02-17 ENCOUNTER — Ambulatory Visit: Admitting: Medical-Surgical
